# Patient Record
Sex: Male | Born: 1945
Health system: Southern US, Community
[De-identification: ages and names within clinical notes are randomized; demographics above are authoritative.]

## PROBLEM LIST (undated history)

## (undated) DIAGNOSIS — G600 Hereditary motor and sensory neuropathy: Secondary | ICD-10-CM

## (undated) DIAGNOSIS — K5730 Diverticulosis of large intestine without perforation or abscess without bleeding: Secondary | ICD-10-CM

## (undated) DIAGNOSIS — R972 Elevated prostate specific antigen [PSA]: Secondary | ICD-10-CM

## (undated) DIAGNOSIS — E119 Type 2 diabetes mellitus without complications: Secondary | ICD-10-CM

## (undated) DIAGNOSIS — I2510 Atherosclerotic heart disease of native coronary artery without angina pectoris: Secondary | ICD-10-CM

## (undated) DIAGNOSIS — Z860100 Personal history of colon polyps, unspecified: Secondary | ICD-10-CM

## (undated) DIAGNOSIS — E7800 Pure hypercholesterolemia, unspecified: Secondary | ICD-10-CM

## (undated) DIAGNOSIS — G4730 Sleep apnea, unspecified: Secondary | ICD-10-CM

## (undated) DIAGNOSIS — N400 Enlarged prostate without lower urinary tract symptoms: Secondary | ICD-10-CM

## (undated) DIAGNOSIS — E669 Obesity, unspecified: Secondary | ICD-10-CM

## (undated) DIAGNOSIS — S83209A Unspecified tear of unspecified meniscus, current injury, unspecified knee, initial encounter: Secondary | ICD-10-CM

## (undated) DIAGNOSIS — Z8661 Personal history of infections of the central nervous system: Secondary | ICD-10-CM

## (undated) DIAGNOSIS — K219 Gastro-esophageal reflux disease without esophagitis: Secondary | ICD-10-CM

## (undated) DIAGNOSIS — I10 Essential (primary) hypertension: Secondary | ICD-10-CM

## (undated) HISTORY — DX: Obesity, unspecified: E66.9

## (undated) HISTORY — DX: Type 2 diabetes mellitus without complications: E11.9

## (undated) HISTORY — DX: Personal history of infections of the central nervous system: Z86.61

## (undated) HISTORY — PX: UPPER GASTROINTESTINAL ENDOSCOPY: SHX188

## (undated) HISTORY — PX: CHOLECYSTECTOMY: SHX55

## (undated) HISTORY — DX: Personal history of colon polyps, unspecified: Z86.0100

## (undated) HISTORY — DX: Elevated prostate specific antigen (PSA): R97.20

## (undated) HISTORY — DX: Gastro-esophageal reflux disease without esophagitis: K21.9

## (undated) HISTORY — DX: Hereditary motor and sensory neuropathy: G60.0

## (undated) HISTORY — DX: Pure hypercholesterolemia, unspecified: E78.00

## (undated) HISTORY — DX: Personal history of colonic polyps: Z86.010

## (undated) HISTORY — DX: Atherosclerotic heart disease of native coronary artery without angina pectoris: I25.10

## (undated) HISTORY — DX: Diverticulosis of large intestine without perforation or abscess without bleeding: K57.30

## (undated) HISTORY — PX: POLYPECTOMY: SHX149

## (undated) HISTORY — DX: Benign prostatic hyperplasia without lower urinary tract symptoms: N40.0

## (undated) HISTORY — DX: Sleep apnea, unspecified: G47.30

## (undated) HISTORY — DX: Essential (primary) hypertension: I10

---

## 1985-04-05 DIAGNOSIS — Z8661 Personal history of infections of the central nervous system: Secondary | ICD-10-CM

## 1985-04-05 HISTORY — DX: Personal history of infections of the central nervous system: Z86.61

## 2000-03-14 ENCOUNTER — Other Ambulatory Visit: Admission: RE | Admit: 2000-03-14 | Discharge: 2000-03-14 | Payer: Self-pay | Admitting: Gastroenterology

## 2000-07-25 ENCOUNTER — Other Ambulatory Visit: Admission: RE | Admit: 2000-07-25 | Discharge: 2000-07-25 | Payer: Self-pay | Admitting: Urology

## 2002-12-20 ENCOUNTER — Ambulatory Visit (HOSPITAL_COMMUNITY): Admission: RE | Admit: 2002-12-20 | Discharge: 2002-12-20 | Payer: Self-pay | Admitting: *Deleted

## 2002-12-23 ENCOUNTER — Ambulatory Visit (HOSPITAL_BASED_OUTPATIENT_CLINIC_OR_DEPARTMENT_OTHER): Admission: RE | Admit: 2002-12-23 | Discharge: 2002-12-23 | Payer: Self-pay | Admitting: Pulmonary Disease

## 2002-12-23 ENCOUNTER — Encounter: Payer: Self-pay | Admitting: Pulmonary Disease

## 2003-10-08 ENCOUNTER — Ambulatory Visit (HOSPITAL_COMMUNITY): Admission: RE | Admit: 2003-10-08 | Discharge: 2003-10-08 | Payer: Self-pay | Admitting: Gastroenterology

## 2004-02-04 HISTORY — PX: COLONOSCOPY: SHX174

## 2004-02-19 ENCOUNTER — Observation Stay (HOSPITAL_COMMUNITY): Admission: RE | Admit: 2004-02-19 | Discharge: 2004-02-19 | Payer: Self-pay | Admitting: Surgery

## 2004-02-24 ENCOUNTER — Encounter: Admission: RE | Admit: 2004-02-24 | Discharge: 2004-02-24 | Payer: Self-pay | Admitting: Surgery

## 2004-06-09 ENCOUNTER — Ambulatory Visit: Payer: Self-pay | Admitting: Pulmonary Disease

## 2004-11-10 ENCOUNTER — Ambulatory Visit: Payer: Self-pay | Admitting: Pulmonary Disease

## 2004-11-30 ENCOUNTER — Ambulatory Visit: Payer: Self-pay | Admitting: Pulmonary Disease

## 2005-05-12 ENCOUNTER — Ambulatory Visit: Payer: Self-pay | Admitting: Pulmonary Disease

## 2005-05-28 ENCOUNTER — Ambulatory Visit: Payer: Self-pay | Admitting: Pulmonary Disease

## 2005-06-21 ENCOUNTER — Ambulatory Visit: Payer: Self-pay | Admitting: Pulmonary Disease

## 2005-08-04 ENCOUNTER — Encounter: Payer: Self-pay | Admitting: Internal Medicine

## 2005-08-04 ENCOUNTER — Ambulatory Visit (HOSPITAL_BASED_OUTPATIENT_CLINIC_OR_DEPARTMENT_OTHER): Admission: RE | Admit: 2005-08-04 | Discharge: 2005-08-04 | Payer: Self-pay | Admitting: Internal Medicine

## 2005-08-08 ENCOUNTER — Ambulatory Visit: Payer: Self-pay | Admitting: Internal Medicine

## 2005-08-25 ENCOUNTER — Ambulatory Visit: Payer: Self-pay | Admitting: Internal Medicine

## 2005-11-05 ENCOUNTER — Ambulatory Visit: Payer: Self-pay | Admitting: Internal Medicine

## 2005-12-13 ENCOUNTER — Ambulatory Visit: Payer: Self-pay | Admitting: Internal Medicine

## 2007-04-06 HISTORY — PX: TRIGGER FINGER RELEASE: SHX641

## 2007-06-14 DIAGNOSIS — N401 Enlarged prostate with lower urinary tract symptoms: Secondary | ICD-10-CM

## 2007-06-14 DIAGNOSIS — K219 Gastro-esophageal reflux disease without esophagitis: Secondary | ICD-10-CM | POA: Insufficient documentation

## 2007-06-14 DIAGNOSIS — I10 Essential (primary) hypertension: Secondary | ICD-10-CM | POA: Insufficient documentation

## 2007-06-14 DIAGNOSIS — N138 Other obstructive and reflux uropathy: Secondary | ICD-10-CM | POA: Insufficient documentation

## 2007-06-14 DIAGNOSIS — M1990 Unspecified osteoarthritis, unspecified site: Secondary | ICD-10-CM | POA: Insufficient documentation

## 2007-06-15 ENCOUNTER — Ambulatory Visit: Payer: Self-pay | Admitting: Pulmonary Disease

## 2007-06-15 DIAGNOSIS — D126 Benign neoplasm of colon, unspecified: Secondary | ICD-10-CM | POA: Insufficient documentation

## 2007-06-15 DIAGNOSIS — K5730 Diverticulosis of large intestine without perforation or abscess without bleeding: Secondary | ICD-10-CM | POA: Insufficient documentation

## 2007-06-15 DIAGNOSIS — G4733 Obstructive sleep apnea (adult) (pediatric): Secondary | ICD-10-CM | POA: Insufficient documentation

## 2007-06-15 DIAGNOSIS — I2510 Atherosclerotic heart disease of native coronary artery without angina pectoris: Secondary | ICD-10-CM | POA: Insufficient documentation

## 2007-06-15 DIAGNOSIS — H6690 Otitis media, unspecified, unspecified ear: Secondary | ICD-10-CM | POA: Insufficient documentation

## 2007-06-23 LAB — CONVERTED CEMR LAB
ALT: 30 units/L (ref 0–53)
AST: 27 units/L (ref 0–37)
Albumin: 4.1 g/dL (ref 3.5–5.2)
Alkaline Phosphatase: 68 units/L (ref 39–117)
BUN: 13 mg/dL (ref 6–23)
Basophils Absolute: 0 10*3/uL (ref 0.0–0.1)
Basophils Relative: 0.7 % (ref 0.0–1.0)
Bilirubin, Direct: 0.1 mg/dL (ref 0.0–0.3)
CO2: 30 meq/L (ref 19–32)
Calcium: 9.3 mg/dL (ref 8.4–10.5)
Chloride: 102 meq/L (ref 96–112)
Cholesterol: 188 mg/dL (ref 0–200)
Creatinine, Ser: 0.9 mg/dL (ref 0.4–1.5)
Eosinophils Absolute: 0.3 10*3/uL (ref 0.0–0.6)
Eosinophils Relative: 3.8 % (ref 0.0–5.0)
GFR calc Af Amer: 110 mL/min
GFR calc non Af Amer: 91 mL/min
Glucose, Bld: 112 mg/dL — ABNORMAL HIGH (ref 70–99)
HCT: 42.6 % (ref 39.0–52.0)
HDL: 32.7 mg/dL — ABNORMAL LOW (ref >39.0)
Hemoglobin: 14.5 g/dL (ref 13.0–17.0)
Hgb A1c MFr Bld: 6.1 % — ABNORMAL HIGH (ref 4.6–6.0)
LDL Cholesterol: 133 mg/dL — ABNORMAL HIGH (ref 0–99)
Lymphocytes Relative: 26.5 % (ref 12.0–46.0)
MCHC: 34 g/dL (ref 30.0–36.0)
MCV: 90.6 fL (ref 78.0–100.0)
Monocytes Absolute: 0.6 10*3/uL (ref 0.2–0.7)
Monocytes Relative: 8.2 % (ref 3.0–11.0)
Neutro Abs: 4.2 10*3/uL (ref 1.4–7.7)
Neutrophils Relative %: 60.8 % (ref 43.0–77.0)
Platelets: 275 10*3/uL (ref 150–400)
Potassium: 4.3 meq/L (ref 3.5–5.1)
RBC: 4.7 M/uL (ref 4.22–5.81)
RDW: 12.2 % (ref 11.5–14.6)
Sodium: 138 meq/L (ref 135–145)
TSH: 0.84 microintl units/mL (ref 0.35–5.50)
Total Bilirubin: 0.8 mg/dL (ref 0.3–1.2)
Total CHOL/HDL Ratio: 5.7
Total Protein: 7.1 g/dL (ref 6.0–8.3)
Triglycerides: 111 mg/dL (ref 0–149)
VLDL: 22 mg/dL (ref 0–40)
WBC: 6.9 10*3/uL (ref 4.5–10.5)

## 2007-06-28 ENCOUNTER — Ambulatory Visit (HOSPITAL_BASED_OUTPATIENT_CLINIC_OR_DEPARTMENT_OTHER): Admission: RE | Admit: 2007-06-28 | Discharge: 2007-06-28 | Payer: Self-pay | Admitting: Orthopedic Surgery

## 2008-02-05 ENCOUNTER — Telehealth (INDEPENDENT_AMBULATORY_CARE_PROVIDER_SITE_OTHER): Payer: Self-pay | Admitting: *Deleted

## 2008-04-30 ENCOUNTER — Ambulatory Visit: Payer: Self-pay | Admitting: Family Medicine

## 2008-04-30 DIAGNOSIS — G009 Bacterial meningitis, unspecified: Secondary | ICD-10-CM | POA: Insufficient documentation

## 2008-05-06 LAB — CONVERTED CEMR LAB
ALT: 26 units/L (ref 0–53)
AST: 26 units/L (ref 0–37)
Albumin: 4 g/dL (ref 3.5–5.2)
Alkaline Phosphatase: 52 units/L (ref 39–117)
BUN: 16 mg/dL (ref 6–23)
Basophils Absolute: 0 10*3/uL (ref 0.0–0.1)
Basophils Relative: 0.3 % (ref 0.0–3.0)
Bilirubin, Direct: 0.1 mg/dL (ref 0.0–0.3)
CO2: 32 meq/L (ref 19–32)
Calcium: 9.4 mg/dL (ref 8.4–10.5)
Chloride: 103 meq/L (ref 96–112)
Cholesterol: 183 mg/dL (ref 0–200)
Creatinine, Ser: 1 mg/dL (ref 0.4–1.5)
Eosinophils Absolute: 0.3 10*3/uL (ref 0.0–0.7)
Eosinophils Relative: 5.3 % — ABNORMAL HIGH (ref 0.0–5.0)
GFR calc Af Amer: 97 mL/min
GFR calc non Af Amer: 80 mL/min
Glucose, Bld: 111 mg/dL — ABNORMAL HIGH (ref 70–99)
HCT: 41.2 % (ref 39.0–52.0)
HDL: 33.7 mg/dL — ABNORMAL LOW (ref >39.0)
Hemoglobin: 14.5 g/dL (ref 13.0–17.0)
Hgb A1c MFr Bld: 6.2 % — ABNORMAL HIGH (ref 4.6–6.0)
LDL Cholesterol: 121 mg/dL — ABNORMAL HIGH (ref 0–99)
Lymphocytes Relative: 29.5 % (ref 12.0–46.0)
MCHC: 35.1 g/dL (ref 30.0–36.0)
MCV: 91.2 fL (ref 78.0–100.0)
Monocytes Absolute: 0.6 10*3/uL (ref 0.1–1.0)
Monocytes Relative: 9.5 % (ref 3.0–12.0)
Neutro Abs: 3.4 10*3/uL (ref 1.4–7.7)
Neutrophils Relative %: 55.4 % (ref 43.0–77.0)
PSA: 4.09 ng/mL — ABNORMAL HIGH (ref 0.10–4.00)
Platelets: 250 10*3/uL (ref 150–400)
Potassium: 4.2 meq/L (ref 3.5–5.1)
RBC: 4.52 M/uL (ref 4.22–5.81)
RDW: 12.5 % (ref 11.5–14.6)
Sodium: 141 meq/L (ref 135–145)
TSH: 0.87 microintl units/mL (ref 0.35–5.50)
Total Bilirubin: 0.6 mg/dL (ref 0.3–1.2)
Total CHOL/HDL Ratio: 5.4
Total Protein: 6.9 g/dL (ref 6.0–8.3)
Triglycerides: 142 mg/dL (ref 0–149)
Uric Acid, Serum: 7.6 mg/dL (ref 4.0–7.8)
VLDL: 28 mg/dL (ref 0–40)
WBC: 6.1 10*3/uL (ref 4.5–10.5)

## 2008-05-07 ENCOUNTER — Ambulatory Visit: Payer: Self-pay | Admitting: Family Medicine

## 2008-05-14 ENCOUNTER — Ambulatory Visit: Payer: Self-pay | Admitting: Family Medicine

## 2008-05-17 ENCOUNTER — Encounter: Payer: Self-pay | Admitting: Family Medicine

## 2008-05-21 ENCOUNTER — Ambulatory Visit: Payer: Self-pay | Admitting: Family Medicine

## 2008-05-28 ENCOUNTER — Ambulatory Visit: Payer: Self-pay | Admitting: Family Medicine

## 2008-06-04 ENCOUNTER — Ambulatory Visit: Payer: Self-pay | Admitting: Family Medicine

## 2008-06-11 ENCOUNTER — Ambulatory Visit: Payer: Self-pay | Admitting: Family Medicine

## 2008-06-18 ENCOUNTER — Ambulatory Visit: Payer: Self-pay | Admitting: Family Medicine

## 2008-06-25 ENCOUNTER — Ambulatory Visit: Payer: Self-pay | Admitting: Family Medicine

## 2008-07-09 ENCOUNTER — Ambulatory Visit: Payer: Self-pay | Admitting: Family Medicine

## 2008-07-30 ENCOUNTER — Ambulatory Visit: Payer: Self-pay | Admitting: Family Medicine

## 2008-08-02 ENCOUNTER — Encounter (INDEPENDENT_AMBULATORY_CARE_PROVIDER_SITE_OTHER): Payer: Self-pay | Admitting: *Deleted

## 2008-08-05 ENCOUNTER — Telehealth (INDEPENDENT_AMBULATORY_CARE_PROVIDER_SITE_OTHER): Payer: Self-pay | Admitting: *Deleted

## 2008-09-11 ENCOUNTER — Ambulatory Visit: Payer: Self-pay | Admitting: Family Medicine

## 2008-09-30 ENCOUNTER — Encounter: Payer: Self-pay | Admitting: Family Medicine

## 2008-10-15 ENCOUNTER — Ambulatory Visit: Payer: Self-pay | Admitting: Family Medicine

## 2008-11-12 ENCOUNTER — Encounter: Payer: Self-pay | Admitting: Family Medicine

## 2008-11-13 ENCOUNTER — Ambulatory Visit: Payer: Self-pay | Admitting: Family Medicine

## 2009-04-08 ENCOUNTER — Ambulatory Visit: Payer: Self-pay | Admitting: Family Medicine

## 2009-04-09 ENCOUNTER — Telehealth: Payer: Self-pay | Admitting: Family Medicine

## 2009-04-16 ENCOUNTER — Encounter: Payer: Self-pay | Admitting: Family Medicine

## 2009-04-22 ENCOUNTER — Ambulatory Visit: Payer: Self-pay | Admitting: Family Medicine

## 2009-04-23 ENCOUNTER — Telehealth (INDEPENDENT_AMBULATORY_CARE_PROVIDER_SITE_OTHER): Payer: Self-pay | Admitting: *Deleted

## 2009-04-24 ENCOUNTER — Encounter (INDEPENDENT_AMBULATORY_CARE_PROVIDER_SITE_OTHER): Payer: Self-pay | Admitting: *Deleted

## 2009-04-29 ENCOUNTER — Ambulatory Visit: Payer: Self-pay | Admitting: Gastroenterology

## 2009-04-29 ENCOUNTER — Ambulatory Visit: Payer: Self-pay | Admitting: Family Medicine

## 2009-05-19 ENCOUNTER — Ambulatory Visit: Payer: Self-pay | Admitting: Gastroenterology

## 2009-05-26 ENCOUNTER — Telehealth: Payer: Self-pay | Admitting: Family Medicine

## 2009-06-17 ENCOUNTER — Ambulatory Visit: Payer: Self-pay | Admitting: Family Medicine

## 2009-06-24 ENCOUNTER — Ambulatory Visit: Payer: Self-pay | Admitting: Family Medicine

## 2009-07-01 ENCOUNTER — Ambulatory Visit: Payer: Self-pay | Admitting: Family Medicine

## 2009-09-08 ENCOUNTER — Telehealth: Payer: Self-pay | Admitting: Pulmonary Disease

## 2009-09-09 ENCOUNTER — Ambulatory Visit: Payer: Self-pay | Admitting: Pulmonary Disease

## 2009-09-22 ENCOUNTER — Ambulatory Visit: Payer: Self-pay | Admitting: Pulmonary Disease

## 2009-09-28 LAB — CONVERTED CEMR LAB
ALT: 19 units/L (ref 0–53)
AST: 24 units/L (ref 0–37)
Albumin: 4.1 g/dL (ref 3.5–5.2)
Alkaline Phosphatase: 60 units/L (ref 39–117)
BUN: 13 mg/dL (ref 6–23)
Basophils Absolute: 0 10*3/uL (ref 0.0–0.1)
Basophils Relative: 0.7 % (ref 0.0–3.0)
Bilirubin, Direct: 0.1 mg/dL (ref 0.0–0.3)
CO2: 32 meq/L (ref 19–32)
Calcium: 9.6 mg/dL (ref 8.4–10.5)
Chloride: 104 meq/L (ref 96–112)
Cholesterol: 179 mg/dL (ref 0–200)
Creatinine, Ser: 0.8 mg/dL (ref 0.4–1.5)
Eosinophils Absolute: 0.2 10*3/uL (ref 0.0–0.7)
Eosinophils Relative: 4.4 % (ref 0.0–5.0)
GFR calc non Af Amer: 103.25 mL/min (ref >60)
Glucose, Bld: 95 mg/dL (ref 70–99)
HCT: 40.4 % (ref 39.0–52.0)
HDL: 41.1 mg/dL (ref >39.00)
Hemoglobin: 14 g/dL (ref 13.0–17.0)
LDL Cholesterol: 119 mg/dL — ABNORMAL HIGH (ref 0–99)
Lymphocytes Relative: 29.7 % (ref 12.0–46.0)
Lymphs Abs: 1.7 10*3/uL (ref 0.7–4.0)
MCHC: 34.5 g/dL (ref 30.0–36.0)
MCV: 93 fL (ref 78.0–100.0)
Monocytes Absolute: 0.5 10*3/uL (ref 0.1–1.0)
Monocytes Relative: 8.4 % (ref 3.0–12.0)
Neutro Abs: 3.2 10*3/uL (ref 1.4–7.7)
Neutrophils Relative %: 56.8 % (ref 43.0–77.0)
PSA: 3.77 ng/mL (ref 0.10–4.00)
Platelets: 194 10*3/uL (ref 150.0–400.0)
Potassium: 4.8 meq/L (ref 3.5–5.1)
RBC: 4.35 M/uL (ref 4.22–5.81)
RDW: 12.9 % (ref 11.5–14.6)
Sodium: 142 meq/L (ref 135–145)
TSH: 1.24 microintl units/mL (ref 0.35–5.50)
Total Bilirubin: 0.7 mg/dL (ref 0.3–1.2)
Total CHOL/HDL Ratio: 4
Total Protein: 6.6 g/dL (ref 6.0–8.3)
Triglycerides: 93 mg/dL (ref 0.0–149.0)
VLDL: 18.6 mg/dL (ref 0.0–40.0)
WBC: 5.7 10*3/uL (ref 4.5–10.5)

## 2009-10-08 ENCOUNTER — Encounter: Payer: Self-pay | Admitting: Pulmonary Disease

## 2009-10-22 ENCOUNTER — Telehealth: Payer: Self-pay | Admitting: Gastroenterology

## 2009-10-22 ENCOUNTER — Encounter: Payer: Self-pay | Admitting: Pulmonary Disease

## 2010-05-05 NOTE — Progress Notes (Signed)
  Phone Note Refill Request   Refills Requested: Medication #1:  LISINOPRIL 10 MG TABS 1 by mouth once daily    New/Updated Medications: LISINOPRIL 20 MG TABS (LISINOPRIL) 1 by mouth daily. Prescriptions: LISINOPRIL 20 MG TABS (LISINOPRIL) 1 by mouth daily.  #30 x 0   Entered by:   Army Fossa CMA   Authorized by:   Loreen Freud DO   Signed by:   Army Fossa CMA on 04/23/2009   Method used:   Electronically to        [facility]. [phone]* (retail)       [address]       [place], [place]  [address]       Ph: [phone]       Fax: [phone]   RxID:   [id number]

## 2010-05-05 NOTE — Procedures (Addendum)
Summary: Colonoscopy  Patient: Ralph Garrett Note: All result statuses are Final unless otherwise noted.  Tests: (1) Colonoscopy (COL)   COL Colonoscopy           DONE     [facility]     [address]     [place], [place]  [address]           COLONOSCOPY PROCEDURE REPORT           PATIENT:  [name], [name]  MR#:  [id number]     BIRTHDATE:  [birth year], 63 yrs. old  GENDER:  male           ENDOSCOPIST:  Vania Rea. Jarold Motto, MD, [facility]     Referred by:           PROCEDURE DATE:  05/19/2009     PROCEDURE:  Colonoscopy with snare polypectomy     ASA CLASS:  Class III     INDICATIONS:  history of pre-cancerous (adenomatous) colon polyps                 MEDICATIONS:   Fentanyl 50 mcg IV, Versed 7 mg IV           DESCRIPTION OF PROCEDURE:   After the risks benefits and     alternatives of the procedure were thoroughly explained, informed     consent was obtained.  Digital rectal exam was performed and     revealed no abnormalities.   The LB [id number] endoscope     was introduced through the anus and advanced to the cecum, which     was identified by both the appendix and ileocecal valve, limited     by poor preparation.  EXTREMELY POOR PREP.OVER1 LITER OF STOOL     ASPIRATED AND ANOTHER LITER PRESENT.  The quality of the prep was     poor, using MoviPrep.  The instrument was then slowly withdrawn as     the colon was fully examined.     <<PROCEDUREIMAGES>>           FINDINGS:  ENDOSCOPIC FINDINGS:   A sessile polyp was found at the     hepatic flexure.  CM FLAT POLYP SNARE EXCISED AND LOST IN POOR     PREP. Severe diverticulosis was found sigmoid to descending     Retroflexed views in the rectum revealed no abnormalities.    The     scope was then withdrawn from the patient and the procedure     completed.           COMPLICATIONS:  None           ENDOSCOPIC IMPRESSION:     1) Severe diverticulosis in the sigmoid to descending     2) Sessile polyp at the hepatic  flexure     LESS THAN 50% CONFIDENCE VISUALIZATION RELIANCE.     RECOMMENDATIONS:     1) high fiber diet     F/U 1 YEAR WITH DOUBLE PREP.           REPEAT EXAM:  No           ______________________________     Vania Rea. Jarold Motto, MD, [facility]           CC:           n.     eSIGNED:   Vania Rea. Patterson at 05/19/2009 10:20 AM           Dennison Nancy, [phone]  Note: An  exclamation mark (!) indicates a result that was not dispersed into the flowsheet. Document Creation Date: 05/19/2009 10:20 AM _______________________________________________________________________  (1) Order result status: Final Collection or observation date-time: 05/19/2009 10:12 Requested date-time:  Receipt date-time:  Reported date-time:  Referring Physician:   Ordering Physician: Sheryn Bison [phone]) Specimen Source:  Source: Launa Grill Order Number: [id number] Lab site:   Appended Document: Colonoscopy     Procedures Next Due Date:    Colonoscopy: 05/2010  Appended Document: Colonoscopy he needs colonoscopy with osmo...prep  Appended Document: Colonoscopy spoke to wife and she will have him call back to schedule the colonoscopy...Marland Kitchenhe will need osmo prep.

## 2010-05-05 NOTE — Progress Notes (Signed)
Summary: lab & rx  Phone Note Call from Patient Call back at Home Phone [phone]   Caller: Patient Call For: Lameisha Schuenemann Reason for Call: Talk to Nurse Summary of Call: pt wants to come in for bloodwork.  Has appt schedulled for 06/20.  Pt would like to come in 06/07.  Also, need rx for his Lisinopril called in  to [facility] Initial call taken by: Eugene Gavia,  September 08, 2009 11:02 AM  Follow-up for Phone Call        Please advise on labs to enter. Carron Curie CMA  September 08, 2009 11:54 AM   per SN---ok for labs---lip-272.0/bmp-401.9/hepat-790.5/cbcd-285.9/tsh-244.9/psa-v76.44.  labs are in computer for pt to come in on 6-7 per pts request.  called and spoke with pt and he is aware and we sent in refill of the lisinopril to last until his appt with SN. Randell Loop CMA  September 08, 2009 1:45 PM     Prescriptions: LISINOPRIL 20 MG TABS (LISINOPRIL) 1 by mouth daily. NEEDS OFFICE VISIT BEFORE ADDITIONAL REFILLS. Brand medically necessary #30 Tablet x 0   Entered by:   Randell Loop CMA   Authorized by:   Michele Mcalpine MD   Signed by:   Randell Loop CMA on 09/08/2009   Method used:   Electronically to        [facility]. [phone]* (retail)       [address]       [place], [place]  [address]       Ph: [phone]       Fax: [phone]   RxID:   [id number]

## 2010-05-05 NOTE — Progress Notes (Signed)
Summary: EGD recall project   ---- Converted from flag ---- ---- 10/22/2009 12:25 PM, Mardella Layman MD [facility] wrote: cx egd  ---- 10/22/2009 11:39 AM, Harlow Mares CMA (AAMA) wrote: Patient has a recall EGD in the system from 2010, I see you did a colonoscopy on him in 2011 without mention of doing an EGD. Do you wish for him to have an EGD still?  He is part of the recall project  leisha ------------------------------

## 2010-05-05 NOTE — Assessment & Plan Note (Signed)
Summary: cpx/ mbw   Primary Care Provider:  Kriste Basque  CC:  2 year ROV & CPX....  History of Present Illness: 65 y/o WM here for a follow up visit and CPX...   ~  September 22, 2009:  last seen 3/09  but he participated in the Optifast program 2010 w/ CPX DrLowne at that time> he lost 52# from 307# to 255# & has kept it off... he has since had some problems w/ his CPAP & I suspect interface issues due to wt loss (I have encouraged f/u w/ DrYoung- he may need repeat split night study)... BP controlled on meds;  Chol improved w/ weight reduction;  followed by Urology- DrPeterson Q107mo on Avodart;  finally he notes prob CMT neuropathy w/ some feet & legs symptoms that run in the family...   Current Problems:  Hx of OTITIS MEDIA (ICD-382.9) - hospitalized in 1987 w/ a URI, OM, and meningitis secondary to strep pneumoniae... left myringotomy tube placed by DrKraus...  OBSTRUCTIVE SLEEP APNEA (ICD-327.23) - sleep study 5/07 w/ RDI=90 & desat to 81%... sleep consult by DrYoung, stable on CPAP 14, nasal mask... he has not been successful in weight loss attempts.  HYPERTENSION (ICD-401.9) - controlled on LISINOPRIL 20mg /d... BP=110/72, doing well... denies HA, fatigue, visual changes, CP, palipit, dizziness, syncope, dyspnea, edema, etc...  CORONARY ARTERY DISEASE (ICD-414.00) - known non-obstructive CAD w/ cath 9/04 (after a non-dx Cardiolite) showing 20-30% lesions only & EF=55-60%.  HYPERCHOLESTEROLEMIA (ICD-272.0) - on diet Rx alone...  ~  FLP 2/07 showed TChol 180, TG 104, HDL 33, LDL 126  ~  FLP 3/09 (wt=307#) showed TChol 188, TG 111, HDL 33, LDL 133  ~  FLP 6/11 (wt=255#) showed TChol 179, TG 93, HDL 41, LDL 119... continue diet efforts.  OBESITY (ICD-278.00) - prev weight up to 307# in his 6'1" frame for a BMI=40-41  ~  weight 6/11 = 255# after Optifast program in 2010  GERD (ICD-530.81) - EGD 8/05 w/ gastritis... he had gallstones too at that time and improved post  cholecystectomy...  DIVERTICULOSIS OF COLON (ICD-562.10) - colonoscopy was 6/05 DrPatterson showing divertics & polyps (hyperplastic)... last tubular adenoma removed 2001... f/u colonoscopy 2/11 showed divertics & sessile polyp, poor prep. COLONIC POLYPS (ICD-211.3)  BENIGN PROSTATIC HYPERTROPHY, HX OF (ICD-V13.8) - Eval and Rx by DrPeterson w/ biopsiesx4 in the past were neg per pt's history and large prostate treated w/ AVODART... urology checks him Q15months... PSA, INCREASED (ICD-790.93)  DEGENERATIVE JOINT DISEASE (ICD-715.90)  Hx of BACTERIAL MENINGITIS (ICD-320.9) - he had suppurative OM w/ meningitis in 1987 & made a full recovery...  R/O PERONEAL MUSCULAR ATROPHY (ICD-356.1) - high arches and ? FamHx for poss CMT neuropathy... denies motor problems, difficulty w/ ambulations, falling, etc...   Preventive Screening-Counseling & Management  Alcohol-Tobacco     Smoking Status: quit     Year Quit: 1996  Allergies: 1)  ! Penicillin  Comments:  Nurse/Medical Assistant: The patient's medications and allergies were reviewed with the patient and were updated in the Medication and Allergy Lists.  Past History:  Past Medical History: Hx of OTITIS MEDIA (ICD-382.9) OBSTRUCTIVE SLEEP APNEA (ICD-327.23) HYPERTENSION (ICD-401.9) CORONARY ARTERY DISEASE (ICD-414.00) HYPERCHOLESTEROLEMIA (ICD-272.0) OBESITY (ICD-278.00) GERD (ICD-530.81) DIVERTICULOSIS OF COLON (ICD-562.10) COLONIC POLYPS (ICD-211.3) BENIGN PROSTATIC HYPERTROPHY, HX OF (ICD-V13.8) PSA, INCREASED (ICD-790.93) DEGENERATIVE JOINT DISEASE (ICD-715.90) Hx of BACTERIAL MENINGITIS (ICD-320.9) R/O PERONEAL MUSCULAR ATROPHY (ICD-356.1)  Past Surgical History: S/P laparoscopic cholecystectomy - 11/05 by DrStreck Trigger finger-R hand 2009  [place]  Family History: Reviewed  history from 04/30/2008 and no changes required. Family History of Stroke M 1st degree relative <50 Family History of CAD Male 1st degree  relative 57s Family History Lung cancer B at 66 yo brain tumor  Family History Diabetes 1st degree relative M-COPD Family History High cholesterol Family History Hypertension  Social History: Reviewed history from 04/30/2008 and no changes required. Occupation: Licensed conveyancer, traveling Married Former Smoker Alcohol use-yes Drug use-no Regular exercise-no  Review of Systems       The patient complains of sleep disorder.  The patient denies fever, chills, sweats, anorexia, fatigue, weakness, malaise, weight loss, blurring, diplopia, eye irritation, eye discharge, vision loss, eye pain, photophobia, earache, ear discharge, tinnitus, decreased hearing, nasal congestion, nosebleeds, sore throat, hoarseness, chest pain, palpitations, syncope, dyspnea on exertion, orthopnea, PND, peripheral edema, cough, dyspnea at rest, excessive sputum, hemoptysis, wheezing, pleurisy, nausea, vomiting, diarrhea, constipation, change in bowel habits, abdominal pain, melena, hematochezia, jaundice, gas/bloating, indigestion/heartburn, dysphagia, odynophagia, dysuria, hematuria, urinary frequency, urinary hesitancy, nocturia, incontinence, back pain, joint pain, joint swelling, muscle cramps, muscle weakness, stiffness, arthritis, sciatica, restless legs, leg pain at night, leg pain with exertion, rash, itching, dryness, suspicious lesions, paralysis, paresthesias, seizures, tremors, vertigo, transient blindness, frequent falls, frequent headaches, difficulty walking, depression, anxiety, memory loss, confusion, cold intolerance, heat intolerance, polydipsia, polyphagia, polyuria, unusual weight change, abnormal bruising, bleeding, enlarged lymph nodes, urticaria, allergic rash, hay fever, and recurrent infections.    Vital Signs:  Patient profile:   65 year old male Height:      74.5 inches Weight:      255 pounds BMI:     32.42 O2 Sat:      98 % on Room air Temp:     97.6 degrees F oral Pulse rate:   50  / minute BP sitting:   122 / 72  (left arm) Cuff size:   regular  Vitals Entered By: Randell Loop CMA (September 22, 2009 11:13 AM)  O2 Sat at Rest %:  98 O2 Flow:  Room air CC: 2 year ROV & CPX... Is Patient Diabetic? No Pain Assessment Patient in pain? yes      Onset of pain  feet hurt when waking barefoot on floors Comments meds updated today with pt   Physical Exam  Additional Exam:  WD, Obese, 65 y/o WM in NAD... GENERAL:  Alert & oriented; pleasant & cooperative... HEENT:  [place]/AT, EOM-wnl, PERRLA, Fundi-benign, EACs-clear, TMs-wnl, NOSE-clear, THROAT-clear & wnl. NECK:  Supple w/ full ROM; no JVD; normal carotid impulses w/o bruits; no thyromegaly or nodules palpated; no lymphadenopathy. CHEST:  Clear to P & A; without wheezes/ rales/ or rhonchi heard... HEART:  Regular Rhythm; without murmurs/ rubs/ or gallops detected... ABDOMEN:  Obese, soft & nontender; normal bowel sounds; no organomegaly or masses detected. EXT: without deformities, mild arthritic changes; no varicose veins/ +venous insuffic/ no edema. NEURO:  CN's intact; motor testing normal; sensory testing normal; gait normal & balance OK. DERM:  No lesions noted; no rash etc...    CXR  Procedure date:  09/22/2009  Findings:      CHEST - 2 VIEW Comparison: 06/15/2007.   Findings: Normal cardiomediastinal silhouette.  COPD. Emphysematous changes upper lung zones/apices.  Bony hypertrophic changes of the thoracic spine.Nodular opacity right paravertebral region of the mid chest compatible with bony hypertrophy at the costovertebral junction.   IMPRESSION: COPD.  No acute chest findings.  No interval change.   Read By:  Jonne Ply,  M.D.   EKG  Procedure date:  09/22/2009  Findings:      Sinus bradycardia with rate of:  54/min... Tracing shows LAD, IVCD, otherw neg...  SN   MISC. Report  Procedure date:  09/09/2009  Findings:      BMP (METABOL)   Sodium                    142 mEq/L                    135-145   Potassium                 4.8 mEq/L                   3.5-5.1   Chloride                  104 mEq/L                   96-112   Carbon Dioxide            32 mEq/L                    19-32   Glucose                   95 mg/dL                    04-54   BUN                       13 mg/dL                    0-98   Creatinine                0.8 mg/dL                   1.1-9.1   Calcium                   9.6 mg/dL                   4.7-82.9   GFR                       103.25 mL/min               >60  Lipid Panel (LIPID)   Cholesterol               179 mg/dL                   5-621   Triglycerides             93.0 mg/dL                  3.0-865.7   HDL                       84.69 mg/dL                 >62.95   LDL Cholesterol      [H]  284 mg/dL                   1-32   CBC Platelet w/Diff (CBCD)   White Cell Count          5.7 K/uL  4.5-10.5   Red Cell Count            4.35 Mil/uL                 4.22-5.81   Hemoglobin                14.0 g/dL                   57.8-46.9   Hematocrit                40.4 %                      39.0-52.0   MCV                       93.0 fl                     78.0-100.0   Platelet Count            194.0 K/uL                  150.0-400.0   Neutrophil %              56.8 %                      43.0-77.0   Lymphocyte %              29.7 %                      12.0-46.0   Monocyte %                8.4 %                       3.0-12.0   Eosinophils%              4.4 %                       0.0-5.0   Basophils %               0.7 %   Comments:      Hepatic/Liver Function Panel (HEPATIC)   Total Bilirubin           0.7 mg/dL                   6.2-9.5   Direct Bilirubin          0.1 mg/dL                   2.8-4.1   Alkaline Phosphatase      60 U/L                      39-117   AST                       24 U/L                      0-37   ALT                       19 U/L                      0-53  Total Protein              6.6 g/dL                    1.6-1.0   Albumin                   4.1 g/dL                    9.6-0.4   TSH (TSH)   FastTSH                   1.24 uIU/mL                 0.35-5.50  Prostate Specific Antigen (PSA)   PSA-Hyb                   3.77 ng/mL                  0.10-4.00   Impression & Recommendations:  Problem # 1:  OBSTRUCTIVE SLEEP APNEA (ICD-327.23)  Needs f/u Sleep eval ?split night study? new mask since he lost weight, etc...   Orders: DME Referral (DME)  Problem # 2:  HYPERTENSION (ICD-401.9) Controlled w/ Lisinopril & weight reduction... His updated medication list for this problem includes:    Lisinopril 20 Mg Tabs (Lisinopril) .Marland Kitchen... Take 1 tab by mouth once daily...  Problem # 3:  CORONARY ARTERY DISEASE (ICD-414.00) Continue risk factor reduction strategy... His updated medication list for this problem includes:    Bayer Low Strength 81 Mg Tbec (Aspirin) .Marland Kitchen... Take 1 tab once daily    Lisinopril 20 Mg Tabs (Lisinopril) .Marland Kitchen... Take 1 tab by mouth once daily...  Problem # 4:  HYPERCHOLESTEROLEMIA (ICD-272.0) Improved w/ weight reduction... discussed low chol/ low fat...  Problem # 5:  OBESITY (ICD-278.00) Great job keeping the weight off...  Problem # 6:  COLONIC POLYPS (ICD-211.3) He had colonoscopy 2/11> poor prep but +Divertics and sessile polyp... SEE REPORT.  Problem # 7:  BENIGN PROSTATIC HYPERTROPHY, HX OF (ICD-V13.8) Followed by DrPeterson Q50mo he says...  Problem # 8:  OTHER MEDICAL PROBLEMS AS NOTED>>>  Complete Medication List: 1)  Cpap 14 Cm  .... At bedtime 2)  Bayer Low Strength 81 Mg Tbec (Aspirin) .... Take 1 tab once daily 3)  Lisinopril 20 Mg Tabs (Lisinopril) .... Take 1 tab by mouth once daily.Marland KitchenMarland Kitchen 4)  Avodart 0.5 Mg Caps (Dutasteride) .... Take 1 tablet by mouth once a day 5)  Benadryl 25 Mg Caps (Diphenhydramine hcl) .... At bedtime  Other Orders: EKG w/ Interpretation (93000) T-2 View CXR (71020TC)  Patient Instructions: 1)   Today we updated your med list- see below.... 2)  We refilled your Lisinopril perscription.Marland KitchenMarland Kitchen 3)  Keep up the great job w/ weight reduction.Marland KitchenMarland Kitchen 4)  Today we did your follow up CXR & EKG... please call the "phone tree" in a few days for your lab results.Marland KitchenMarland Kitchen 5)  Call for any problems.Marland KitchenMarland Kitchen 6)  Please schedule a follow-up appointment in 1 year. Prescriptions: LISINOPRIL 20 MG TABS (LISINOPRIL) take 1 tab by mouth once daily... Brand medically necessary #90 x 4   Entered and Authorized by:   Michele Mcalpine MD   Signed by:   Michele Mcalpine MD on 09/22/2009   Method used:   Print then Give to Patient   RxID:   [id number]    [facility] ECG  ID: [id number] Patient: Ralph Garrett, Ralph Garrett DOB: 1945/07/30 Age:  65 Years Old Sex: Male Race: White Physician: Zilda No Technician: Randell Loop CMA Height: 74.5 Weight: 255 Status: Unconfirmed Past Medical History:   Hx of OTITIS MEDIA (ICD-382.9) OBSTRUCTIVE SLEEP APNEA (ICD-327.23) HYPERTENSION (ICD-401.9) CORONARY ARTERY DISEASE (ICD-414.00) HYPERCHOLESTEROLEMIA (ICD-272.0) OBESITY (ICD-278.00) GERD (ICD-530.81) DIVERTICULOSIS OF COLON (ICD-562.10) COLONIC POLYPS (ICD-211.3) BENIGN PROSTATIC HYPERTROPHY, HX OF (ICD-V13.8) PSA, INCREASED (ICD-790.93) DEGENERATIVE JOINT DISEASE (ICD-715.90)   Recorded: 09/22/2009 11:29 AM P/PR: 125 ms / 172 ms - Heart rate (maximum exercise) QRS: 125 QT/QTc/QTd: 436 ms / 426 ms / 38 ms - Heart rate (maximum exercise)  P/QRS/T axis: 54 deg / -76 deg / 36 deg - Heart rate (maximum exercise)  Heartrate: 54 bpm  Interpretation:  Sinus bradycardia with rate of:  54/min... Tracing shows LAD, IVCD, otherw neg...  SN

## 2010-05-05 NOTE — Progress Notes (Signed)
Summary: Refill Request  Phone Note Refill Request Message from:  Pharmacy on [facility] Fax #: [phone]  Refills Requested: Medication #1:  LISINOPRIL 20 MG TABS 1 by mouth daily.   Dosage confirmed as above?Dosage Confirmed   Supply Requested: 1 month   Last Refilled: 04/23/2009 Initial call taken by: Harold Barban,  May 26, 2009 8:51 AM  Follow-up for Phone Call        has not been seen by you since 04/30/08- but is doing optifast. Army Fossa CMA  May 26, 2009 9:06 AM   Additional Follow-up for Phone Call Additional follow up Details #1::        refill x1-- pt has not been seen in office since 04/2008--needs ov --optifast visit  is not the same Additional Follow-up by: Loreen Freud DO,  May 26, 2009 12:09 PM    New/Updated Medications: LISINOPRIL 20 MG TABS (LISINOPRIL) 1 by mouth daily. NEEDS OFFICE VISIT BEFORE ADDITIONAL REFILLS. Prescriptions: LISINOPRIL 20 MG TABS (LISINOPRIL) 1 by mouth daily. NEEDS OFFICE VISIT BEFORE ADDITIONAL REFILLS.  #30 x 0   Entered by:   Army Fossa CMA   Authorized by:   Loreen Freud DO   Signed by:   Army Fossa CMA on 05/26/2009   Method used:   Electronically to        [facility]. [phone]* (retail)       [address]       [place], [place]  [address]       Ph: [phone]       Fax: [phone]   RxID:   [id number]

## 2010-05-05 NOTE — Letter (Signed)
Summary: [facility] Instructions  [facility]  [address] [place], [place] [address]   Phone: [phone]  Fax: [phone]       Ralph Garrett    09/04/1945    MRN: [id number]        Procedure Day /Date:05/19/09  Monday     Arrival Time: 8:30am     Procedure Time:  9:30am     Location of Procedure:                    _x _   [facility]oscopy Center (4th Floor)                        PREPARATION FOR COLONOSCOPY WITH MOVIPREP   Starting 5 days prior to your procedure _2/9/11 _ do not eat nuts, seeds, popcorn, corn, beans, peas,  salads, or any raw vegetables.  Do not take any fiber supplements (e.g. Metamucil, Citrucel, and Benefiber).  THE DAY BEFORE YOUR PROCEDURE         DATE:  05/18/09  DAY:  Sunday  1.  Drink clear liquids the entire day-NO SOLID FOOD  2.  Do not drink anything colored red or purple.  Avoid juices with pulp.  No orange juice.  3.  Drink at least 64 oz. (8 glasses) of fluid/clear liquids during the day to prevent dehydration and help the prep work efficiently.  CLEAR LIQUIDS INCLUDE: Water Jello Ice Popsicles Tea (sugar ok, no milk/cream) Powdered fruit flavored drinks Coffee (sugar ok, no milk/cream) Gatorade Juice: apple, white grape, white cranberry  Lemonade Clear bullion, consomm, broth Carbonated beverages (any kind) Strained chicken noodle soup Hard Candy                             4.  In the morning, mix first dose of MoviPrep solution:    Empty 1 Pouch A and 1 Pouch B into the disposable container    Add lukewarm drinking water to the top line of the container. Mix to dissolve    Refrigerate (mixed solution should be used within 24 hrs)  5.  Begin drinking the prep at 5:00 p.m. The MoviPrep container is divided by 4 marks.   Every 15 minutes drink the solution down to the next mark (approximately 8 oz) until the full liter is complete.   6.  Follow completed prep with 16 oz of clear liquid of your choice (Nothing  red or purple).  Continue to drink clear liquids until bedtime.  7.  Before going to bed, mix second dose of MoviPrep solution:    Empty 1 Pouch A and 1 Pouch B into the disposable container    Add lukewarm drinking water to the top line of the container. Mix to dissolve    Refrigerate  THE DAY OF YOUR PROCEDURE      DATE:  05/19/09  DAY:  Monday  Beginning at 4:30 a.m. (5 hours before procedure):         1. Every 15 minutes, drink the solution down to the next mark (approx 8 oz) until the full liter is complete.  2. Follow completed prep with 16 oz. of clear liquid of your choice.    3. You may drink clear liquids until  7:30am  (2 HOURS BEFORE PROCEDURE).   MEDICATION INSTRUCTIONS  Unless otherwise instructed, you should take regular prescription medications with a small sip of water   as  early as possible the morning of your procedure.    Additional medication instructions: Be sure to take Lisinopril the morning of procedure.         OTHER INSTRUCTIONS  You will need a responsible adult at least 66 years of age to accompany you and drive you home.   This person must remain in the waiting room during your procedure.  Wear loose fitting clothing that is easily removed.  Leave jewelry and other valuables at home.  However, you may wish to bring a book to read or  an iPod/MP3 player to listen to music as you wait for your procedure to start.  Remove all body piercing jewelry and leave at home.  Total time from sign-in until discharge is approximately 2-3 hours.  You should go home directly after your procedure and rest.  You can resume normal activities the  day after your procedure.  The day of your procedure you should not:   Drive   Make legal decisions   Operate machinery   Drink alcohol   Return to work  You will receive specific instructions about eating, activities and medications before you leave.    The above instructions have been reviewed  and explained to me by  Wyona Almas RN  April 29, 2009 11:36 AM     I fully understand and can verbalize these instructions _____________________________ Date _________

## 2010-05-05 NOTE — Miscellaneous (Signed)
Summary: [facility] Previsit/prep  Clinical Lists Changes  Medications: Added new medication of MOVIPREP 100 GM  SOLR (PEG-KCL-NACL-NASULF-NA ASC-C) As per prep instructions. - Signed Rx of MOVIPREP 100 GM  SOLR (PEG-KCL-NACL-NASULF-NA ASC-C) As per prep instructions.;  #1 x 0;  Signed;  Entered by: Wyona Almas RN;  Authorized by: Mardella Layman MD [facility];  Method used: Electronically to [id number]*, [address] [place], [place], [place]  [address], Ph: [phone], Fax: [phone] Observations: Added new observation of ALLERGY REV: Done (04/29/2009 10:18)    Prescriptions: MOVIPREP 100 GM  SOLR (PEG-KCL-NACL-NASULF-NA ASC-C) As per prep instructions.  #1 x 0   Entered by:   Wyona Almas RN   Authorized by:   Mardella Layman MD [facility]   Signed by:   Wyona Almas RN on 04/29/2009   Method used:   Electronically to        [facility] [phone]* (retail)       [address]       [place], [place]  [address]       Ph: [phone]       Fax: [phone]   RxID:   [id number]

## 2010-05-05 NOTE — Miscellaneous (Signed)
Summary: Orders Update-Auto titration orders   Clinical Lists Changes  Orders: Added new Referral order of DME Referral (DME) - Signed      Mardelle Matte called the office stating that the pt was complaining of pressure issues with CPAP and machine is old. Mardelle Matte requested Auto Titration be done and SN gave verbal.[facility] CMA  October 08, 2009 3:50 PM

## 2010-05-05 NOTE — Progress Notes (Signed)
Summary: Lisinopril (lmom 1/5)  Phone Note Call from Patient   Summary of Call: Pt called and left a voicemail stating that you told him you would refill his Lisnopril, on 03/27/09 Dr.Nadel's office denied this med and stated he must make an appointment. We refilled the med on 11/12/2008. Is this fine to send in for him. Pharm- [facility].  Initial call taken by: Army Fossa CMA,  April 09, 2009 9:39 AM  Follow-up for Phone Call        I told him we would refill  --- pt should make f/u appoint here during day---his regular dr is Dr Lennon Alstrom but pt states he has been out of office--  please find out if he is back --if he is not pt needs to f/u here otherwise he can f/u with Dr Lennon Alstrom if he prefers. Follow-up by: Loreen Freud DO,  April 09, 2009 10:15 AM  Additional Follow-up for Phone Call Additional follow up Details #1::        Dr.Nadel is back in the office. I left a message for the pt to call us back. Army Fossa CMA  April 09, 2009 10:22 AM     Additional Follow-up for Phone Call Additional follow up Details #2::    pts wife is aware that meds are called in and pt needs to follow up.  Follow-up by: Army Fossa CMA,  April 09, 2009 12:20 PM  Prescriptions: LISINOPRIL 10 MG TABS (LISINOPRIL) 1 by mouth once daily  #30 x 2   Entered and Authorized by:   Loreen Freud DO   Signed by:   Loreen Freud DO on 04/09/2009   Method used:   Electronically to        [facility] [phone]* (retail)       [address]       [place], [place]  [address]       Ph: [phone]       Fax: [phone]   RxID:   [id number]

## 2010-05-20 ENCOUNTER — Encounter: Payer: Self-pay | Admitting: Gastroenterology

## 2010-05-27 NOTE — Letter (Signed)
Summary: Colonoscopy Letter  [facility]  [address]   [place], [place] [address]   Phone: [phone]  Fax: [phone]      May 20, 2010 MRN: [id number]   [facility] [address] [place], [place]  [address]   Dear Mr. Traynor,   According to your medical record, it is time for you to schedule a Colonoscopy. The American Cancer Society recommends this procedure as a method to detect early colon cancer. Patients with a family history of colon cancer, or a personal history of colon polyps or inflammatory bowel disease are at increased risk.  This letter has been generated based on the recommendations made at the time of your procedure. If you feel that in your particular situation this may no longer apply, please contact our office.  Please call our office at [phone] to schedule this appointment or to update your records at your earliest convenience.  Thank you for cooperating with [place] to provide you with the very best care possible.   Sincerely,   Sheryn Bison, M.D.  [facility] [phone]

## 2010-08-18 NOTE — Op Note (Signed)
Ralph Garrett, Ralph Garrett               ACCOUNT NO.:  [id number]   MEDICAL RECORD NO.:  [id number]          PATIENT TYPE:  AMB   LOCATION:  DSC                          FACILITY:  [facility]   PHYSICIAN:  Artist Pais. Weingold, M.D.DATE OF BIRTH:  [birth year]   DATE OF PROCEDURE:  06/28/2007  DATE OF DISCHARGE:                               OPERATIVE REPORT   PREOPERATIVE DIAGNOSIS:  Chronic right long finger stenosing  tenosynovitis.   POSTOPERATIVE DIAGNOSIS:  Chronic right long finger stenosing  tenosynovitis.   PROCEDURE:  Right long finger A1 pulley release.   SURGEON:  Artist Pais. Mina Marble, M.D.   ASSISTANT:  None.   ANESTHESIA:  Monitored anesthesia care with 0.25% plain Marcaine 5-mL  sheath block performed by the surgeon.   TOURNIQUET TIME:  10 minutes.   No complications.  No drains.   OPERATIVE REPORT:  The patient was taken to the operating suite after  the induction of adequate IV sedation.  The right upper extremity was  prepped and draped in a sterile fashion.  An Esmarch was used was  exsanguinate the limb.  Tourniquet was then inflated to 250 mmHg.  At  this point in time 5 mL of 0.25% plain Marcaine was injected into the A1  pulley area of the right long finger.  Once anesthesia was obtained, an  incision was made in the palmar crease in the A1 pulley obliquely.  Skin  was incised.  The neurovascular was carefully identified and retracted.  The proximal edge of the A1 pulley was identified and split with a 15  blade.  The FDS and FDP tendons were lysed of all adhesions.  The wound  was irrigated and loosely closed with 4-0 Vicryl Rapide suture.  Xeroform, 4x4s and a compressive wrap were applied.  The patient  tolerated the procedure well and went to recovery room in stable  fashion.      Artist Pais Mina Marble, M.D.  Electronically Signed     MAW/MEDQ  D:  06/28/2007  T:  06/28/2007  Job:  [id number]

## 2010-08-21 NOTE — Assessment & Plan Note (Signed)
[facility]                               PULMONARY OFFICE NOTE   JONERIC, STREIGHT                      MRN:          [id number]  DATE:12/13/2005                            DOB:          20-Jul-1945    PULMONARY SLEEP FOLLOWUP.   PROBLEM:  1. Obstructive sleep apnea.  2. Exogenous obesity.  3. Esophageal reflux.  4. Benign prostatic hypertrophy.   PRIMARY PHYSICIAN:  Lonzo Cloud. Kriste Basque, MD   HISTORY:  We turned his CPAP down to 14.  He still experiences more sneezing  if he wears his CPAP every night and we are not sure what the connection is.  He is very sensitive to air conditioning and drafts.  His wife wants the  room cooler and it is important enough that she now sleeps upstairs so that  he can avoid the air conditioning by sleeping downstairs.  He is comfortable  with the air conditioning off but sneezes when it is turned on, even in his  car.  He still rolls around in his sleep and wants to be able to sleep on  his back.  He sees that as the measure of success for CPAP but he admits he  feels more rested and he is able to wear the CPAP mask most of each night  now.  Astelin over-dried him and he disliked it.  Singulair has decreased  his rhinitis at least some.  Ipratropium nasal spray may have been some help  for watery nose on an occasional basis.   OBJECTIVE:  Weight 296 pounds, BP 126/72, pulse regular, 85, room air  saturation 96%.  He has not lost weight.  His nasal mucosa is normal.  There  are no pressure marks on his face from the mask.  I do not see evidence of  postnasal drainage or irritation.   IMPRESSION:  1. Obstructive sleep apnea under fair control with CPAP of 14.  2. Vasomotor rhinitis.   PLAN:  Will give him a prescription so that he can have his DRS home care  try one more pressure reduction down to a CPAP of 13 CWP.  If that is not  tolerated, they will go up to 14.  They can also try a chin strap.  We again  discussed comfort measures and weight loss.  He is at a point now where I  think he wants to be left alone to manage as well as he can so I have agreed  to see him in a year, earlier p.r.n.                                  Clinton D. Maple Hudson, MD, FCCP, FACP   CDY/MedQ  DD:  12/13/2005  DT:  12/14/2005  Job #:  [id number]

## 2010-08-21 NOTE — Assessment & Plan Note (Signed)
Ralph Garrett                               PULMONARY OFFICE NOTE   TARUS, BRISKI                      MRN:          [id number]  DATE:11/05/2005                            DOB:          06-18-1945    PROBLEM LIST:  1.  Obstructive sleep apnea.  2.  Exogenous obesity.  3.  Esophageal reflux.  4.  Benign prostatic hypertrophy.   HISTORY:  He has not had an easy path trying to get settled in with CPAP.  His pressure is at 15 CWP through [facility]. He has gone through three different  machines and four different masks.  His primary complaint is of watery nasal  congestion which he says was not a problem before he began CPAP.  It has not  been much affected by the humidifier.  He is using over-the-counter  loratadine. He admits there is less wake-up at night on CPAP and implies  that he is resting better and not being told that he snores through the  machine.   MEDICATIONS:  Aspirin 325 mg, lisinopril, Avodart, over-the-counter  Prilosec, CPAP 15 CWP, Rhinocort AQ,  loratadine or temazepam.   ALLERGIES:  Drug intolerance to PENICILLIN.   OBJECTIVE:  VITAL SIGNS:  Weight 293 pounds. Blood pressure 116/76, pulse  112, room air saturation 94%.  GENERAL APPEARANCE:  This is an alert, oriented, obese gentleman with thick  neck. There are no pressure marks on his face from the mask.  HEENT:  Nasal mucosa does not appear remarkable at this time.  CHEST:  Lung fields are quiet and clear. Heart sounds regular. There is no  peripheral edema.   IMPRESSION:  1.  Obstructive sleep apnea.  2.  Vasomotor rhinitis increased by CPAP use.   PLAN:  1.  Try stopping Rhinocort and loratadine.  2.  Use ipratropium 0.06% nasal spray to each nostril b.i.d. p.r.n.  3.  Sample prescription Astelin one spray to each nostril daily.  4.  GRS is instructed to reduce CPAP from 15 to 14 CWP.  5.  Scheduled to return in 1 month, earlier p.r.n.              Clinton D. Maple Hudson, MD, FCCP, FACP   CDY/MedQ  DD:  11/05/2005  DT:  11/05/2005  Job #:  [id number]

## 2010-08-21 NOTE — Procedures (Signed)
Ralph Garrett, Ralph Garrett               ACCOUNT NO.:  [id number]   MEDICAL RECORD NO.:  [id number]          PATIENT TYPE:  OUT   LOCATION:  SLEEP CENTER                 [facility]ACILITY[facility]  [facility]   PHYSICIAN:  Clinton D. Maple Hudson, M.D. DATE OF BIRTH:  October 29, 1945   DATE OF STUDY:                              NOCTURNAL POLYSOMNOGRAM   REFERRING PHYSICIAN:  Clinton Young.   INDICATIONS FOR STUDY:  Hypersomnia with sleep apnea.   EPWORTH SLEEPINESS SCORE:  05/24.   BMI:  36.4, weight 275 pounds.   HOME MEDICATION:  Nexium, Lisinopril.   SLEEP ARCHITECTURE:  Total sleep time 349 minutes with sleep efficiency 85%.  Stage I was 13%, stage II 59%, stages III and IV 13%, REM 15% of total sleep  time.  Sleep latency 1 minute, REM latency 246 minutes, awake after sleep  onset 58 minutes, arousal index 26.5.  No bedtime medication reported.   RESPIRATORY DATA:  Split study protocol.  Apnea/hypopnea index (HPI, RDI)  90.6 obstructive events per hour indicating severe obstructive sleep  apnea/hypopnea syndrome before CPAP.  This included 153 obstructive apneas  and 53 hypopneas before CPAP.  Events were more frequent while supine but  also noted significantly in other sleep positions.  REM AHI 2.3 per hour.  CPAP was titrated to 15 CWP, AHI 3.1 per hour.  A small ComfortGel mask with  the deluxe chin strap and heated humidifier were used.   OXYGEN DATA:  Loud snoring with oxygen desaturation to a nadir of 81% before  CPAP.  After CPAP control saturation held 96-98% on room air.   CARDIAC DATA:  Normal sinus rhythm.   MOVEMENT/PARASOMNIAS:  A total of 246 limb jerks were reported with  insignificant impact on sleep.   IMPRESSION/RECOMMENDATIONS:  1.  Severe obstructive sleep apnea/hypopnea syndrome, AHI 90.6 per hour with      the events somewhat more common while supine.  Loud snoring with oxygen      desaturation to a nadir of 81%.  2.  Successful CPAP titration to 15 mL of the feet, AHI 3.1 per hour.   A      small ComfortGel mask was used with heated humidifier.  The technician      also added chin strap.      Clinton D. Maple Hudson, M.D.  Diplomate, Biomedical engineer of Sleep Medicine  Electronically Signed     CDY/MEDQ  D:  08/08/2005 11:30:29  T:  08/09/2005 12:54:25  Job:  [id number]

## 2010-11-30 ENCOUNTER — Other Ambulatory Visit: Payer: Self-pay | Admitting: Pulmonary Disease

## 2010-12-28 LAB — BASIC METABOLIC PANEL
BUN: 10
CO2: 29
Calcium: 9.6
Chloride: 101
Creatinine, Ser: 0.9
GFR calc Af Amer: >60
GFR calc non Af Amer: >60
Glucose, Bld: 192 — ABNORMAL HIGH
Potassium: 4.6
Sodium: 138

## 2010-12-28 LAB — POCT HEMOGLOBIN-HEMACUE: Hemoglobin: 14.8

## 2011-03-08 ENCOUNTER — Encounter: Payer: Self-pay | Admitting: Gastroenterology

## 2011-03-25 ENCOUNTER — Encounter: Payer: Self-pay | Admitting: Gastroenterology

## 2011-03-25 ENCOUNTER — Other Ambulatory Visit: Payer: Self-pay | Admitting: Pulmonary Disease

## 2011-03-25 ENCOUNTER — Ambulatory Visit (AMBULATORY_SURGERY_CENTER): Payer: Medicare Other | Admitting: *Deleted

## 2011-03-25 DIAGNOSIS — Z86010 Personal history of colonic polyps: Secondary | ICD-10-CM

## 2011-03-25 DIAGNOSIS — Z1211 Encounter for screening for malignant neoplasm of colon: Secondary | ICD-10-CM

## 2011-03-25 MED ORDER — PEG-KCL-NACL-NASULF-NA ASC-C 100 G PO SOLR: 1.0000 | Freq: Once | ORAL | Status: DC

## 2011-04-12 ENCOUNTER — Encounter: Payer: Self-pay | Admitting: Gastroenterology

## 2011-04-12 ENCOUNTER — Ambulatory Visit (AMBULATORY_SURGERY_CENTER): Payer: Medicare Other | Admitting: Gastroenterology

## 2011-04-12 DIAGNOSIS — Z1211 Encounter for screening for malignant neoplasm of colon: Secondary | ICD-10-CM | POA: Diagnosis not present

## 2011-04-12 DIAGNOSIS — K5730 Diverticulosis of large intestine without perforation or abscess without bleeding: Secondary | ICD-10-CM

## 2011-04-12 DIAGNOSIS — Z860100 Personal history of colon polyps, unspecified: Secondary | ICD-10-CM | POA: Insufficient documentation

## 2011-04-12 DIAGNOSIS — D126 Benign neoplasm of colon, unspecified: Secondary | ICD-10-CM | POA: Diagnosis not present

## 2011-04-12 DIAGNOSIS — G4733 Obstructive sleep apnea (adult) (pediatric): Secondary | ICD-10-CM | POA: Diagnosis not present

## 2011-04-12 DIAGNOSIS — I2510 Atherosclerotic heart disease of native coronary artery without angina pectoris: Secondary | ICD-10-CM | POA: Diagnosis not present

## 2011-04-12 DIAGNOSIS — I10 Essential (primary) hypertension: Secondary | ICD-10-CM | POA: Diagnosis not present

## 2011-04-12 MED ORDER — SODIUM CHLORIDE 0.9 % IV SOLN: 500.0000 mL | INTRAVENOUS | Status: DC

## 2011-04-12 NOTE — Progress Notes (Signed)
Patient did not experience any of the following events: a burn prior to discharge; a fall within the facility; wrong site/side/patient/procedure/implant event; or a hospital transfer or hospital admission upon discharge from the facility. (G8907) Patient did not have preoperative order for IV antibiotic SSI prophylaxis. (G8918)  

## 2011-04-12 NOTE — Patient Instructions (Signed)
Please follow discharge instructions given today. Colon polyp removed, you will receive result letter in your mail in 1-2 weeks. Diverticulosis seen, try to follow a high fiber diet with liberal fluid intake. Repeat colonoscopy in 5 years. Resume current medications today. Call us with any questions or concerns. Thank you!!

## 2011-04-12 NOTE — Op Note (Signed)
[facility] [address] [place], [place]  [address]  COLONOSCOPY PROCEDURE REPORT  PATIENT:  Ralph Garrett, Ralph Garrett  MR#:  [id number] BIRTHDATE:  1945-11-17, 65 yrs. old  GENDER:  male ENDOSCOPIST:  Vania Rea. Jarold Motto, MD, [facility] REF. BY: PROCEDURE DATE:  04/12/2011 PROCEDURE:  Colonoscopy with snare polypectomy ASA CLASS:  Class III INDICATIONS:  history of pre-cancerous (adenomatous) colon polyps  MEDICATIONS:   propofol (Diprivan) 200 mg IV  DESCRIPTION OF PROCEDURE:   After the risks and benefits and of the procedure were explained, informed consent was obtained. Digital rectal exam was performed and revealed no abnormalities. The [id number] endoscope was introduced through the anus and advanced to the cecum, which was identified by both the appendix and ileocecal valve.  The quality of the prep was excellent, using MoviPrep.  The instrument was then slowly withdrawn as the colon was fully examined. <<PROCEDUREIMAGES>>  FINDINGS:  Moderate diverticulosis was found in the sigmoid to descending colon segments.  A sessile polyp was found. 5 MM CECAL POLYP COLD SNARE EXCISED.  This was otherwise a normal examination of the colon.   Retroflexed views in the rectum revealed no abnormalities.    The scope was then withdrawn from the patient and the procedure completed.  COMPLICATIONS:  None ENDOSCOPIC IMPRESSION: 1) Moderate diverticulosis in the sigmoid to descending colon segments 2) Sessile polyp 3) Otherwise normal examination RECOMMENDATIONS: 1) Repeat Colonoscopy in 5 years. 2) High fiber diet  REPEAT EXAM:  No  ______________________________ Vania Rea. Jarold Motto, MD, Clement[facility] Graham  CC:  n. eSIGNED:   Vania Rea. Patterson at 04/12/2011 10:18 AM  Dennison Nancy, [phone]

## 2011-04-13 ENCOUNTER — Telehealth: Payer: Self-pay

## 2011-04-13 NOTE — Telephone Encounter (Signed)

## 2011-04-15 DIAGNOSIS — N529 Male erectile dysfunction, unspecified: Secondary | ICD-10-CM | POA: Diagnosis not present

## 2011-04-15 DIAGNOSIS — R972 Elevated prostate specific antigen [PSA]: Secondary | ICD-10-CM | POA: Diagnosis not present

## 2011-04-15 DIAGNOSIS — N401 Enlarged prostate with lower urinary tract symptoms: Secondary | ICD-10-CM | POA: Diagnosis not present

## 2011-04-15 DIAGNOSIS — N138 Other obstructive and reflux uropathy: Secondary | ICD-10-CM | POA: Diagnosis not present

## 2011-04-15 DIAGNOSIS — N400 Enlarged prostate without lower urinary tract symptoms: Secondary | ICD-10-CM | POA: Diagnosis not present

## 2011-04-16 ENCOUNTER — Encounter: Payer: Self-pay | Admitting: Gastroenterology

## 2011-05-26 DIAGNOSIS — E669 Obesity, unspecified: Secondary | ICD-10-CM

## 2011-06-01 ENCOUNTER — Telehealth: Payer: Self-pay | Admitting: Pulmonary Disease

## 2011-06-01 DIAGNOSIS — Z87898 Personal history of other specified conditions: Secondary | ICD-10-CM

## 2011-06-01 DIAGNOSIS — R972 Elevated prostate specific antigen [PSA]: Secondary | ICD-10-CM

## 2011-06-01 DIAGNOSIS — I10 Essential (primary) hypertension: Secondary | ICD-10-CM

## 2011-06-01 DIAGNOSIS — E7800 Pure hypercholesterolemia, unspecified: Secondary | ICD-10-CM

## 2011-06-01 NOTE — Telephone Encounter (Signed)
Dr. Kriste Basque, please advise what labs pt will need to have done thanks

## 2011-06-01 NOTE — Telephone Encounter (Signed)
Per SN: FLP, BMET, hepatic, CBC DIFF, TSH, PSA, UA  I have placed labs in EPIC and spouse is aware. Nothing further was needed

## 2011-06-02 ENCOUNTER — Other Ambulatory Visit (INDEPENDENT_AMBULATORY_CARE_PROVIDER_SITE_OTHER): Payer: Medicare Other

## 2011-06-02 DIAGNOSIS — R972 Elevated prostate specific antigen [PSA]: Secondary | ICD-10-CM | POA: Diagnosis not present

## 2011-06-02 DIAGNOSIS — I10 Essential (primary) hypertension: Secondary | ICD-10-CM | POA: Diagnosis not present

## 2011-06-02 DIAGNOSIS — Z0289 Encounter for other administrative examinations: Secondary | ICD-10-CM

## 2011-06-02 DIAGNOSIS — E7800 Pure hypercholesterolemia, unspecified: Secondary | ICD-10-CM | POA: Diagnosis not present

## 2011-06-02 DIAGNOSIS — Z87898 Personal history of other specified conditions: Secondary | ICD-10-CM

## 2011-06-02 LAB — URINALYSIS
Bilirubin Urine: NEGATIVE
Hgb urine dipstick: NEGATIVE
Ketones, ur: NEGATIVE
Leukocytes, UA: NEGATIVE
Nitrite: NEGATIVE
Total Protein, Urine: NEGATIVE
pH: 6 (ref 5.0–8.0)

## 2011-06-02 LAB — LIPID PANEL
HDL: 37.8 mg/dL — ABNORMAL LOW (ref >39.00)
LDL Cholesterol: 133 mg/dL — ABNORMAL HIGH (ref 0–99)
Total CHOL/HDL Ratio: 5
Triglycerides: 116 mg/dL (ref 0.0–149.0)
VLDL: 23.2 mg/dL (ref 0.0–40.0)

## 2011-06-02 LAB — CBC WITH DIFFERENTIAL/PLATELET
Basophils Absolute: 0.1 10*3/uL (ref 0.0–0.1)
Eosinophils Absolute: 0.4 10*3/uL (ref 0.0–0.7)
Lymphocytes Relative: 27.6 % (ref 12.0–46.0)
MCHC: 33.8 g/dL (ref 30.0–36.0)
MCV: 92.4 fl (ref 78.0–100.0)
Monocytes Absolute: 0.5 10*3/uL (ref 0.1–1.0)
Neutrophils Relative %: 58.9 % (ref 43.0–77.0)
RDW: 13.6 % (ref 11.5–14.6)

## 2011-06-02 LAB — BASIC METABOLIC PANEL
CO2: 29 mEq/L (ref 19–32)
Calcium: 9.6 mg/dL (ref 8.4–10.5)
Chloride: 103 mEq/L (ref 96–112)
Glucose, Bld: 101 mg/dL — ABNORMAL HIGH (ref 70–99)
Potassium: 4.5 mEq/L (ref 3.5–5.1)
Sodium: 139 mEq/L (ref 135–145)

## 2011-06-02 LAB — HEPATIC FUNCTION PANEL
ALT: 29 U/L (ref 0–53)
AST: 29 U/L (ref 0–37)
Alkaline Phosphatase: 69 U/L (ref 39–117)
Bilirubin, Direct: 0.1 mg/dL (ref 0.0–0.3)
Total Bilirubin: 0.6 mg/dL (ref 0.3–1.2)
Total Protein: 7 g/dL (ref 6.0–8.3)

## 2011-06-02 LAB — PSA: PSA: 3.39 ng/mL (ref 0.10–4.00)

## 2011-06-03 ENCOUNTER — Ambulatory Visit (INDEPENDENT_AMBULATORY_CARE_PROVIDER_SITE_OTHER): Payer: Medicare Other | Admitting: Pulmonary Disease

## 2011-06-03 ENCOUNTER — Ambulatory Visit (INDEPENDENT_AMBULATORY_CARE_PROVIDER_SITE_OTHER)
Admission: RE | Admit: 2011-06-03 | Discharge: 2011-06-03 | Disposition: A | Discharge status: Home / Self Care | Payer: Medicare Other | Source: Ambulatory Visit | Attending: Pulmonary Disease | Admitting: Pulmonary Disease

## 2011-06-03 ENCOUNTER — Encounter: Payer: Self-pay | Admitting: Pulmonary Disease

## 2011-06-03 ENCOUNTER — Other Ambulatory Visit: Payer: Self-pay | Admitting: Pulmonary Disease

## 2011-06-03 DIAGNOSIS — I10 Essential (primary) hypertension: Secondary | ICD-10-CM

## 2011-06-03 DIAGNOSIS — F17200 Nicotine dependence, unspecified, uncomplicated: Secondary | ICD-10-CM | POA: Diagnosis not present

## 2011-06-03 DIAGNOSIS — G600 Hereditary motor and sensory neuropathy: Secondary | ICD-10-CM | POA: Insufficient documentation

## 2011-06-03 DIAGNOSIS — I2510 Atherosclerotic heart disease of native coronary artery without angina pectoris: Secondary | ICD-10-CM | POA: Diagnosis not present

## 2011-06-03 DIAGNOSIS — D126 Benign neoplasm of colon, unspecified: Secondary | ICD-10-CM

## 2011-06-03 DIAGNOSIS — K5730 Diverticulosis of large intestine without perforation or abscess without bleeding: Secondary | ICD-10-CM

## 2011-06-03 DIAGNOSIS — E7800 Pure hypercholesterolemia, unspecified: Secondary | ICD-10-CM

## 2011-06-03 DIAGNOSIS — G4733 Obstructive sleep apnea (adult) (pediatric): Secondary | ICD-10-CM | POA: Diagnosis not present

## 2011-06-03 DIAGNOSIS — E669 Obesity, unspecified: Secondary | ICD-10-CM

## 2011-06-03 DIAGNOSIS — Z87898 Personal history of other specified conditions: Secondary | ICD-10-CM

## 2011-06-03 MED ORDER — ROSUVASTATIN CALCIUM 5 MG PO TABS: 5.0000 mg | ORAL_TABLET | Freq: Every day | ORAL | Status: DC

## 2011-06-03 NOTE — Patient Instructions (Signed)
Today we updated your med list in our EPIC system...    Continue your current medications the same...    We refilled your meds for 2013...  We decided to start CRESTOR 5mg  for your cholesterol...    After you have been on the med for 2-3 months, let's plan follow up labs to check your response...  Today we did your follow up CXR...    Please call the PHONE TREE in a few days for your results...    Dial [id number] & when prompted enter your patient number followed by the # symbol...    Your patient number is:  [id number]  Let's get on track w/ our diet & exercise program...  We will arrange for a Neurology consult to advise [place] on your CMT neuropathy...  Call for any questions or if we can be of service in any way.Marland KitchenMarland Kitchen

## 2011-06-04 ENCOUNTER — Other Ambulatory Visit: Payer: Self-pay | Admitting: Pulmonary Disease

## 2011-06-04 DIAGNOSIS — E7800 Pure hypercholesterolemia, unspecified: Secondary | ICD-10-CM

## 2011-06-05 ENCOUNTER — Encounter: Payer: Self-pay | Admitting: Pulmonary Disease

## 2011-06-05 NOTE — Progress Notes (Signed)
Subjective:     Patient ID: Ralph Garrett, male   DOB: [birth year], 66 y.o.   MRN: [id number]  HPI 66 y/o WM here for a follow up visit>  He was able to retire in his 43's & enjoys competitive bridge tournaments...  ~  September 22, 2009:  last seen 3/09  but he participated in the Optifast program 2010 w/ CPX DrLowne at that time> he lost 52# from 307# to 255# & has kept it off... he has since had some problems w/ his CPAP & I suspect interface issues due to wt loss (I have encouraged f/u w/ DrYoung- he may need repeat split night study)... BP controlled on meds;  Chol improved w/ weight reduction;  followed by Urology- DrPeterson Q39mo on Avodart;  finally he notes prob CMT neuropathy w/ some feet & legs symptoms that run in the family...  ~  June 03, 2011:  63mo ROV & review of medical problems> [name] is feeling well & has no new complaints or concerns...    OSA> he remains on CPAP at home, doing satis by his hx & no problems w/ apparatus...    HBP> controlled on Lisin20; BP= 122/74 & denies CP, palpit, dizzy, SOB, edema, etc...    Hx CAD> known nonobstructive dis & denies angina; he is active but hasn't lost wt & we reviewed diet & exercise program...    Chol> on diet alone but not at goal w/ FLP showing TChol 194, TG 116, HDL 38, LDL 133 and he is ready to start meds, rec CRESTOR 5mg /d...    Obesity> wt was down to 255# 6/11 on Optifast program from [facility]; now back up to 285# we reviiewed the need to count calories & eat less, exercise & burn more.    GI> he had a f/u colonoscopy 1/13 by DrPatterson w/ divertics & polyp found (tubular adenoma) f/u 58yrs...    GU> known BPH on Avodart from DrPeterson; PSA today= 3/39 & we will send copies to Urology; he has had prev Bx's etc...    CMT> he has family hx of peroneal muscular atrophy on mother's side of family; he notes hi arches, incr clumsy, falling some, weak grip & notes all seems to be getting worse; offered Neuro referral whenever he is ready-  in the meanwhile Rx w/ exercises, yoga, Tai Chi etc...  CXR 2/13 showed normal heart size, clear lungs, NAD.Marland KitchenMarland Kitchen  EKG 2/13 showed SBrady, rate56, LAD, IVCD, poor R progression V1-2 w/ late transition...  LABS 2/13 showed:  FLP not at goal w/ LDL 133 & rec to start Cres5;  CBC-wnl;  Chems-wnl;  TSH-wnl;  PSA-3.39   PROBLEM LIST:    Hx of OTITIS MEDIA (ICD-382.9) - hospitalized in 1987 w/ a URI, OM, and meningitis secondary to strep pneumoniae... left myringotomy tube placed by DrKraus...  OBSTRUCTIVE SLEEP APNEA (ICD-327.23) - sleep study 5/07 w/ RDI=90 & desat to 81%... sleep consult by DrYoung, stable on CPAP 14, nasal mask... he has not been successful in weight loss attempts.  HYPERTENSION (ICD-401.9) - controlled on LISINOPRIL 20mg /d... BP=122/74, doing well... denies HA, fatigue, visual changes, CP, palipit, dizziness, syncope, dyspnea, edema, etc...  CORONARY ARTERY DISEASE (ICD-414.00) - known non-obstructive CAD w/ cath 9/04 (after a non-dx Cardiolite) showing 20-30% lesions only & EF=55-60%.  HYPERCHOLESTEROLEMIA (ICD-272.0) - on diet Rx alone... ~  FLP 2/07 showed TChol 180, TG 104, HDL 33, LDL 126 ~  FLP 3/09 (wt=307#) showed TChol 188, TG 111, HDL 33, LDL 133 ~  FLP 6/11 (wt=255#) showed TChol 179, TG 93, HDL 41, LDL 119... continue diet efforts. ~  FLP 2/13 on diet alone showed  TChol 194, TG 116, HDL 38, LDL 133... He is ready to start CRESTOR 5mg /d.  OBESITY (ICD-278.00) - prev weight up to 307# in his 6'1" frame for a BMI=40-41 ~  weight 6/11 = 255# after Optifast program in 2010 ~  Weight 2/13 = 285# and he is reminded of the need for diet/ exercise/ etc...  GERD (ICD-530.81) - EGD 8/05 w/ gastritis... he had gallstones too at that time and improved post cholecystectomy...  DIVERTICULOSIS OF COLON (ICD-562.10) - colonoscopy was 6/05 DrPatterson showing divertics & polyps (hyperplastic)... last tubular adenoma removed 2001... f/u colonoscopy 2/11 showed divertics &  sessile polyp, poor prep, rec repeat 28yr. COLONIC POLYPS (ICD-211.3) ~  Colonoscopy 1/13 by DrPatterson showed divertics & one polyp= tubular adenoma & repeat planned 79yrs...  BENIGN PROSTATIC HYPERTROPHY, HX OF (ICD-V13.8) - Eval and Rx by DrPeterson w/ biopsiesx4 in the past were neg per pt's history and large prostate treated w/ AVODART... urology checks him Q32months... PSA, INCREASED (ICD-790.93) ~  Labs 2/13 showed PSA= 3.39  DEGENERATIVE JOINT DISEASE (ICD-715.90)  Hx of BACTERIAL MENINGITIS (ICD-320.9) - he had suppurative OM w/ meningitis in 1987 & made a full recovery...  R/O PERONEAL MUSCULAR ATROPHY (ICD-356.1) - high arches and ? FamHx for poss CMT neuropathy... denies motor problems, difficulty w/ ambulations, falling, etc... ~  2/13:  he has family hx of peroneal muscular atrophy on mother's side of family; he notes hi arches, incr clumsy, falling some, weak grip & notes all seems to be getting worse; offered Neuro referral whenever he is ready- in the meanwhile Rx w/ exercises, yoga, Tai Chi etc...   Past Surgical History  Procedure Date  . Colonoscopy 02/2004    Dr.Streck  . Cholecystectomy   . Trigger finger release 2009    right hand--Dr [facility]    Outpatient Encounter Prescriptions as of 06/03/2011  Medication Sig Dispense Refill  . aspirin 81 MG tablet Take 2 tablets by mouth daily       . AVODART 0.5 MG capsule 1 tablet Daily.      . DiphenhydrAMINE HCl (BENADRYL ALLERGY PO) Take 1 tablet by mouth daily.        Marland Kitchen DISCONTD: lisinopril (PRINIVIL,ZESTRIL) 20 MG tablet take 1 tablet by mouth once daily  90 tablet  0  . rosuvastatin (CRESTOR) 5 MG tablet Take 1 tablet (5 mg total) by mouth at bedtime.  90 tablet  3    Allergies  Allergen Reactions  . Penicillins     REACTION: rash as a child    Current Medications, Allergies, Past Medical History, Past Surgical History, Family History, and Social History were reviewed in [employer]  record.    Review of Systems        The patient complains of sleep disorder.  The patient denies fever, chills, sweats, anorexia, fatigue, weakness, malaise, weight loss, blurring, diplopia, eye irritation, eye discharge, vision loss, eye pain, photophobia, earache, ear discharge, tinnitus, decreased hearing, nasal congestion, nosebleeds, sore throat, hoarseness, chest pain, palpitations, syncope, dyspnea on exertion, orthopnea, PND, peripheral edema, cough, dyspnea at rest, excessive sputum, hemoptysis, wheezing, pleurisy, nausea, vomiting, diarrhea, constipation, change in bowel habits, abdominal pain, melena, hematochezia, jaundice, gas/bloating, indigestion/heartburn, dysphagia, odynophagia, dysuria, hematuria, urinary frequency, urinary hesitancy, nocturia, incontinence, back pain, joint pain, joint swelling, muscle cramps, muscle weakness, stiffness, arthritis, sciatica, restless legs, leg pain at  night, leg pain with exertion, rash, itching, dryness, suspicious lesions, paralysis, paresthesias, seizures, tremors, vertigo, transient blindness, frequent falls, frequent headaches, difficulty walking, depression, anxiety, memory loss, confusion, cold intolerance, heat intolerance, polydipsia, polyphagia, polyuria, unusual weight change, abnormal bruising, bleeding, enlarged lymph nodes, urticaria, allergic rash, hay fever, and recurrent infections.     Objective:   Physical Exam     WD, Obese, 66 y/o WM in NAD... GENERAL:  Alert & oriented; pleasant & cooperative... HEENT:  [place]/AT, EOM-wnl, PERRLA, Fundi-benign, EACs-clear, TMs-wnl, NOSE-clear, THROAT-clear & wnl. NECK:  Supple w/ full ROM; no JVD; normal carotid impulses w/o bruits; no thyromegaly or nodules palpated; no lymphadenopathy. CHEST:  Clear to P & A; without wheezes/ rales/ or rhonchi heard... HEART:  Regular Rhythm; without murmurs/ rubs/ or gallops detected... ABDOMEN:  Obese, soft & nontender; normal bowel sounds; no organomegaly or  masses detected. EXT: without deformities, mild arthritic changes; no varicose veins/ +venous insuffic/ no edema. NEURO:  CN's intact; motor testing normal; sensory testing normal; gait normal & balance OK. DERM:  No lesions noted; no rash etc...  RADIOLOGY DATA:  Reviewed in the EPIC EMR & discussed w/ the patient...    >>CXR 2/13: normal heart size, clear lungs, NAD...    >>EKG 2/13: SBrady, rate56, LAD, IVCD, poor R progression V1-2 w/ late transition...   LABORATORY DATA:  Reviewed in the EPIC EMR & discussed w/ the patient...    >>LABS: FLP not at goal w/ LDL 133 & rec to start Cres5;  CBC-wnl;  Chems-wnl;  TSH-wnl;  PSA-3.39   Assessment:     OSA>  Stable on CPAP w/o issues, continue Rx...  HBP>  Controlled on Lisinopril; he understands that BP would be easier to control if his weight was down!  CAD>  Stable on ASA & risk factor reduction strategy...  CHOL>  LDL is 133 today 7 he is ready to start statin rx; try CRESTOR 5mg /d...  OBESITY>  We reviewed diet & exercise needed...  GI> GERD, Divertics, Polyps>  Followed by DrPatterson & colon 1/13 reviewed...  GU> BPH, incr PSA>  Followed by DrPeterson/ Mena Goes; doing satis w/ symptoms...  CMT>  As noted above; offered Neuro consult when he wants for now try PT, Yoga, TaiChi...  Other medical problems as noted...    Plan:     Patient's Medications  New Prescriptions   ROSUVASTATIN (CRESTOR) 5 MG TABLET    Take 1 tablet (5 mg total) by mouth at bedtime.  Previous Medications   ASPIRIN 81 MG TABLET    Take 2 tablets by mouth daily    AVODART 0.5 MG CAPSULE    1 tablet Daily.   DIPHENHYDRAMINE HCL (BENADRYL ALLERGY PO)    Take 1 tablet by mouth daily.    Modified Medications   Modified Medication Previous Medication   LISINOPRIL (PRINIVIL,ZESTRIL) 20 MG TABLET lisinopril (PRINIVIL,ZESTRIL) 20 MG tablet      take 1 tablet by mouth once daily    take 1 tablet by mouth once daily  Discontinued Medications   No medications  on file

## 2011-06-08 ENCOUNTER — Other Ambulatory Visit: Payer: Self-pay | Admitting: Pulmonary Disease

## 2011-06-08 MED ORDER — LISINOPRIL 20 MG PO TABS: 20.0000 mg | ORAL_TABLET | Freq: Every day | ORAL | Status: DC

## 2011-06-16 DIAGNOSIS — E669 Obesity, unspecified: Secondary | ICD-10-CM

## 2011-06-23 DIAGNOSIS — E669 Obesity, unspecified: Secondary | ICD-10-CM

## 2011-06-30 DIAGNOSIS — E669 Obesity, unspecified: Secondary | ICD-10-CM

## 2011-07-16 ENCOUNTER — Telehealth: Payer: Self-pay | Admitting: Pulmonary Disease

## 2011-07-16 MED ORDER — AZITHROMYCIN 250 MG PO TABS: ORAL_TABLET | ORAL | Status: AC

## 2011-07-16 NOTE — Telephone Encounter (Signed)
Spoke with pt's spouse and notified of recs per TP. She verbalized understanding. Rx was sent to pharm.

## 2011-07-16 NOTE — Telephone Encounter (Signed)
zpack #1 take as directed . Fluids and rest  Tylenol As needed   mucinex DM Twice daily  As needed  Cough Please contact office for sooner follow up if symptoms do not improve or worsen or seek emergency care   

## 2011-07-16 NOTE — Telephone Encounter (Signed)
Called and spoke with pts wife and she stated that the pt has been sick x 12 days-cough with brown sputum, fever body aches, nasal congestion and sore throat.  Pt has been using robitussin dm and benadryl without relief.  Pt is requesting that rx be called in for him.  Please advise. Thanks  Allergies  Allergen Reactions  . Penicillins     REACTION: rash as a child

## 2011-07-28 ENCOUNTER — Telehealth: Payer: Self-pay | Admitting: Pulmonary Disease

## 2011-07-28 MED ORDER — AZITHROMYCIN 250 MG PO TABS: ORAL_TABLET | ORAL | Status: AC

## 2011-07-28 NOTE — Telephone Encounter (Signed)
Pt finished Zpak about a week ago and then was exposed to someone with a cold.  Now c/o head & nasal congestion, prod cough (yellow).  Denies fever.  Taking Bendadryl prn.  Pt is requesting another Zpak.  Please advise.

## 2011-07-28 NOTE — Telephone Encounter (Signed)
Lm on vm as advised in earlier message advising rx for zpak was sent to the pharmacy and tcb if anything further was needed

## 2011-07-28 NOTE — Telephone Encounter (Signed)
Per SN---ok for pt to have zpak #1  Take as directed with no refills.  Thanks

## 2011-09-16 ENCOUNTER — Other Ambulatory Visit (INDEPENDENT_AMBULATORY_CARE_PROVIDER_SITE_OTHER): Payer: Medicare Other

## 2011-09-16 DIAGNOSIS — E7800 Pure hypercholesterolemia, unspecified: Secondary | ICD-10-CM | POA: Diagnosis not present

## 2011-09-16 LAB — LIPID PANEL
Cholesterol: 126 mg/dL (ref 0–200)
HDL: 49.1 mg/dL (ref >39.00)
LDL Cholesterol: 58 mg/dL (ref 0–99)
VLDL: 19.4 mg/dL (ref 0.0–40.0)

## 2011-09-16 LAB — HEPATIC FUNCTION PANEL
ALT: 21 U/L (ref 0–53)
Albumin: 3.9 g/dL (ref 3.5–5.2)
Total Protein: 6.7 g/dL (ref 6.0–8.3)

## 2011-11-26 ENCOUNTER — Ambulatory Visit (INDEPENDENT_AMBULATORY_CARE_PROVIDER_SITE_OTHER): Payer: Medicare Other | Admitting: Adult Health

## 2011-11-26 ENCOUNTER — Encounter: Payer: Self-pay | Admitting: Adult Health

## 2011-11-26 ENCOUNTER — Other Ambulatory Visit (INDEPENDENT_AMBULATORY_CARE_PROVIDER_SITE_OTHER): Payer: Medicare Other

## 2011-11-26 VITALS — BP 124/80 | HR 83 | Temp 97.8°F | Ht 73.0 in | Wt 277.0 lb

## 2011-11-26 DIAGNOSIS — R21 Rash and other nonspecific skin eruption: Secondary | ICD-10-CM | POA: Diagnosis not present

## 2011-11-26 LAB — BASIC METABOLIC PANEL
BUN: 12 mg/dL (ref 6–23)
CO2: 28 mEq/L (ref 19–32)
Chloride: 102 mEq/L (ref 96–112)
Creatinine, Ser: 0.9 mg/dL (ref 0.4–1.5)
Glucose, Bld: 99 mg/dL (ref 70–99)
Potassium: 4.3 mEq/L (ref 3.5–5.1)

## 2011-11-26 LAB — CBC WITH DIFFERENTIAL/PLATELET
Basophils Relative: 0.4 % (ref 0.0–3.0)
Eosinophils Absolute: 0.3 10*3/uL (ref 0.0–0.7)
Eosinophils Relative: 4.7 % (ref 0.0–5.0)
Hemoglobin: 14.6 g/dL (ref 13.0–17.0)
Lymphocytes Relative: 23.6 % (ref 12.0–46.0)
Monocytes Relative: 6.7 % (ref 3.0–12.0)
Neutro Abs: 4.8 10*3/uL (ref 1.4–7.7)
Neutrophils Relative %: 64.6 % (ref 43.0–77.0)
RBC: 4.74 Mil/uL (ref 4.22–5.81)
WBC: 7.4 10*3/uL (ref 4.5–10.5)

## 2011-11-26 LAB — HEPATIC FUNCTION PANEL
Albumin: 4.3 g/dL (ref 3.5–5.2)
Alkaline Phosphatase: 56 U/L (ref 39–117)
Bilirubin, Direct: 0.1 mg/dL (ref 0.0–0.3)

## 2011-11-26 LAB — SEDIMENTATION RATE: Sed Rate: 20 mm/hr (ref 0–22)

## 2011-11-26 MED ORDER — CEPHALEXIN 500 MG PO CAPS: 500.0000 mg | ORAL_CAPSULE | Freq: Four times a day (QID) | ORAL | Status: AC

## 2011-11-26 MED ORDER — DESOXIMETASONE 0.05 % EX CREA: TOPICAL_CREAM | Freq: Two times a day (BID) | CUTANEOUS | Status: DC

## 2011-11-26 NOTE — Progress Notes (Signed)
Subjective:     Patient ID: Ralph Garrett, male   DOB: 1945/11/15, 66 y.o.   MRN: [id number]  HPI  66 y/o WM    ~  September 22, 2009:  last seen 3/09  but he participated in the Optifast program 2010 w/ CPX DrLowne at that time> he lost 52# from 307# to 255# & has kept it off... he has since had some problems w/ his CPAP & I suspect interface issues due to wt loss (I have encouraged f/u w/ DrYoung- he may need repeat split night study)... BP controlled on meds;  Chol improved w/ weight reduction;  followed by Urology- DrPeterson Q2mo on Avodart;  finally he notes prob CMT neuropathy w/ some feet & legs symptoms that run in the family...  ~  June 03, 2011:  2mo ROV & review of medical problems> Ralph Garrett is feeling well & has no new complaints or concerns...    OSA> he remains on CPAP at home, doing satis by his hx & no problems w/ apparatus...    HBP> controlled on Lisin20; BP= 122/74 & denies CP, palpit, dizzy, SOB, edema, etc...    Hx CAD> known nonobstructive dis & denies angina; he is active but hasn't lost wt & we reviewed diet & exercise program...    Chol> on diet alone but not at goal w/ FLP showing TChol 194, TG 116, HDL 38, LDL 133 and he is ready to start meds, rec CRESTOR 5mg /d...    Obesity> wt was down to 255# 6/11 on Optifast program from [facility]; now back up to 285# we reviiewed the need to count calories & eat less, exercise & burn more.    GI> he had a f/u colonoscopy 1/13 by DrPatterson w/ divertics & polyp found (tubular adenoma) f/u 81yrs...    GU> known BPH on Avodart from DrPeterson; PSA today= 3/39 & we will send copies to Urology; he has had prev Bx's etc...    CMT> he has family hx of peroneal muscular atrophy on mother's side of family; he notes hi arches, incr clumsy, falling some, weak grip & notes all seems to be getting worse; offered Neuro referral whenever he is ready- in the meanwhile Rx w/ exercises, yoga, Tai Chi etc...  CXR 2/13 showed normal heart size, clear  lungs, NAD.Marland KitchenMarland Kitchen  EKG 2/13 showed SBrady, rate56, LAD, IVCD, poor R progression V1-2 w/ late transition...  LABS 2/13 showed:  FLP not at goal w/ LDL 133 & rec to start Cres5;  CBC-wnl;  Chems-wnl;  TSH-wnl;  PSA-3.39  11/26/2011 Acute OV  Complains of rash along front and back of both legs-started with small areas on 2 days ago - doubled last night-painful and burning feelings. Pruritic at first.  Used anti-itch cream without help . No recent travel or new meds.  ?bug bite outside but did not see anything.  No fever or chest pain. No nausea /vomitting. No new skin products.  No neck stiffness. No previous episodes.  No swimming or hot tub use .  No drainage .    PROBLEM LIST:    Hx of OTITIS MEDIA (ICD-382.9) - hospitalized in 1987 w/ a URI, OM, and meningitis secondary to strep pneumoniae... left myringotomy tube placed by DrKraus...  OBSTRUCTIVE SLEEP APNEA (ICD-327.23) - sleep study 5/07 w/ RDI=90 & desat to 81%... sleep consult by DrYoung, stable on CPAP 14, nasal mask... he has not been successful in weight loss attempts.  HYPERTENSION (ICD-401.9) - controlled on LISINOPRIL 20mg /d... BP=122/74, doing well.Marland KitchenMarland Kitchen  denies HA, fatigue, visual changes, CP, palipit, dizziness, syncope, dyspnea, edema, etc...  CORONARY ARTERY DISEASE (ICD-414.00) - known non-obstructive CAD w/ cath 9/04 (after a non-dx Cardiolite) showing 20-30% lesions only & EF=55-60%.  HYPERCHOLESTEROLEMIA (ICD-272.0) - on diet Rx alone... ~  FLP 2/07 showed TChol 180, TG 104, HDL 33, LDL 126 ~  FLP 3/09 (wt=307#) showed TChol 188, TG 111, HDL 33, LDL 133 ~  FLP 6/11 (wt=255#) showed TChol 179, TG 93, HDL 41, LDL 119... continue diet efforts. ~  FLP 2/13 on diet alone showed  TChol 194, TG 116, HDL 38, LDL 133... He is ready to start CRESTOR 5mg /d.  OBESITY (ICD-278.00) - prev weight up to 307# in his 6'1" frame for a BMI=40-41 ~  weight 6/11 = 255# after Optifast program in 2010 ~  Weight 2/13 = 285# and he is reminded  of the need for diet/ exercise/ etc...  GERD (ICD-530.81) - EGD 8/05 w/ gastritis... he had gallstones too at that time and improved post cholecystectomy...  DIVERTICULOSIS OF COLON (ICD-562.10) - colonoscopy was 6/05 DrPatterson showing divertics & polyps (hyperplastic)... last tubular adenoma removed 2001... f/u colonoscopy 2/11 showed divertics & sessile polyp, poor prep, rec repeat 22yr. COLONIC POLYPS (ICD-211.3) ~  Colonoscopy 1/13 by DrPatterson showed divertics & one polyp= tubular adenoma & repeat planned 50yrs...  BENIGN PROSTATIC HYPERTROPHY, HX OF (ICD-V13.8) - Eval and Rx by DrPeterson w/ biopsiesx4 in the past were neg per pt's history and large prostate treated w/ AVODART... urology checks him Q42months... PSA, INCREASED (ICD-790.93) ~  Labs 2/13 showed PSA= 3.39  DEGENERATIVE JOINT DISEASE (ICD-715.90)  Hx of BACTERIAL MENINGITIS (ICD-320.9) - he had suppurative OM w/ meningitis in 1987 & made a full recovery...  R/O PERONEAL MUSCULAR ATROPHY (ICD-356.1) - high arches and ? FamHx for poss CMT neuropathy... denies motor problems, difficulty w/ ambulations, falling, etc... ~  2/13:  he has family hx of peroneal muscular atrophy on mother's side of family; he notes hi arches, incr clumsy, falling some, weak grip & notes all seems to be getting worse; offered Neuro referral whenever he is ready- in the meanwhile Rx w/ exercises, yoga, Tai Chi etc...   Past Surgical History  Procedure Date  . Colonoscopy 02/2004    Dr.Streck  . Cholecystectomy   . Trigger finger release 2009    right hand--Dr [facility]    Outpatient Encounter Prescriptions as of 11/26/2011  Medication Sig Dispense Refill  . aspirin 81 MG tablet Take 2 tablets by mouth daily       . AVODART 0.5 MG capsule 1 tablet Daily.      . DiphenhydrAMINE HCl (BENADRYL ALLERGY PO) Take 1 tablet by mouth daily.        Marland Kitchen lisinopril (PRINIVIL,ZESTRIL) 20 MG tablet Take 1 tablet (20 mg total) by mouth daily.  90 tablet  3  .  rosuvastatin (CRESTOR) 5 MG tablet Take 1 tablet (5 mg total) by mouth at bedtime.  90 tablet  3    Allergies  Allergen Reactions  . Penicillins     REACTION: rash as a child    Current Medications, Allergies, Past Medical History, Past Surgical History, Family History, and Social History were reviewed in [employer] record.    Review of Systems        Constitutional:   No  weight loss, night sweats,  Fevers, chills, fatigue, or  lassitude.  HEENT:   No headaches,  Difficulty swallowing,  Tooth/dental problems, or  Sore throat,  No sneezing, itching, ear ache, nasal congestion, post nasal drip,   CV:  No chest pain,  Orthopnea, PND, swelling in lower extremities, anasarca, dizziness, palpitations, syncope.   GI  No heartburn, indigestion, abdominal pain, nausea, vomiting, diarrhea, change in bowel habits, loss of appetite, bloody stools.   Resp: No shortness of breath with exertion or at rest.  No excess mucus, no productive cough,  No non-productive cough,  No coughing up of blood.  No change in color of mucus.  No wheezing.  No chest wall deformity  Skin: + rash or lesions.  GU: no dysuria, change in color of urine, no urgency or frequency.  No flank pain, no hematuria   MS:  No joint pain or swelling.  No decreased range of motion.  No back pain.  Psych:  No change in mood or affect. No depression or anxiety.  No memory loss.       Objective:   Physical Exam      WD, Obese, 66 y/o WM in NAD... GENERAL:  Alert & oriented; pleasant & cooperative... HEENT:  [place]/AT, , EACs-clear, TMs-wnl, NOSE-clear, THROAT-clear & wnl. NECK:  Supple w/ full ROM; no JVD; normal carotid impulses w/o bruits; no thyromegaly or nodules palpated; no lymphadenopathy. CHEST:  Clear to P & A; without wheezes/ rales/ or rhonchi heard... HEART:  Regular Rhythm; without murmurs/ rubs/ or gallops detected... ABDOMEN:  Obese, soft & nontender; normal bowel  sounds; no organomegaly or masses detected. EXT: without deformities, mild arthritic changes; no varicose veins/ +venous insuffic/ no edema. NEURO:  sensory testing normal; gait normal & balance OK. DERM: along bilateral  thighs and lower back scattered punctate lesion with firm center, w/ red base.  No drainage noted. Warm to touch.    Assessment:

## 2011-11-26 NOTE — Patient Instructions (Addendum)
Cool compresses , avoid hot showers Begin Zyrtec 10mg  daily for 5 days .  Begin Pepcid 20mg  daily for 5 days  Apply Topicort to area Twice daily  For 7 days  If not improving or worsens call back for sooner follow up  If develop other symptoms -fever, joint swelling , etc call back or go to ER Please contact office for sooner follow up if symptoms do not improve or worsen or seek emergency care

## 2011-11-29 NOTE — Assessment & Plan Note (Signed)
Low extremity and lower trunk rash ? Etiology  ? Contact dermatitis   Plan Cool compresses , avoid hot showers Begin Zyrtec 10mg  daily for 5 days .  Begin Pepcid 20mg  daily for 5 days  Apply Topicort to area Twice daily  For 7 days  If not improving or worsens call back for sooner follow up  If develop other symptoms -fever, joint swelling , etc call back or go to ER Please contact office for sooner follow up if symptoms do not improve or worsen or seek emergency care   Will add Keflex x 7 days to cover for any possible skin infections as pt is leaving to go out of town  Advised if not improving will need to call back for sooner follow up or go to ER.

## 2012-01-21 DIAGNOSIS — Z23 Encounter for immunization: Secondary | ICD-10-CM | POA: Diagnosis not present

## 2012-02-18 DIAGNOSIS — S0003XA Contusion of scalp, initial encounter: Secondary | ICD-10-CM | POA: Diagnosis not present

## 2012-02-18 DIAGNOSIS — G44309 Post-traumatic headache, unspecified, not intractable: Secondary | ICD-10-CM | POA: Diagnosis not present

## 2012-02-18 DIAGNOSIS — S0990XA Unspecified injury of head, initial encounter: Secondary | ICD-10-CM | POA: Diagnosis not present

## 2012-02-18 DIAGNOSIS — M542 Cervicalgia: Secondary | ICD-10-CM | POA: Diagnosis not present

## 2012-02-18 DIAGNOSIS — S0083XA Contusion of other part of head, initial encounter: Secondary | ICD-10-CM | POA: Diagnosis not present

## 2012-02-18 DIAGNOSIS — M5030 Other cervical disc degeneration, unspecified cervical region: Secondary | ICD-10-CM | POA: Diagnosis not present

## 2012-02-18 DIAGNOSIS — G8911 Acute pain due to trauma: Secondary | ICD-10-CM | POA: Diagnosis not present

## 2012-02-18 DIAGNOSIS — M129 Arthropathy, unspecified: Secondary | ICD-10-CM | POA: Diagnosis not present

## 2012-02-23 ENCOUNTER — Other Ambulatory Visit: Payer: Self-pay | Admitting: Dermatology

## 2012-02-23 DIAGNOSIS — D044 Carcinoma in situ of skin of scalp and neck: Secondary | ICD-10-CM | POA: Diagnosis not present

## 2012-02-23 DIAGNOSIS — L820 Inflamed seborrheic keratosis: Secondary | ICD-10-CM | POA: Diagnosis not present

## 2012-02-23 DIAGNOSIS — D045 Carcinoma in situ of skin of trunk: Secondary | ICD-10-CM | POA: Diagnosis not present

## 2012-02-23 DIAGNOSIS — D485 Neoplasm of uncertain behavior of skin: Secondary | ICD-10-CM | POA: Diagnosis not present

## 2012-02-23 DIAGNOSIS — D0420 Carcinoma in situ of skin of unspecified ear and external auricular canal: Secondary | ICD-10-CM | POA: Diagnosis not present

## 2012-02-23 DIAGNOSIS — L821 Other seborrheic keratosis: Secondary | ICD-10-CM | POA: Diagnosis not present

## 2012-02-23 DIAGNOSIS — L570 Actinic keratosis: Secondary | ICD-10-CM | POA: Diagnosis not present

## 2012-02-23 DIAGNOSIS — D0460 Carcinoma in situ of skin of unspecified upper limb, including shoulder: Secondary | ICD-10-CM | POA: Diagnosis not present

## 2012-04-12 ENCOUNTER — Ambulatory Visit (INDEPENDENT_AMBULATORY_CARE_PROVIDER_SITE_OTHER): Payer: Medicare Other | Admitting: Pulmonary Disease

## 2012-04-12 ENCOUNTER — Encounter: Payer: Self-pay | Admitting: Pulmonary Disease

## 2012-04-12 ENCOUNTER — Telehealth: Payer: Self-pay | Admitting: Pulmonary Disease

## 2012-04-12 DIAGNOSIS — G600 Hereditary motor and sensory neuropathy: Secondary | ICD-10-CM

## 2012-04-12 DIAGNOSIS — D126 Benign neoplasm of colon, unspecified: Secondary | ICD-10-CM

## 2012-04-12 DIAGNOSIS — K219 Gastro-esophageal reflux disease without esophagitis: Secondary | ICD-10-CM

## 2012-04-12 DIAGNOSIS — E7800 Pure hypercholesterolemia, unspecified: Secondary | ICD-10-CM

## 2012-04-12 DIAGNOSIS — I2510 Atherosclerotic heart disease of native coronary artery without angina pectoris: Secondary | ICD-10-CM

## 2012-04-12 DIAGNOSIS — G4733 Obstructive sleep apnea (adult) (pediatric): Secondary | ICD-10-CM

## 2012-04-12 DIAGNOSIS — K5730 Diverticulosis of large intestine without perforation or abscess without bleeding: Secondary | ICD-10-CM

## 2012-04-12 DIAGNOSIS — S060X9A Concussion with loss of consciousness of unspecified duration, initial encounter: Secondary | ICD-10-CM | POA: Diagnosis not present

## 2012-04-12 DIAGNOSIS — S060XAA Concussion with loss of consciousness status unknown, initial encounter: Secondary | ICD-10-CM | POA: Diagnosis not present

## 2012-04-12 DIAGNOSIS — I10 Essential (primary) hypertension: Secondary | ICD-10-CM | POA: Diagnosis not present

## 2012-04-12 DIAGNOSIS — Z87898 Personal history of other specified conditions: Secondary | ICD-10-CM

## 2012-04-12 DIAGNOSIS — M1990 Unspecified osteoarthritis, unspecified site: Secondary | ICD-10-CM

## 2012-04-12 DIAGNOSIS — E669 Obesity, unspecified: Secondary | ICD-10-CM

## 2012-04-12 MED ORDER — DUTASTERIDE 0.5 MG PO CAPS: 0.5000 mg | ORAL_CAPSULE | Freq: Every day | ORAL | Status: DC

## 2012-04-12 MED ORDER — ROSUVASTATIN CALCIUM 5 MG PO TABS: 5.0000 mg | ORAL_TABLET | Freq: Every day | ORAL | Status: DC

## 2012-04-12 MED ORDER — LISINOPRIL 20 MG PO TABS: 20.0000 mg | ORAL_TABLET | Freq: Every day | ORAL | Status: DC

## 2012-04-12 NOTE — Patient Instructions (Addendum)
Today we updated your med list in our EPIC system...    Continue your current medications the same...  We will obtain records from the [place] [facility] to have in our system in case they are needed in the furture...  Call for any problems...  Let's plan our usual physical in the next 4-6 months at your convenience.Marland KitchenMarland Kitchen
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
## 2012-04-12 NOTE — Telephone Encounter (Signed)
Forward  16 pages from [facility] to Dr. Alroy Dust for review on 04-12-12 m

## 2012-06-05 ENCOUNTER — Telehealth: Payer: Self-pay | Admitting: Pulmonary Disease

## 2012-06-05 DIAGNOSIS — H2510 Age-related nuclear cataract, unspecified eye: Secondary | ICD-10-CM | POA: Diagnosis not present

## 2012-06-07 NOTE — Telephone Encounter (Signed)
Placed on Sn cart to have the card signed by SN.

## 2012-06-08 NOTE — Telephone Encounter (Signed)
Forms completed.  Pt aware outfront to p/u. IPt verbalized understanding & states nothing further needed at this time. Antionette Fairy

## 2012-08-07 DIAGNOSIS — M25562 Pain in left knee: Secondary | ICD-10-CM | POA: Insufficient documentation

## 2012-08-07 DIAGNOSIS — S63509A Unspecified sprain of unspecified wrist, initial encounter: Secondary | ICD-10-CM | POA: Diagnosis not present

## 2012-08-07 DIAGNOSIS — M25532 Pain in left wrist: Secondary | ICD-10-CM | POA: Insufficient documentation

## 2012-08-07 DIAGNOSIS — M25569 Pain in unspecified knee: Secondary | ICD-10-CM | POA: Diagnosis not present

## 2012-08-08 ENCOUNTER — Other Ambulatory Visit: Payer: Self-pay | Admitting: Orthopedic Surgery

## 2012-08-08 ENCOUNTER — Ambulatory Visit
Admission: RE | Admit: 2012-08-08 | Discharge: 2012-08-08 | Disposition: A | Discharge status: Home / Self Care | Payer: Medicare Other | Source: Ambulatory Visit | Attending: Orthopedic Surgery | Admitting: Orthopedic Surgery

## 2012-08-08 DIAGNOSIS — M25562 Pain in left knee: Secondary | ICD-10-CM

## 2012-08-08 DIAGNOSIS — M1710 Unilateral primary osteoarthritis, unspecified knee: Secondary | ICD-10-CM | POA: Diagnosis not present

## 2012-08-08 DIAGNOSIS — IMO0002 Reserved for concepts with insufficient information to code with codable children: Secondary | ICD-10-CM | POA: Diagnosis not present

## 2012-08-15 DIAGNOSIS — M23305 Other meniscus derangements, unspecified medial meniscus, unspecified knee: Secondary | ICD-10-CM | POA: Diagnosis not present

## 2012-10-09 DIAGNOSIS — M25569 Pain in unspecified knee: Secondary | ICD-10-CM | POA: Diagnosis not present

## 2012-10-11 DIAGNOSIS — R972 Elevated prostate specific antigen [PSA]: Secondary | ICD-10-CM | POA: Diagnosis not present

## 2012-10-11 DIAGNOSIS — N401 Enlarged prostate with lower urinary tract symptoms: Secondary | ICD-10-CM | POA: Diagnosis not present

## 2012-10-11 DIAGNOSIS — N138 Other obstructive and reflux uropathy: Secondary | ICD-10-CM | POA: Diagnosis not present

## 2012-10-11 DIAGNOSIS — R3129 Other microscopic hematuria: Secondary | ICD-10-CM | POA: Diagnosis not present

## 2012-11-06 DIAGNOSIS — R972 Elevated prostate specific antigen [PSA]: Secondary | ICD-10-CM | POA: Diagnosis not present

## 2012-11-06 DIAGNOSIS — K5730 Diverticulosis of large intestine without perforation or abscess without bleeding: Secondary | ICD-10-CM | POA: Diagnosis not present

## 2012-11-06 DIAGNOSIS — N401 Enlarged prostate with lower urinary tract symptoms: Secondary | ICD-10-CM | POA: Diagnosis not present

## 2012-11-06 DIAGNOSIS — N138 Other obstructive and reflux uropathy: Secondary | ICD-10-CM | POA: Diagnosis not present

## 2012-11-06 DIAGNOSIS — R3129 Other microscopic hematuria: Secondary | ICD-10-CM | POA: Diagnosis not present

## 2012-11-16 DIAGNOSIS — M25559 Pain in unspecified hip: Secondary | ICD-10-CM | POA: Diagnosis not present

## 2012-11-16 DIAGNOSIS — M25552 Pain in left hip: Secondary | ICD-10-CM | POA: Insufficient documentation

## 2012-11-20 DIAGNOSIS — M25559 Pain in unspecified hip: Secondary | ICD-10-CM | POA: Diagnosis not present

## 2012-11-20 DIAGNOSIS — R439 Unspecified disturbances of smell and taste: Secondary | ICD-10-CM | POA: Diagnosis not present

## 2012-11-23 DIAGNOSIS — R269 Unspecified abnormalities of gait and mobility: Secondary | ICD-10-CM | POA: Diagnosis not present

## 2012-11-23 DIAGNOSIS — M25559 Pain in unspecified hip: Secondary | ICD-10-CM | POA: Diagnosis not present

## 2012-12-11 DIAGNOSIS — R269 Unspecified abnormalities of gait and mobility: Secondary | ICD-10-CM | POA: Diagnosis not present

## 2012-12-11 DIAGNOSIS — M25559 Pain in unspecified hip: Secondary | ICD-10-CM | POA: Diagnosis not present

## 2012-12-11 DIAGNOSIS — S60229A Contusion of unspecified hand, initial encounter: Secondary | ICD-10-CM | POA: Diagnosis not present

## 2012-12-13 DIAGNOSIS — N400 Enlarged prostate without lower urinary tract symptoms: Secondary | ICD-10-CM | POA: Diagnosis not present

## 2012-12-13 DIAGNOSIS — R350 Frequency of micturition: Secondary | ICD-10-CM | POA: Diagnosis not present

## 2012-12-13 DIAGNOSIS — R300 Dysuria: Secondary | ICD-10-CM | POA: Diagnosis not present

## 2012-12-19 ENCOUNTER — Other Ambulatory Visit: Payer: Self-pay | Admitting: Pulmonary Disease

## 2012-12-19 ENCOUNTER — Other Ambulatory Visit (INDEPENDENT_AMBULATORY_CARE_PROVIDER_SITE_OTHER): Payer: Medicare Other

## 2012-12-19 DIAGNOSIS — I10 Essential (primary) hypertension: Secondary | ICD-10-CM

## 2012-12-19 DIAGNOSIS — D126 Benign neoplasm of colon, unspecified: Secondary | ICD-10-CM

## 2012-12-19 DIAGNOSIS — E7800 Pure hypercholesterolemia, unspecified: Secondary | ICD-10-CM

## 2012-12-19 DIAGNOSIS — F419 Anxiety disorder, unspecified: Secondary | ICD-10-CM

## 2012-12-19 DIAGNOSIS — K5730 Diverticulosis of large intestine without perforation or abscess without bleeding: Secondary | ICD-10-CM

## 2012-12-19 DIAGNOSIS — R972 Elevated prostate specific antigen [PSA]: Secondary | ICD-10-CM

## 2012-12-19 DIAGNOSIS — F411 Generalized anxiety disorder: Secondary | ICD-10-CM

## 2012-12-19 LAB — URINALYSIS
Hgb urine dipstick: NEGATIVE
Nitrite: NEGATIVE
Total Protein, Urine: NEGATIVE

## 2012-12-19 LAB — HEPATIC FUNCTION PANEL
ALT: 37 U/L (ref 0–53)
Albumin: 3.8 g/dL (ref 3.5–5.2)
Alkaline Phosphatase: 56 U/L (ref 39–117)
Total Protein: 6.1 g/dL (ref 6.0–8.3)

## 2012-12-19 LAB — LIPID PANEL
HDL: 33.5 mg/dL — ABNORMAL LOW (ref >39.00)
Triglycerides: 186 mg/dL — ABNORMAL HIGH (ref 0.0–149.0)

## 2012-12-19 LAB — BASIC METABOLIC PANEL
CO2: 29 mEq/L (ref 19–32)
Calcium: 9.1 mg/dL (ref 8.4–10.5)
Glucose, Bld: 99 mg/dL (ref 70–99)
Sodium: 140 mEq/L (ref 135–145)

## 2012-12-19 LAB — CBC WITH DIFFERENTIAL/PLATELET
Basophils Absolute: 0 10*3/uL (ref 0.0–0.1)
Hemoglobin: 13.3 g/dL (ref 13.0–17.0)
Lymphocytes Relative: 29 % (ref 12.0–46.0)
Monocytes Relative: 6.9 % (ref 3.0–12.0)
Neutro Abs: 3.8 10*3/uL (ref 1.4–7.7)
Neutrophils Relative %: 58.6 % (ref 43.0–77.0)
Platelets: 223 10*3/uL (ref 150.0–400.0)
RDW: 13.7 % (ref 11.5–14.6)

## 2012-12-25 ENCOUNTER — Ambulatory Visit (INDEPENDENT_AMBULATORY_CARE_PROVIDER_SITE_OTHER): Payer: Medicare Other | Admitting: Pulmonary Disease

## 2012-12-25 ENCOUNTER — Encounter: Payer: Self-pay | Admitting: Pulmonary Disease

## 2012-12-25 VITALS — BP 116/76 | HR 53 | Temp 98.4°F | Ht 73.0 in | Wt 275.4 lb

## 2012-12-25 DIAGNOSIS — Z87898 Personal history of other specified conditions: Secondary | ICD-10-CM

## 2012-12-25 DIAGNOSIS — I2510 Atherosclerotic heart disease of native coronary artery without angina pectoris: Secondary | ICD-10-CM | POA: Diagnosis not present

## 2012-12-25 DIAGNOSIS — G4733 Obstructive sleep apnea (adult) (pediatric): Secondary | ICD-10-CM

## 2012-12-25 DIAGNOSIS — E785 Hyperlipidemia, unspecified: Secondary | ICD-10-CM | POA: Diagnosis not present

## 2012-12-25 DIAGNOSIS — K5730 Diverticulosis of large intestine without perforation or abscess without bleeding: Secondary | ICD-10-CM

## 2012-12-25 DIAGNOSIS — D126 Benign neoplasm of colon, unspecified: Secondary | ICD-10-CM

## 2012-12-25 DIAGNOSIS — I10 Essential (primary) hypertension: Secondary | ICD-10-CM

## 2012-12-25 DIAGNOSIS — K219 Gastro-esophageal reflux disease without esophagitis: Secondary | ICD-10-CM

## 2012-12-25 DIAGNOSIS — E663 Overweight: Secondary | ICD-10-CM

## 2012-12-25 DIAGNOSIS — G600 Hereditary motor and sensory neuropathy: Secondary | ICD-10-CM

## 2012-12-25 DIAGNOSIS — M1990 Unspecified osteoarthritis, unspecified site: Secondary | ICD-10-CM

## 2012-12-25 MED ORDER — LISINOPRIL 20 MG PO TABS: 20.0000 mg | ORAL_TABLET | Freq: Every day | ORAL | Status: DC

## 2012-12-25 NOTE — Patient Instructions (Addendum)
Today we updated your med list in our EPIC system...    Continue your current medications the same...  We reviewed your recent fasting labs & gave you a copy for your records...    We will send a copy to the attention of DrEskridge...  Keep up the great job w/ diet & weight reduction...  Call for any questions...  Let's plan a similar follow up visit in 1 yr, sooner if needed for problems.Marland KitchenMarland Kitchen
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
## 2013-01-03 DIAGNOSIS — Z23 Encounter for immunization: Secondary | ICD-10-CM | POA: Diagnosis not present

## 2013-02-05 DIAGNOSIS — IMO0002 Reserved for concepts with insufficient information to code with codable children: Secondary | ICD-10-CM | POA: Diagnosis not present

## 2013-02-05 DIAGNOSIS — M1710 Unilateral primary osteoarthritis, unspecified knee: Secondary | ICD-10-CM | POA: Diagnosis not present

## 2013-04-16 ENCOUNTER — Other Ambulatory Visit: Payer: Self-pay | Admitting: Dermatology

## 2013-04-16 DIAGNOSIS — L821 Other seborrheic keratosis: Secondary | ICD-10-CM | POA: Diagnosis not present

## 2013-04-16 DIAGNOSIS — L820 Inflamed seborrheic keratosis: Secondary | ICD-10-CM | POA: Diagnosis not present

## 2013-04-16 DIAGNOSIS — Z85828 Personal history of other malignant neoplasm of skin: Secondary | ICD-10-CM | POA: Diagnosis not present

## 2013-04-16 DIAGNOSIS — D485 Neoplasm of uncertain behavior of skin: Secondary | ICD-10-CM | POA: Diagnosis not present

## 2013-04-16 DIAGNOSIS — L570 Actinic keratosis: Secondary | ICD-10-CM | POA: Diagnosis not present

## 2013-04-16 DIAGNOSIS — D0470 Carcinoma in situ of skin of unspecified lower limb, including hip: Secondary | ICD-10-CM | POA: Diagnosis not present

## 2013-11-19 ENCOUNTER — Telehealth: Payer: Self-pay | Admitting: Pulmonary Disease

## 2013-11-19 NOTE — Telephone Encounter (Signed)
Please advise SN thanks Last OV 12/25/12

## 2013-11-19 NOTE — Telephone Encounter (Signed)
Per SN---  Ok to schedule appt with SN in an open spot.  thanks

## 2013-11-19 NOTE — Telephone Encounter (Signed)
Pt is scheduled to see SN. Nothing further needed

## 2013-11-27 ENCOUNTER — Other Ambulatory Visit (INDEPENDENT_AMBULATORY_CARE_PROVIDER_SITE_OTHER): Payer: Medicare Other

## 2013-11-27 ENCOUNTER — Ambulatory Visit (INDEPENDENT_AMBULATORY_CARE_PROVIDER_SITE_OTHER): Payer: Medicare Other | Admitting: Pulmonary Disease

## 2013-11-27 ENCOUNTER — Ambulatory Visit (INDEPENDENT_AMBULATORY_CARE_PROVIDER_SITE_OTHER)
Admission: RE | Admit: 2013-11-27 | Discharge: 2013-11-27 | Disposition: A | Discharge status: Home / Self Care | Payer: Medicare Other | Source: Ambulatory Visit | Attending: Pulmonary Disease | Admitting: Pulmonary Disease

## 2013-11-27 ENCOUNTER — Encounter: Payer: Self-pay | Admitting: Pulmonary Disease

## 2013-11-27 ENCOUNTER — Encounter (INDEPENDENT_AMBULATORY_CARE_PROVIDER_SITE_OTHER): Payer: Self-pay

## 2013-11-27 VITALS — BP 142/72 | HR 56 | Temp 97.6°F | Ht 73.0 in | Wt 283.8 lb

## 2013-11-27 DIAGNOSIS — F411 Generalized anxiety disorder: Secondary | ICD-10-CM

## 2013-11-27 DIAGNOSIS — F419 Anxiety disorder, unspecified: Secondary | ICD-10-CM

## 2013-11-27 DIAGNOSIS — D126 Benign neoplasm of colon, unspecified: Secondary | ICD-10-CM

## 2013-11-27 DIAGNOSIS — R972 Elevated prostate specific antigen [PSA]: Secondary | ICD-10-CM

## 2013-11-27 DIAGNOSIS — G600 Hereditary motor and sensory neuropathy: Secondary | ICD-10-CM

## 2013-11-27 DIAGNOSIS — I10 Essential (primary) hypertension: Secondary | ICD-10-CM

## 2013-11-27 DIAGNOSIS — K219 Gastro-esophageal reflux disease without esophagitis: Secondary | ICD-10-CM

## 2013-11-27 DIAGNOSIS — I2510 Atherosclerotic heart disease of native coronary artery without angina pectoris: Secondary | ICD-10-CM

## 2013-11-27 DIAGNOSIS — E785 Hyperlipidemia, unspecified: Secondary | ICD-10-CM

## 2013-11-27 DIAGNOSIS — K5730 Diverticulosis of large intestine without perforation or abscess without bleeding: Secondary | ICD-10-CM

## 2013-11-27 DIAGNOSIS — Z87891 Personal history of nicotine dependence: Secondary | ICD-10-CM | POA: Diagnosis not present

## 2013-11-27 DIAGNOSIS — E663 Overweight: Secondary | ICD-10-CM

## 2013-11-27 DIAGNOSIS — G4733 Obstructive sleep apnea (adult) (pediatric): Secondary | ICD-10-CM | POA: Diagnosis not present

## 2013-11-27 DIAGNOSIS — M1990 Unspecified osteoarthritis, unspecified site: Secondary | ICD-10-CM

## 2013-11-27 DIAGNOSIS — Z87898 Personal history of other specified conditions: Secondary | ICD-10-CM

## 2013-11-27 LAB — BASIC METABOLIC PANEL
BUN: 14 mg/dL (ref 6–23)
CO2: 28 mEq/L (ref 19–32)
Calcium: 9.4 mg/dL (ref 8.4–10.5)
Chloride: 104 mEq/L (ref 96–112)
Creatinine, Ser: 0.9 mg/dL (ref 0.4–1.5)
GFR: 88.98 mL/min (ref >60.00)
GLUCOSE: 101 mg/dL — AB (ref 70–99)
POTASSIUM: 4.4 meq/L (ref 3.5–5.1)
Sodium: 138 mEq/L (ref 135–145)

## 2013-11-27 LAB — CBC WITH DIFFERENTIAL/PLATELET
Basophils Absolute: 0 10*3/uL (ref 0.0–0.1)
Basophils Relative: 0.6 % (ref 0.0–3.0)
EOS PCT: 5.3 % — AB (ref 0.0–5.0)
Eosinophils Absolute: 0.3 10*3/uL (ref 0.0–0.7)
HEMATOCRIT: 42.4 % (ref 39.0–52.0)
Hemoglobin: 14.3 g/dL (ref 13.0–17.0)
LYMPHS ABS: 1.7 10*3/uL (ref 0.7–4.0)
Lymphocytes Relative: 28.2 % (ref 12.0–46.0)
MCHC: 33.7 g/dL (ref 30.0–36.0)
MCV: 92.4 fl (ref 78.0–100.0)
MONO ABS: 0.5 10*3/uL (ref 0.1–1.0)
Monocytes Relative: 8 % (ref 3.0–12.0)
Neutro Abs: 3.4 10*3/uL (ref 1.4–7.7)
Neutrophils Relative %: 57.9 % (ref 43.0–77.0)
PLATELETS: 225 10*3/uL (ref 150.0–400.0)
RBC: 4.59 Mil/uL (ref 4.22–5.81)
RDW: 13.3 % (ref 11.5–15.5)
WBC: 5.9 10*3/uL (ref 4.0–10.5)

## 2013-11-27 LAB — TSH: TSH: 0.62 u[IU]/mL (ref 0.35–4.50)

## 2013-11-27 LAB — HEPATIC FUNCTION PANEL
ALBUMIN: 4 g/dL (ref 3.5–5.2)
ALT: 24 U/L (ref 0–53)
AST: 29 U/L (ref 0–37)
Alkaline Phosphatase: 53 U/L (ref 39–117)
BILIRUBIN TOTAL: 0.5 mg/dL (ref 0.2–1.2)
Bilirubin, Direct: 0.1 mg/dL (ref 0.0–0.3)
Total Protein: 6.9 g/dL (ref 6.0–8.3)

## 2013-11-27 LAB — LIPID PANEL
Cholesterol: 180 mg/dL (ref 0–200)
HDL: 40.3 mg/dL (ref >39.00)
LDL Cholesterol: 121 mg/dL — ABNORMAL HIGH (ref 0–99)
NONHDL: 139.7
Total CHOL/HDL Ratio: 4
Triglycerides: 94 mg/dL (ref 0.0–149.0)
VLDL: 18.8 mg/dL (ref 0.0–40.0)

## 2013-11-27 LAB — PSA: PSA: 2.25 ng/mL (ref 0.10–4.00)

## 2013-11-27 MED ORDER — LISINOPRIL 20 MG PO TABS: 20.0000 mg | ORAL_TABLET | Freq: Every day | ORAL | Status: DC

## 2013-11-27 MED ORDER — FINASTERIDE 5 MG PO TABS: 5.0000 mg | ORAL_TABLET | Freq: Every day | ORAL | Status: DC
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
## 2013-11-27 NOTE — Patient Instructions (Signed)
Today we updated your med list in our EPIC system...    Continue your current medications the same...  Today we did your follow up CXR, EKG, and FASTING blood work...    We will contact you w/ the results when available...   Let's get on track w/ our diet & exercise program...    The goal is to lose 15-20 lbs...  Call for any questions...  Let's plan a follow up visit in 84yr, sooner if needed for problems.Marland KitchenMarland Kitchen

## 2013-12-03 ENCOUNTER — Encounter: Payer: Self-pay | Admitting: Pulmonary Disease

## 2013-12-06 DIAGNOSIS — M25569 Pain in unspecified knee: Secondary | ICD-10-CM | POA: Diagnosis not present

## 2014-03-05 DIAGNOSIS — M25462 Effusion, left knee: Secondary | ICD-10-CM | POA: Diagnosis not present

## 2014-03-05 DIAGNOSIS — M1712 Unilateral primary osteoarthritis, left knee: Secondary | ICD-10-CM | POA: Diagnosis not present

## 2014-03-05 DIAGNOSIS — M25562 Pain in left knee: Secondary | ICD-10-CM | POA: Diagnosis not present

## 2014-03-27 DIAGNOSIS — M25562 Pain in left knee: Secondary | ICD-10-CM | POA: Diagnosis not present

## 2014-04-04 DIAGNOSIS — M25562 Pain in left knee: Secondary | ICD-10-CM | POA: Diagnosis not present

## 2014-04-07 DIAGNOSIS — Z23 Encounter for immunization: Secondary | ICD-10-CM | POA: Diagnosis not present

## 2014-04-08 DIAGNOSIS — M25562 Pain in left knee: Secondary | ICD-10-CM | POA: Diagnosis not present

## 2014-04-08 DIAGNOSIS — S83252D Bucket-handle tear of lateral meniscus, current injury, left knee, subsequent encounter: Secondary | ICD-10-CM | POA: Diagnosis not present

## 2014-04-09 NOTE — Progress Notes (Signed)
Please put orders in Epic surgery 04-17-14 pre op 04-15-14 Thanks

## 2014-04-10 ENCOUNTER — Ambulatory Visit: Payer: Self-pay | Admitting: Orthopedic Surgery

## 2014-04-12 NOTE — Patient Instructions (Addendum)
Mann R Hohn  04/12/2014   Your procedure is scheduled on: 04/17/14   Report to [facility] Main  Entrance and follow signs to               [place] at 2:45 PM   Call this number if you have problems the morning of surgery [phone]   Remember:  Do not eat food  :After Midnight.              MAY HAVE CLEAR LIQUIDS UNTIL 10:45 A  CLEAR LIQUID DIET   Foods Allowed                                                                     Foods Excluded  Coffee and tea, regular and decaf                             liquids that you cannot  Plain Jell-O in any flavor                                             see through such as: Fruit ices (not with fruit pulp)                                     milk, soups, orange juice  Iced Popsicles                                    All solid food Carbonated beverages, regular and diet                                    Cranberry, grape and apple juices Sports drinks like Gatorade Lightly seasoned clear broth or consume(fat free) Sugar, honey syrup  _____________________________________________________________________       Take these medicines the morning of surgery with A SIP OF WATER: FENESTERIDE            BRING C PAP MASK AND TUBING TO HOSPITAL                                You may not have any metal on your body including hair pins and              piercings  Do not wear jewelry, make-up, lotions, powders or perfumes.             Do not wear nail polish.  Do not shave  48 hours prior to surgery.              Men may shave face and neck.   Do not bring valuables to the hospital. [facility] IS NOT  RESPONSIBLE   FOR VALUABLES.  Contacts, dentures or bridgework may not be worn into surgery.  Leave suitcase in the car. After surgery it may be brought to your room.     Patients discharged the day of surgery will not be allowed to drive home.  Name and phone number of your  driver:  Special Instructions: N/A              Please read over the following fact sheets you were given: _____________________________________________________________________                                                     [place]  Before surgery, you can play an important role.  Because skin is not sterile, your skin needs to be as free of germs as possible.  You can reduce the number of germs on your skin by washing with CHG (chlorahexidine gluconate) soap before surgery.  CHG is an antiseptic cleaner which kills germs and bonds with the skin to continue killing germs even after washing. Please DO NOT use if you have an allergy to CHG or antibacterial soaps.  If your skin becomes reddened/irritated stop using the CHG and inform your nurse when you arrive at Short Stay. Do not shave (including legs and underarms) for at least 48 hours prior to the first CHG shower.  You may shave your face. Please follow these instructions carefully:   1.  Shower with CHG Soap the night before surgery and the  morning of Surgery.   2.  If you choose to wash your hair, wash your hair first as usual with your  normal  Shampoo.   3.  After you shampoo, rinse your hair and body thoroughly to remove the  shampoo.                                         4.  Use CHG as you would any other liquid soap.  You can apply chg directly  to the skin and wash . Gently wash with scrungie or clean wascloth    5.  Apply the CHG Soap to your body ONLY FROM THE NECK DOWN.   Do not use on open                           Wound or open sores. Avoid contact with eyes, ears mouth and genitals (private parts).                        Genitals (private parts) with your normal soap.              6.  Wash thoroughly, paying special attention to the area where your surgery  will be performed.   7.  Thoroughly rinse your body with warm water from the neck down.   8.  DO NOT shower/wash with your  normal soap after using and rinsing off  the CHG Soap .                9.  Pat yourself dry with a clean towel.  10.  Wear clean pajamas.             11.  Place clean sheets on your bed the night of your first shower and do not  sleep with pets.  Day of Surgery : Do not apply any lotions/deodorants the morning of surgery.  Please wear clean clothes to the hospital/surgery center.  FAILURE TO FOLLOW THESE INSTRUCTIONS MAY RESULT IN THE CANCELLATION OF YOUR SURGERY    PATIENT SIGNATURE_________________________________  ______________________________________________________________________     Adam Phenix  An incentive spirometer is a tool that can help keep your lungs clear and active. This tool measures how well you are filling your lungs with each breath. Taking long deep breaths may help reverse or decrease the chance of developing breathing (pulmonary) problems (especially infection) following:  A long period of time when you are unable to move or be active. BEFORE THE PROCEDURE   If the spirometer includes an indicator to show your best effort, your nurse or respiratory therapist will set it to a desired goal.  If possible, sit up straight or lean slightly forward. Try not to slouch.  Hold the incentive spirometer in an upright position. INSTRUCTIONS FOR USE  1. Sit on the edge of your bed if possible, or sit up as far as you can in bed or on a chair. 2. Hold the incentive spirometer in an upright position. 3. Breathe out normally. 4. Place the mouthpiece in your mouth and seal your lips tightly around it. 5. Breathe in slowly and as deeply as possible, raising the piston or the ball toward the top of the column. 6. Hold your breath for 3-5 seconds or for as long as possible. Allow the piston or ball to fall to the bottom of the column. 7. Remove the mouthpiece from your mouth and breathe out normally. 8. Rest for a few seconds and repeat Steps 1  through 7 at least 10 times every 1-2 hours when you are awake. Take your time and take a few normal breaths between deep breaths. 9. The spirometer may include an indicator to show your best effort. Use the indicator as a goal to work toward during each repetition. 10. After each set of 10 deep breaths, practice coughing to be sure your lungs are clear. If you have an incision (the cut made at the time of surgery), support your incision when coughing by placing a pillow or rolled up towels firmly against it. Once you are able to get out of bed, walk around indoors and cough well. You may stop using the incentive spirometer when instructed by your caregiver.  RISKS AND COMPLICATIONS  Take your time so you do not get dizzy or light-headed.  If you are in pain, you may need to take or ask for pain medication before doing incentive spirometry. It is harder to take a deep breath if you are having pain. AFTER USE  Rest and breathe slowly and easily.  It can be helpful to keep track of a log of your progress. Your caregiver can provide you with a simple table to help with this. If you are using the spirometer at home, follow these instructions: [place] IF:   You are having difficultly using the spirometer.  You have trouble using the spirometer as often as instructed.  Your pain medication is not giving enough relief while using the spirometer.  You develop fever of 100.5 F (38.1 C) or higher. SEEK IMMEDIATE MEDICAL CARE IF:   You  cough up bloody sputum that had not been present before.  You develop fever of 102 F (38.9 C) or greater.  You develop worsening pain at or near the incision site. MAKE SURE YOU:   Understand these instructions.  Will watch your condition.  Will get help right away if you are not doing well or get worse. Document Released: 08/02/2006 Document Revised: 06/14/2011 Document Reviewed: 10/03/2006 [facility] Patient Information 2014 [place],  [place].   ________________________________________________________________________

## 2014-04-15 ENCOUNTER — Encounter (HOSPITAL_COMMUNITY): Payer: Self-pay

## 2014-04-15 ENCOUNTER — Inpatient Hospital Stay (HOSPITAL_COMMUNITY): Admission: RE | Admit: 2014-04-15 | Payer: Medicare Other | Source: Ambulatory Visit

## 2014-04-15 ENCOUNTER — Other Ambulatory Visit: Payer: Self-pay | Admitting: Dermatology

## 2014-04-15 ENCOUNTER — Encounter (HOSPITAL_COMMUNITY)
Admission: RE | Admit: 2014-04-15 | Discharge: 2014-04-15 | Disposition: A | Discharge status: Home / Self Care | Payer: Medicare Other | Source: Ambulatory Visit | Attending: Orthopedic Surgery | Admitting: Orthopedic Surgery

## 2014-04-15 DIAGNOSIS — D1801 Hemangioma of skin and subcutaneous tissue: Secondary | ICD-10-CM | POA: Diagnosis not present

## 2014-04-15 DIAGNOSIS — L821 Other seborrheic keratosis: Secondary | ICD-10-CM | POA: Diagnosis not present

## 2014-04-15 DIAGNOSIS — M23322 Other meniscus derangements, posterior horn of medial meniscus, left knee: Secondary | ICD-10-CM | POA: Diagnosis not present

## 2014-04-15 DIAGNOSIS — M25562 Pain in left knee: Secondary | ICD-10-CM | POA: Diagnosis present

## 2014-04-15 DIAGNOSIS — Z85828 Personal history of other malignant neoplasm of skin: Secondary | ICD-10-CM | POA: Diagnosis not present

## 2014-04-15 DIAGNOSIS — E780 Pure hypercholesterolemia: Secondary | ICD-10-CM | POA: Diagnosis not present

## 2014-04-15 DIAGNOSIS — I2510 Atherosclerotic heart disease of native coronary artery without angina pectoris: Secondary | ICD-10-CM | POA: Diagnosis not present

## 2014-04-15 DIAGNOSIS — K219 Gastro-esophageal reflux disease without esophagitis: Secondary | ICD-10-CM | POA: Diagnosis not present

## 2014-04-15 DIAGNOSIS — I10 Essential (primary) hypertension: Secondary | ICD-10-CM | POA: Diagnosis not present

## 2014-04-15 DIAGNOSIS — D485 Neoplasm of uncertain behavior of skin: Secondary | ICD-10-CM | POA: Diagnosis not present

## 2014-04-15 DIAGNOSIS — G4730 Sleep apnea, unspecified: Secondary | ICD-10-CM | POA: Diagnosis not present

## 2014-04-15 DIAGNOSIS — L570 Actinic keratosis: Secondary | ICD-10-CM | POA: Diagnosis not present

## 2014-04-15 DIAGNOSIS — N400 Enlarged prostate without lower urinary tract symptoms: Secondary | ICD-10-CM | POA: Diagnosis not present

## 2014-04-15 HISTORY — DX: Unspecified tear of unspecified meniscus, current injury, unspecified knee, initial encounter: S83.209A

## 2014-04-15 LAB — BASIC METABOLIC PANEL
Anion gap: 7 (ref 5–15)
BUN: 21 mg/dL (ref 6–23)
CO2: 27 mmol/L (ref 19–32)
Calcium: 9.7 mg/dL (ref 8.4–10.5)
Chloride: 106 mEq/L (ref 96–112)
Creatinine, Ser: 0.97 mg/dL (ref 0.50–1.35)
GFR calc non Af Amer: 82 mL/min — ABNORMAL LOW (ref >90)
Glucose, Bld: 111 mg/dL — ABNORMAL HIGH (ref 70–99)
Potassium: 4.6 mmol/L (ref 3.5–5.1)
Sodium: 140 mmol/L (ref 135–145)

## 2014-04-15 LAB — CBC
HCT: 41 % (ref 39.0–52.0)
Hemoglobin: 14.2 g/dL (ref 13.0–17.0)
MCH: 31.3 pg (ref 26.0–34.0)
MCHC: 34.6 g/dL (ref 30.0–36.0)
MCV: 90.5 fL (ref 78.0–100.0)
PLATELETS: 213 10*3/uL (ref 150–400)
RBC: 4.53 MIL/uL (ref 4.22–5.81)
RDW: 12.7 % (ref 11.5–15.5)
WBC: 8 10*3/uL (ref 4.0–10.5)

## 2014-04-16 DIAGNOSIS — S83249A Other tear of medial meniscus, current injury, unspecified knee, initial encounter: Secondary | ICD-10-CM | POA: Diagnosis present

## 2014-04-16 MED ORDER — VANCOMYCIN HCL 10 G IV SOLR
1500.0000 mg | INTRAVENOUS | Status: AC
  Administered 2014-04-17: 1500 mg via INTRAVENOUS
  Filled 2014-04-16 (×2): qty 1500

## 2014-04-16 NOTE — H&P (Signed)
  CC- Ralph Garrett is a 69 y.o. male who presents with left knee pain.  HPI- . Knee Pain: Patient presents with knee pain involving the  left knee. Onset of the symptoms was several weeks ago. Inciting event: none known. Current symptoms include giving out, pain located medially, popping sensation and stiffness. Pain is aggravated by lateral movements, pivoting, rising after sitting, squatting, standing and walking.  Patient has had no prior knee problems. Evaluation to date: MRI: abnormal medial meniscal tears. Treatment to date: rest.  Past Medical History  Diagnosis Date  . Hypertension   . BPH (benign prostatic hyperplasia)   . Sleep apnea     wears cpap  . Coronary artery disease   . Hypercholesteremia   . Obesity   . GERD (gastroesophageal reflux disease)   . Diverticulosis of colon   . Hx of colonic polyps   . Increased prostate specific antigen (PSA) velocity   . History of bacterial meningitis 1987  . Peroneal muscular atrophy   . Meniscus tear     left knee    Past Surgical History  Procedure Laterality Date  . Colonoscopy  02/2004    Dr.Streck  . Cholecystectomy    . Trigger finger release  2009    right hand--Dr Burney Gauze    Prior to Admission medications   Medication Sig Start Date End Date Taking? Authorizing Provider  aspirin 81 MG tablet Take 81 mg by mouth daily.    Yes Historical Provider, MD  DiphenhydrAMINE HCl (BENADRYL ALLERGY PO) Take 1 tablet by mouth at bedtime.    Yes Historical Provider, MD  finasteride (PROSCAR) 5 MG tablet Take 1 tablet (5 mg total) by mouth daily. 11/27/13  Yes Noralee Space, MD  lisinopril (PRINIVIL,ZESTRIL) 20 MG tablet Take 1 tablet (20 mg total) by mouth daily. 11/27/13  Yes Noralee Space, MD  naproxen sodium (ANAPROX) 220 MG tablet Take 220 mg by mouth 2 (two) times daily with a meal.   Yes Historical Provider, MD    antalgic gait, soft tissue tenderness over medial joint line, reduced range of motion, negative pivot-shift,  collateral ligaments intact  Physical Examination: General appearance - alert, well appearing, and in no distress Mental status - alert, oriented to person, place, and time Chest - clear to auscultation, no wheezes, rales or rhonchi, symmetric air entry Heart - normal rate, regular rhythm, normal S1, S2, no murmurs, rubs, clicks or gallops Abdomen - soft, nontender, nondistended, no masses or organomegaly Neurological - alert, oriented, normal speech, no focal findings or movement disorder noted   Asessment/Plan--- Left knee medial meniscal tear- - Plan left knee arthroscopy with meniscal debridement. Procedure risks and potential comps discussed with patient who elects to proceed. Goals are decreased pain and increased function with a high likelihood of achieving both

## 2014-04-17 ENCOUNTER — Ambulatory Visit (HOSPITAL_COMMUNITY)
Admission: RE | Admit: 2014-04-17 | Discharge: 2014-04-17 | Disposition: A | Discharge status: Home / Self Care | Payer: Medicare Other | Source: Ambulatory Visit | Attending: Orthopedic Surgery | Admitting: Orthopedic Surgery

## 2014-04-17 ENCOUNTER — Ambulatory Visit (HOSPITAL_COMMUNITY): Payer: Medicare Other | Admitting: Anesthesiology

## 2014-04-17 ENCOUNTER — Encounter (HOSPITAL_COMMUNITY)
Admission: RE | Disposition: A | Discharge status: Home / Self Care | Payer: Self-pay | Source: Ambulatory Visit | Attending: Orthopedic Surgery

## 2014-04-17 ENCOUNTER — Encounter (HOSPITAL_COMMUNITY): Payer: Self-pay | Admitting: *Deleted

## 2014-04-17 DIAGNOSIS — I2510 Atherosclerotic heart disease of native coronary artery without angina pectoris: Secondary | ICD-10-CM | POA: Insufficient documentation

## 2014-04-17 DIAGNOSIS — K219 Gastro-esophageal reflux disease without esophagitis: Secondary | ICD-10-CM | POA: Insufficient documentation

## 2014-04-17 DIAGNOSIS — S83249A Other tear of medial meniscus, current injury, unspecified knee, initial encounter: Secondary | ICD-10-CM | POA: Diagnosis present

## 2014-04-17 DIAGNOSIS — I10 Essential (primary) hypertension: Secondary | ICD-10-CM | POA: Insufficient documentation

## 2014-04-17 DIAGNOSIS — G4730 Sleep apnea, unspecified: Secondary | ICD-10-CM | POA: Insufficient documentation

## 2014-04-17 DIAGNOSIS — M23322 Other meniscus derangements, posterior horn of medial meniscus, left knee: Secondary | ICD-10-CM | POA: Diagnosis not present

## 2014-04-17 DIAGNOSIS — S83242A Other tear of medial meniscus, current injury, left knee, initial encounter: Secondary | ICD-10-CM | POA: Diagnosis not present

## 2014-04-17 DIAGNOSIS — N400 Enlarged prostate without lower urinary tract symptoms: Secondary | ICD-10-CM | POA: Diagnosis not present

## 2014-04-17 DIAGNOSIS — E780 Pure hypercholesterolemia: Secondary | ICD-10-CM | POA: Diagnosis not present

## 2014-04-17 HISTORY — PX: KNEE ARTHROSCOPY: SHX127

## 2014-04-17 SURGERY — ARTHROSCOPY, KNEE
Anesthesia: General | Site: Knee | Laterality: Left

## 2014-04-17 MED ORDER — FENTANYL CITRATE 0.05 MG/ML IJ SOLN: 25.0000 ug | INTRAMUSCULAR | Status: DC | PRN

## 2014-04-17 MED ORDER — CHLORHEXIDINE GLUCONATE 4 % EX LIQD: 60.0000 mL | Freq: Once | CUTANEOUS | Status: DC

## 2014-04-17 MED ORDER — LACTATED RINGERS IR SOLN
Status: DC | PRN
  Administered 2014-04-17: 6000 mL

## 2014-04-17 MED ORDER — METHOCARBAMOL 500 MG PO TABS: 500.0000 mg | ORAL_TABLET | Freq: Four times a day (QID) | ORAL | Status: DC

## 2014-04-17 MED ORDER — HYDROCODONE-ACETAMINOPHEN 5-325 MG PO TABS: 1.0000 | ORAL_TABLET | Freq: Four times a day (QID) | ORAL | Status: DC | PRN

## 2014-04-17 MED ORDER — FENTANYL CITRATE 0.05 MG/ML IJ SOLN
INTRAMUSCULAR | Status: DC | PRN
  Administered 2014-04-17 (×3): 50 ug via INTRAVENOUS

## 2014-04-17 MED ORDER — HYDROCODONE-ACETAMINOPHEN 5-325 MG PO TABS
ORAL_TABLET | ORAL | Status: AC
  Filled 2014-04-17: qty 1

## 2014-04-17 MED ORDER — ACETAMINOPHEN 10 MG/ML IV SOLN
1000.0000 mg | Freq: Once | INTRAVENOUS | Status: AC
  Administered 2014-04-17: 1000 mg via INTRAVENOUS
  Filled 2014-04-17 (×2): qty 100

## 2014-04-17 MED ORDER — PROMETHAZINE HCL 25 MG/ML IJ SOLN: 6.2500 mg | INTRAMUSCULAR | Status: DC | PRN

## 2014-04-17 MED ORDER — PROPOFOL 10 MG/ML IV BOLUS
INTRAVENOUS | Status: DC | PRN
  Administered 2014-04-17: 150 mg via INTRAVENOUS

## 2014-04-17 MED ORDER — LACTATED RINGERS IV SOLN
INTRAVENOUS | Status: DC
  Administered 2014-04-17: 16:00:00 via INTRAVENOUS

## 2014-04-17 MED ORDER — BUPIVACAINE-EPINEPHRINE (PF) 0.25% -1:200000 IJ SOLN
INTRAMUSCULAR | Status: AC
  Filled 2014-04-17: qty 30

## 2014-04-17 MED ORDER — FENTANYL CITRATE 0.05 MG/ML IJ SOLN
INTRAMUSCULAR | Status: AC
  Filled 2014-04-17: qty 2

## 2014-04-17 MED ORDER — MIDAZOLAM HCL 2 MG/2ML IJ SOLN
INTRAMUSCULAR | Status: AC
Start: 2014-04-17 — End: 2014-04-17
  Filled 2014-04-17: qty 2

## 2014-04-17 MED ORDER — PROPOFOL 10 MG/ML IV BOLUS
INTRAVENOUS | Status: AC
  Filled 2014-04-17: qty 20

## 2014-04-17 MED ORDER — BUPIVACAINE-EPINEPHRINE 0.25% -1:200000 IJ SOLN
INTRAMUSCULAR | Status: DC | PRN
  Administered 2014-04-17: 20 mL

## 2014-04-17 MED ORDER — MEPERIDINE HCL 50 MG/ML IJ SOLN: 6.2500 mg | INTRAMUSCULAR | Status: DC | PRN

## 2014-04-17 MED ORDER — SODIUM CHLORIDE 0.9 % IV SOLN: INTRAVENOUS | Status: DC

## 2014-04-17 MED ORDER — DEXAMETHASONE SODIUM PHOSPHATE 10 MG/ML IJ SOLN: 10.0000 mg | Freq: Once | INTRAMUSCULAR | Status: DC

## 2014-04-17 MED ORDER — HYDROCODONE-ACETAMINOPHEN 5-325 MG PO TABS
1.0000 | ORAL_TABLET | Freq: Four times a day (QID) | ORAL | Status: DC | PRN
  Administered 2014-04-17: 1 via ORAL

## 2014-04-17 MED ORDER — MIDAZOLAM HCL 5 MG/5ML IJ SOLN
INTRAMUSCULAR | Status: DC | PRN
  Administered 2014-04-17: 2 mg via INTRAVENOUS

## 2014-04-17 SURGICAL SUPPLY — 26 items
BANDAGE ELASTIC 6 VELCRO ST LF (GAUZE/BANDAGES/DRESSINGS) ×2 IMPLANT
BLADE 4.2CUDA (BLADE) ×2 IMPLANT
BLADE SURG SZ11 CARB STEEL (BLADE) IMPLANT
CUFF TOURN SGL QUICK 34 (TOURNIQUET CUFF) ×1
CUFF TRNQT CYL 34X4X40X1 (TOURNIQUET CUFF) ×1 IMPLANT
DRAPE U-SHAPE 47X51 STRL (DRAPES) ×2 IMPLANT
DRSG EMULSION OIL 3X3 NADH (GAUZE/BANDAGES/DRESSINGS) ×2 IMPLANT
DRSG PAD ABDOMINAL 8X10 ST (GAUZE/BANDAGES/DRESSINGS) ×4 IMPLANT
DURAPREP 26ML APPLICATOR (WOUND CARE) ×2 IMPLANT
GAUZE SPONGE 4X4 12PLY STRL (GAUZE/BANDAGES/DRESSINGS) ×2 IMPLANT
GLOVE BIO SURGEON STRL SZ8 (GLOVE) ×2 IMPLANT
GLOVE BIOGEL PI IND STRL 8 (GLOVE) ×1 IMPLANT
GLOVE BIOGEL PI INDICATOR 8 (GLOVE) ×1
GOWN STRL REUS W/TWL LRG LVL3 (GOWN DISPOSABLE) ×2 IMPLANT
KIT BASIN OR (CUSTOM PROCEDURE TRAY) ×2 IMPLANT
MANIFOLD NEPTUNE II (INSTRUMENTS) ×2 IMPLANT
PACK ARTHROSCOPY WL (CUSTOM PROCEDURE TRAY) ×2 IMPLANT
PACK ICE MAXI GEL EZY WRAP (MISCELLANEOUS) ×6 IMPLANT
PADDING CAST COTTON 6X4 STRL (CAST SUPPLIES) ×2 IMPLANT
POSITIONER SURGICAL ARM (MISCELLANEOUS) ×2 IMPLANT
SET ARTHROSCOPY TUBING (MISCELLANEOUS) ×1
SET ARTHROSCOPY TUBING LN (MISCELLANEOUS) ×1 IMPLANT
SUT ETHILON 4 0 PS 2 18 (SUTURE) ×2 IMPLANT
TOWEL OR 17X26 10 PK STRL BLUE (TOWEL DISPOSABLE) ×2 IMPLANT
WAND 90 DEG TURBOVAC W/CORD (SURGICAL WAND) ×2 IMPLANT
WRAP KNEE MAXI GEL POST OP (GAUZE/BANDAGES/DRESSINGS) ×2 IMPLANT

## 2014-04-17 NOTE — Anesthesia Preprocedure Evaluation (Signed)
Anesthesia Evaluation  Patient identified by MRN, date of birth, ID band Patient awake    Reviewed: Allergy & Precautions, NPO status , Patient's Chart, lab work & pertinent test results  Airway Mallampati: II  TM Distance: >3 FB Neck ROM: Full    Dental no notable dental hx.    Pulmonary sleep apnea and Continuous Positive Airway Pressure Ventilation , former smoker,  breath sounds clear to auscultation  Pulmonary exam normal       Cardiovascular hypertension, Pt. on medications - CAD Rhythm:Regular Rate:Normal     Neuro/Psych negative neurological ROS  negative psych ROS   GI/Hepatic Neg liver ROS, GERD-  Controlled,  Endo/Other  negative endocrine ROS  Renal/GU negative Renal ROS  negative genitourinary   Musculoskeletal negative musculoskeletal ROS (+)   Abdominal   Peds negative pediatric ROS (+)  Hematology negative hematology ROS (+)   Anesthesia Other Findings   Reproductive/Obstetrics negative OB ROS                             Anesthesia Physical Anesthesia Plan  ASA: III  Anesthesia Plan: General   Post-op Pain Management:    Induction: Intravenous  Airway Management Planned: LMA  Additional Equipment:   Intra-op Plan:   Post-operative Plan: Extubation in OR  Informed Consent: I have reviewed the patients History and Physical, chart, labs and discussed the procedure including the risks, benefits and alternatives for the proposed anesthesia with the patient or authorized representative who has indicated his/her understanding and acceptance.   Dental advisory given  Plan Discussed with: CRNA  Anesthesia Plan Comments:         Anesthesia Quick Evaluation

## 2014-04-17 NOTE — Discharge Instructions (Signed)
Arthroscopic Procedure, Knee An arthroscopic procedure can find what is wrong with your knee. PROCEDURE Arthroscopy is a surgical technique that allows your orthopedic surgeon to diagnose and treat your knee injury with accuracy. They will look into your knee through a small instrument. This is almost like a small (pencil sized) telescope. Because arthroscopy affects your knee less than open knee surgery, you can anticipate a more rapid recovery. Taking an active role by following your caregiver's instructions will help with rapid and complete recovery. Use crutches, rest, elevation, ice, and knee exercises as instructed. The length of recovery depends on various factors including type of injury, age, physical condition, medical conditions, and your rehabilitation. Your knee is the joint between the large bones (femur and tibia) in your leg. Cartilage covers these bone ends which are smooth and slippery and allow your knee to bend and move smoothly. Two menisci, thick, semi-lunar shaped pads of cartilage which form a rim inside the joint, help absorb shock and stabilize your knee. Ligaments bind the bones together and support your knee joint. Muscles move the joint, help support your knee, and take stress off the joint itself. Because of this all programs and physical therapy to rehabilitate an injured or repaired knee require rebuilding and strengthening your muscles. AFTER THE PROCEDURE  After the procedure, you will be moved to a recovery area until most of the effects of the medication have worn off. Your caregiver will discuss the test results with you.   Only take over-the-counter or prescription medicines for pain, discomfort, or fever as directed by your caregiver.  SEEK MEDICAL CARE IF:   You have increased bleeding from your wounds.   You see redness, swelling, or have increasing pain in your wounds.   You have pus coming from your wound.   You have an oral temperature above 102 F  (38.9 C).   You notice a bad smell coming from the wound or dressing.   You have severe pain with any motion of your knee.  SEEK IMMEDIATE MEDICAL CARE IF:   You develop a rash.   You have difficulty breathing.   You have any allergic problems.  FURTHER INSTRUCTIONS:  You may start showering two days after being discharged home but do not submerge the incisions under water.   Change dressing 48 hours after the procedure and then cover the small incisions with band aids until your follow up visit.  Avoid periods of inactivity such as sitting longer than an hour when not asleep. This helps prevent blood clots.   You may put full weight on your legs and walk as much as is comfortable.   Do not drive while taking narcotics.  Wear the elastic stockings for three weeks following surgery during the day but you may remove then at night.  Make sure you keep all of your appointments after your operation with all of your doctors and caregivers. You should call the office at [phone] and make an appointment for approximately one week after the date of your surgery.  Please pick up a stool softener and laxative for home use as long as you are requiring pain medications.  ICE to the affected knee every three hours for 30 minutes at a time and then as needed for pain and swelling.  Continue to use ice on the knee for pain and swelling from surgery. You may notice swelling that will progress down to the foot and ankle.  This is normal after surgery.  Elevate the  leg when you are not up walking on it.   RANGE OF MOTION AND STRENGTHENING EXERCISES  Rehabilitation of the knee is important following a knee injury or an operation. After just a few days of immobilization, the muscles of the thigh which control the knee become weakened and shrink (atrophy). Knee exercises are designed to build up the tone and strength of the thigh muscles and to improve knee motion. Often times heat used for twenty to  thirty minutes before working out will loosen up your tissues and help with improving the range of motion but do not use heat for the first two weeks following surgery. These exercises can be done on a training (exercise) mat, on the floor, on a table or on a bed. Use what ever works the best and is most comfortable for you Knee exercises include:       QUAD STRENGTHENING EXERCISES Strengthening Quadriceps Sets  Tighten muscles on top of thigh by pushing knees down into floor or table. Hold for 20 seconds. Repeat 10 times. Do 2 sessions per day.    Strengthening Terminal Knee Extension  With knee bent over bolster, straighten knee by tightening muscle on top of thigh. Be sure to keep bottom of knee on bolster. Hold for 20 seconds. Repeat 10 times. Do 2 sessions per day.   Straight Leg with Bent Knee  Lie on back with opposite leg bent. Keep involved knee slightly bent at knee and raise leg 4-6". Hold for 10 seconds. Repeat 20 times per set. Do 2 sets per session. Do 2 sessions per day.      General Anesthesia, Care After Refer to this sheet in the next few weeks. These instructions provide you with information on caring for yourself after your procedure. Your health care provider may also give you more specific instructions. Your treatment has been planned according to current medical practices, but problems sometimes occur. Call your health care provider if you have any problems or questions after your procedure. WHAT TO EXPECT AFTER THE PROCEDURE After the procedure, it is typical to experience:  Sleepiness.  Nausea and vomiting. HOME CARE INSTRUCTIONS  For the first 24 hours after general anesthesia:  Have a responsible person with you.  Do not drive a car. If you are alone, do not take public transportation.  Do not drink alcohol.  Do not take medicine that has not been prescribed by your health care provider.  Do not sign important papers or make important  decisions.  You may resume a normal diet and activities as directed by your health care provider.  Change bandages (dressings) as directed.  If you have questions or problems that seem related to general anesthesia, call the hospital and ask for the anesthetist or anesthesiologist on call. SEEK MEDICAL CARE IF:  You have nausea and vomiting that continue the day after anesthesia.  You develop a rash. SEEK IMMEDIATE MEDICAL CARE IF:   You have difficulty breathing.  You have chest pain.  You have any allergic problems. Document Released: 06/28/2000 Document Revised: 03/27/2013 Document Reviewed: 10/05/2012 [facility] Patient Information 2015 [place], [place]. This information is not intended to replace advice given to you by your health care provider. Make sure you discuss any questions you have with your health care provider.

## 2014-04-17 NOTE — Anesthesia Procedure Notes (Signed)
Procedure Name: LMA Insertion Date/Time: 04/17/2014 5:52 PM Performed by: Johnathan Hausen A Pre-anesthesia Checklist: Patient identified, Timeout performed, Emergency Drugs available, Suction available and Patient being monitored Patient Re-evaluated:Patient Re-evaluated prior to inductionOxygen Delivery Method: Circle system utilized Preoxygenation: Pre-oxygenation with 100% oxygen Intubation Type: Combination inhalational/ intravenous induction Ventilation: Nasal airway inserted- appropriate to patient size and Mask ventilation without difficulty LMA: LMA with gastric port inserted LMA Size: 5.0 Number of attempts: 1 Placement Confirmation: positive ETCO2 Dental Injury: Teeth and Oropharynx as per pre-operative assessment

## 2014-04-17 NOTE — Op Note (Signed)
Preoperative diagnosis-  Left knee medial meniscal tear  Postoperative diagnosis Left- knee medial meniscal tear   Procedure- Left knee arthroscopy with medial meniscal debridement    Surgeon- Dione Plover. Channell Quattrone, MD  Anesthesia-General  EBL-  Minimal  Complications- None  Condition- PACU - hemodynamically stable.  Brief clinical note- -Ralph Garrett is a 69 y.o.  male with a several week history of left knee pain and mechanical symptoms. Exam and history suggested medial meniscal tear confirmed by MRI. The patient presents now for arthroscopy and debridement  Procedure in detail -       After successful administration of General anesthetic, a tourmiquet is placed high on the Left  thigh and the Left lower extremity is prepped and draped in the usual sterile fashion. Time out is performed by the surgical team. Standard superomedial and inferolateral portal sites are marked and incisions made with an 11 blade. The inflow cannula is passed through the superomedial portal and camera through the inferolateral portal and inflow is initiated. Arthroscopic visualization proceeds.      The undersurface of the patella and trochlea are visualized and they are normal. The medial and lateral gutters are visualized and there are  no loose bodies. Flexion and valgus force is applied to the knee and the medial compartment is entered. A spinal needle is passed into the joint through the site marked for the inferomedial portal. A small incision is made and the dilator passed into the joint. The findings for the medial compartment are unstable tear of body and posterior horn of the medial meniscus with an unstable flap . The tear is debrided to a stable base with baskets and a shaver and sealed off with the Arthrocare.   It is probed and found to be stable. The chondral surfaces are normal.    The intercondylar notch is visualized and the ACL appears normal. The lateral compartment is entered and the findings  are normal .       The joint is again inspected and there are no other tears, defects or loose bodies identified. The arthroscopic equipment is then removed from the inferior portals which are closed with interrupted 4-0 nylon. 20 ml of .25% Marcaine with epinephrine are injected through the inflow cannula and the cannula is then removed and the portal closed with nylon. The incisions are cleaned and dried and a bulky sterile dressing is applied. The patient is then awakened and transported to recovery in stable condition.   04/17/2014, 6:25 PM

## 2014-04-17 NOTE — Transfer of Care (Signed)
Immediate Anesthesia Transfer of Care Note  Patient: Ralph Garrett  Procedure(s) Performed: Procedure(s): LEFT KNEE ARTHROSCOPY WITH MEDIAL MENISCAL DEBRIDEMENT   (Left)  Patient Location: PACU  Anesthesia Type:General  Level of Consciousness: awake, sedated and patient cooperative  Airway & Oxygen Therapy: Patient Spontanous Breathing and Patient connected to face mask oxygen  Post-op Assessment: Report given to PACU RN and Post -op Vital signs reviewed and stable  Post vital signs: Reviewed and stable  Complications: No apparent anesthesia complications

## 2014-04-18 ENCOUNTER — Encounter (HOSPITAL_COMMUNITY): Payer: Self-pay | Admitting: Orthopedic Surgery

## 2014-04-18 NOTE — Anesthesia Postprocedure Evaluation (Signed)
  Anesthesia Post-op Note  Patient: Ralph Garrett  Procedure(s) Performed: Procedure(s) (LRB): LEFT KNEE ARTHROSCOPY WITH MEDIAL MENISCAL DEBRIDEMENT   (Left)  Patient Location: PACU  Anesthesia Type: General  Level of Consciousness: awake and alert   Airway and Oxygen Therapy: Patient Spontanous Breathing  Post-op Pain: mild  Post-op Assessment: Post-op Vital signs reviewed, Patient's Cardiovascular Status Stable, Respiratory Function Stable, Patent Airway and No signs of Nausea or vomiting  Last Vitals:  Filed Vitals:   04/17/14 1954  BP: 169/79  Pulse: 59  Temp: 36.3 C  Resp: 16    Post-op Vital Signs: stable   Complications: No apparent anesthesia complications

## 2014-04-23 DIAGNOSIS — S83232D Complex tear of medial meniscus, current injury, left knee, subsequent encounter: Secondary | ICD-10-CM | POA: Diagnosis not present

## 2014-06-04 DIAGNOSIS — M65331 Trigger finger, right middle finger: Secondary | ICD-10-CM | POA: Diagnosis not present

## 2014-06-04 DIAGNOSIS — M65332 Trigger finger, left middle finger: Secondary | ICD-10-CM | POA: Diagnosis not present

## 2014-06-04 DIAGNOSIS — S83232D Complex tear of medial meniscus, current injury, left knee, subsequent encounter: Secondary | ICD-10-CM | POA: Diagnosis not present

## 2014-06-04 DIAGNOSIS — Z4789 Encounter for other orthopedic aftercare: Secondary | ICD-10-CM | POA: Diagnosis not present

## 2014-06-06 DIAGNOSIS — R972 Elevated prostate specific antigen [PSA]: Secondary | ICD-10-CM | POA: Diagnosis not present

## 2014-06-20 DIAGNOSIS — N138 Other obstructive and reflux uropathy: Secondary | ICD-10-CM | POA: Diagnosis not present

## 2014-06-20 DIAGNOSIS — N401 Enlarged prostate with lower urinary tract symptoms: Secondary | ICD-10-CM | POA: Diagnosis not present

## 2014-06-20 DIAGNOSIS — R972 Elevated prostate specific antigen [PSA]: Secondary | ICD-10-CM | POA: Diagnosis not present

## 2014-07-25 ENCOUNTER — Encounter: Payer: Self-pay | Admitting: Pulmonary Disease

## 2014-07-25 ENCOUNTER — Telehealth: Payer: Self-pay | Admitting: Pulmonary Disease

## 2014-07-25 ENCOUNTER — Ambulatory Visit (INDEPENDENT_AMBULATORY_CARE_PROVIDER_SITE_OTHER): Payer: Medicare Other | Admitting: Pulmonary Disease

## 2014-07-25 VITALS — BP 142/80 | HR 76 | Temp 97.1°F | Wt 287.0 lb

## 2014-07-25 DIAGNOSIS — B029 Zoster without complications: Secondary | ICD-10-CM | POA: Diagnosis not present

## 2014-07-25 MED ORDER — FAMCICLOVIR 500 MG PO TABS: 500.0000 mg | ORAL_TABLET | Freq: Three times a day (TID) | ORAL | Status: DC

## 2014-07-25 MED ORDER — METHYLPREDNISOLONE 4 MG PO TBPK: ORAL_TABLET | ORAL | Status: DC

## 2014-07-25 NOTE — Telephone Encounter (Addendum)
Spoke with pt. States that he was bitten by a spider on his L face. Feels like this is becoming infected. Blisters are forming and it is spreading back towards his ear. Has been applying alcohol to area. Would like SN's recommendations.  Current Outpatient Prescriptions on File Prior to Visit  Medication Sig Dispense Refill  . aspirin 81 MG tablet Take 81 mg by mouth daily.     . DiphenhydrAMINE HCl (BENADRYL ALLERGY PO) Take 1 tablet by mouth at bedtime.     . finasteride (PROSCAR) 5 MG tablet Take 1 tablet (5 mg total) by mouth daily. 90 tablet 3  . HYDROcodone-acetaminophen (NORCO) 5-325 MG per tablet Take 1-2 tablets by mouth every 6 (six) hours as needed for moderate pain. 30 tablet 0  . lisinopril (PRINIVIL,ZESTRIL) 20 MG tablet Take 1 tablet (20 mg total) by mouth daily. 90 tablet 3  . methocarbamol (ROBAXIN) 500 MG tablet Take 1 tablet (500 mg total) by mouth 4 (four) times daily. As needed for muscle spasm 30 tablet 1  . naproxen sodium (ANAPROX) 220 MG tablet Take 220 mg by mouth 2 (two) times daily with a meal.     No current facility-administered medications on file prior to visit.    SN - please advise. Thanks.

## 2014-07-25 NOTE — Telephone Encounter (Signed)
Message printed and placed on  SN cart for review  

## 2014-07-25 NOTE — Patient Instructions (Signed)
Today we updated your med list in our EPIC system...    Continue your current medications the same...  This looks like SHINGLEs involving the 5th cranial nerve, mandibular branch on left...    Take the [facility] 500mg  - one tab three times daily til gone...    Take the Medrol dosepak as directed on the pack...  Call for any questions.Marland KitchenMarland Kitchen

## 2014-07-25 NOTE — Telephone Encounter (Signed)
Per SN, pt called and instructed to come in this afternoon if he feels that the spider bite is not getting better. Pt stated that he could be here at 2:30pm. Nothing further is needed at this time.
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
## 2014-08-28 DIAGNOSIS — J209 Acute bronchitis, unspecified: Secondary | ICD-10-CM | POA: Diagnosis not present

## 2014-09-24 DIAGNOSIS — M65332 Trigger finger, left middle finger: Secondary | ICD-10-CM | POA: Diagnosis not present

## 2014-09-24 DIAGNOSIS — M65341 Trigger finger, right ring finger: Secondary | ICD-10-CM | POA: Diagnosis not present

## 2014-12-03 ENCOUNTER — Other Ambulatory Visit: Payer: Self-pay | Admitting: Pulmonary Disease

## 2015-01-03 DIAGNOSIS — H1089 Other conjunctivitis: Secondary | ICD-10-CM | POA: Diagnosis not present

## 2015-01-06 DIAGNOSIS — Z23 Encounter for immunization: Secondary | ICD-10-CM | POA: Diagnosis not present

## 2015-01-16 DIAGNOSIS — M65332 Trigger finger, left middle finger: Secondary | ICD-10-CM | POA: Diagnosis not present

## 2015-01-16 DIAGNOSIS — M65341 Trigger finger, right ring finger: Secondary | ICD-10-CM | POA: Diagnosis not present

## 2015-01-21 ENCOUNTER — Encounter: Payer: Self-pay | Admitting: Gastroenterology

## 2015-03-01 DIAGNOSIS — S61201A Unspecified open wound of left index finger without damage to nail, initial encounter: Secondary | ICD-10-CM | POA: Diagnosis not present

## 2015-04-16 DIAGNOSIS — D1801 Hemangioma of skin and subcutaneous tissue: Secondary | ICD-10-CM | POA: Diagnosis not present

## 2015-04-16 DIAGNOSIS — Z85828 Personal history of other malignant neoplasm of skin: Secondary | ICD-10-CM | POA: Diagnosis not present

## 2015-04-16 DIAGNOSIS — L821 Other seborrheic keratosis: Secondary | ICD-10-CM | POA: Diagnosis not present

## 2015-04-16 DIAGNOSIS — L570 Actinic keratosis: Secondary | ICD-10-CM | POA: Diagnosis not present

## 2015-04-16 DIAGNOSIS — L918 Other hypertrophic disorders of the skin: Secondary | ICD-10-CM | POA: Diagnosis not present

## 2015-04-22 DIAGNOSIS — Z4789 Encounter for other orthopedic aftercare: Secondary | ICD-10-CM | POA: Diagnosis not present

## 2015-04-22 DIAGNOSIS — M65341 Trigger finger, right ring finger: Secondary | ICD-10-CM | POA: Diagnosis not present

## 2015-04-22 DIAGNOSIS — M65332 Trigger finger, left middle finger: Secondary | ICD-10-CM | POA: Diagnosis not present

## 2015-06-20 DIAGNOSIS — M65332 Trigger finger, left middle finger: Secondary | ICD-10-CM | POA: Diagnosis not present

## 2015-06-20 DIAGNOSIS — M65321 Trigger finger, right index finger: Secondary | ICD-10-CM | POA: Diagnosis not present

## 2015-06-20 DIAGNOSIS — Z4789 Encounter for other orthopedic aftercare: Secondary | ICD-10-CM | POA: Diagnosis not present

## 2015-06-20 DIAGNOSIS — M65341 Trigger finger, right ring finger: Secondary | ICD-10-CM | POA: Diagnosis not present

## 2015-08-04 DIAGNOSIS — M65332 Trigger finger, left middle finger: Secondary | ICD-10-CM | POA: Diagnosis not present

## 2015-10-03 ENCOUNTER — Telehealth: Payer: Self-pay | Admitting: Pulmonary Disease

## 2015-10-03 DIAGNOSIS — I10 Essential (primary) hypertension: Secondary | ICD-10-CM

## 2015-10-03 NOTE — Telephone Encounter (Signed)
Called spoke with pt. He states that he sees SN for a yearly physical and would like to know if it was possible to come in before his ov on 11/10/15 for his labs and cxr. I explained to him that I would need to send a message to SN for his recs. He voiced understanding and had no further questions.    Allergies  Allergen Reactions  . Penicillins     REACTION: rash as a child     Current outpatient prescriptions:  .  aspirin 81 MG tablet, Take 81 mg by mouth daily. , Disp: , Rfl:  .  DiphenhydrAMINE HCl (BENADRYL ALLERGY PO), Take 1 tablet by mouth at bedtime. , Disp: , Rfl:  .  famciclovir (FAMVIR) 500 MG tablet, Take 1 tablet (500 mg total) by mouth 3 (three) times daily., Disp: 21 tablet, Rfl: 0 .  finasteride (PROSCAR) 5 MG tablet, Take 1 tablet (5 mg total) by mouth daily. (Patient not taking: Reported on 07/25/2014), Disp: 90 tablet, Rfl: 3 .  HYDROcodone-acetaminophen (NORCO) 5-325 MG per tablet, Take 1-2 tablets by mouth every 6 (six) hours as needed for moderate pain. (Patient not taking: Reported on 07/25/2014), Disp: 30 tablet, Rfl: 0 .  lisinopril (PRINIVIL,ZESTRIL) 20 MG tablet, take 1 tablet by mouth once daily, Disp: 90 tablet, Rfl: 3 .  methocarbamol (ROBAXIN) 500 MG tablet, Take 1 tablet (500 mg total) by mouth 4 (four) times daily. As needed for muscle spasm (Patient not taking: Reported on 07/25/2014), Disp: 30 tablet, Rfl: 1 .  methylPREDNISolone (MEDROL DOSEPAK) 4 MG TBPK tablet, Use as directed, Disp: 21 tablet, Rfl: 0 .  naproxen sodium (ANAPROX) 220 MG tablet, Take 220 mg by mouth 2 (two) times daily with a meal., Disp: , Rfl:   SN please advise

## 2015-10-03 NOTE — Telephone Encounter (Signed)
Per verbal order from SN Okay to order cxr Lipid, CMET, CBC with Diff, TSH, PSA  Called spoke with pt. Reviewed SN's recs. Pt voiced understanding and had no further questions.

## 2015-11-04 ENCOUNTER — Ambulatory Visit (INDEPENDENT_AMBULATORY_CARE_PROVIDER_SITE_OTHER)
Admission: RE | Admit: 2015-11-04 | Discharge: 2015-11-04 | Disposition: A | Discharge status: Home / Self Care | Payer: Medicare Other | Source: Ambulatory Visit | Attending: Pulmonary Disease | Admitting: Pulmonary Disease

## 2015-11-04 ENCOUNTER — Other Ambulatory Visit (INDEPENDENT_AMBULATORY_CARE_PROVIDER_SITE_OTHER): Payer: Medicare Other

## 2015-11-04 DIAGNOSIS — I10 Essential (primary) hypertension: Secondary | ICD-10-CM | POA: Diagnosis not present

## 2015-11-04 DIAGNOSIS — Z136 Encounter for screening for cardiovascular disorders: Secondary | ICD-10-CM | POA: Diagnosis not present

## 2015-11-04 LAB — LIPID PANEL
CHOLESTEROL: 160 mg/dL (ref 0–200)
HDL: 38.3 mg/dL — AB (ref >39.00)
LDL Cholesterol: 90 mg/dL (ref 0–99)
NonHDL: 121.59
Total CHOL/HDL Ratio: 4
Triglycerides: 156 mg/dL — ABNORMAL HIGH (ref 0.0–149.0)
VLDL: 31.2 mg/dL (ref 0.0–40.0)

## 2015-11-04 LAB — COMPREHENSIVE METABOLIC PANEL
ALBUMIN: 4.1 g/dL (ref 3.5–5.2)
ALK PHOS: 59 U/L (ref 39–117)
ALT: 14 U/L (ref 0–53)
AST: 16 U/L (ref 0–37)
BUN: 16 mg/dL (ref 6–23)
CO2: 29 mEq/L (ref 19–32)
Calcium: 9.6 mg/dL (ref 8.4–10.5)
Chloride: 104 mEq/L (ref 96–112)
Creatinine, Ser: 1.01 mg/dL (ref 0.40–1.50)
GFR: 77.45 mL/min (ref >60.00)
Glucose, Bld: 115 mg/dL — ABNORMAL HIGH (ref 70–99)
POTASSIUM: 4.9 meq/L (ref 3.5–5.1)
Sodium: 140 mEq/L (ref 135–145)
TOTAL PROTEIN: 6.8 g/dL (ref 6.0–8.3)
Total Bilirubin: 0.4 mg/dL (ref 0.2–1.2)

## 2015-11-04 LAB — CBC WITH DIFFERENTIAL/PLATELET
BASOS ABS: 0.1 10*3/uL (ref 0.0–0.1)
Basophils Relative: 0.7 % (ref 0.0–3.0)
EOS PCT: 8.5 % — AB (ref 0.0–5.0)
Eosinophils Absolute: 0.6 10*3/uL (ref 0.0–0.7)
HCT: 40.6 % (ref 39.0–52.0)
HEMOGLOBIN: 13.8 g/dL (ref 13.0–17.0)
Lymphocytes Relative: 29 % (ref 12.0–46.0)
Lymphs Abs: 2.1 10*3/uL (ref 0.7–4.0)
MCHC: 34 g/dL (ref 30.0–36.0)
MCV: 89.9 fl (ref 78.0–100.0)
MONOS PCT: 6.8 % (ref 3.0–12.0)
Monocytes Absolute: 0.5 10*3/uL (ref 0.1–1.0)
Neutro Abs: 3.9 10*3/uL (ref 1.4–7.7)
Neutrophils Relative %: 55 % (ref 43.0–77.0)
Platelets: 252 10*3/uL (ref 150.0–400.0)
RBC: 4.52 Mil/uL (ref 4.22–5.81)
RDW: 13.8 % (ref 11.5–15.5)
WBC: 7.2 10*3/uL (ref 4.0–10.5)

## 2015-11-04 LAB — TSH: TSH: 1.13 u[IU]/mL (ref 0.35–4.50)

## 2015-11-04 LAB — PSA: PSA: 11.45 ng/mL — ABNORMAL HIGH (ref 0.10–4.00)

## 2015-11-10 ENCOUNTER — Ambulatory Visit (INDEPENDENT_AMBULATORY_CARE_PROVIDER_SITE_OTHER): Payer: Medicare Other | Admitting: Pulmonary Disease

## 2015-11-10 ENCOUNTER — Encounter: Payer: Self-pay | Admitting: Pulmonary Disease

## 2015-11-10 VITALS — BP 148/88 | HR 56 | Temp 97.8°F | Ht 73.0 in | Wt 287.0 lb

## 2015-11-10 DIAGNOSIS — I10 Essential (primary) hypertension: Secondary | ICD-10-CM | POA: Diagnosis not present

## 2015-11-10 DIAGNOSIS — G4733 Obstructive sleep apnea (adult) (pediatric): Secondary | ICD-10-CM

## 2015-11-10 DIAGNOSIS — D126 Benign neoplasm of colon, unspecified: Secondary | ICD-10-CM

## 2015-11-10 DIAGNOSIS — I2510 Atherosclerotic heart disease of native coronary artery without angina pectoris: Secondary | ICD-10-CM

## 2015-11-10 DIAGNOSIS — E663 Overweight: Secondary | ICD-10-CM

## 2015-11-10 DIAGNOSIS — E785 Hyperlipidemia, unspecified: Secondary | ICD-10-CM

## 2015-11-10 DIAGNOSIS — N401 Enlarged prostate with lower urinary tract symptoms: Secondary | ICD-10-CM

## 2015-11-10 DIAGNOSIS — N138 Other obstructive and reflux uropathy: Secondary | ICD-10-CM

## 2015-11-10 DIAGNOSIS — M159 Polyosteoarthritis, unspecified: Secondary | ICD-10-CM

## 2015-11-10 DIAGNOSIS — M150 Primary generalized (osteo)arthritis: Secondary | ICD-10-CM

## 2015-11-10 DIAGNOSIS — G600 Hereditary motor and sensory neuropathy: Secondary | ICD-10-CM

## 2015-11-10 DIAGNOSIS — R972 Elevated prostate specific antigen [PSA]: Secondary | ICD-10-CM

## 2015-11-10 DIAGNOSIS — K5730 Diverticulosis of large intestine without perforation or abscess without bleeding: Secondary | ICD-10-CM

## 2015-11-10 MED ORDER — AZITHROMYCIN 250 MG PO TABS: ORAL_TABLET | ORAL | 0 refills | Status: DC
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
## 2015-11-10 NOTE — Patient Instructions (Addendum)
Today we updated your med list in our EPIC system...    Continue your current medications the same...  Let's get on track w/ our Low fat for your triglycerides), low sodium (for your BP), low carb (for your blood sugar) diet (ie- don't eat anything!) & work on weight reduction!!!  We will make a referral to a new DME company w/ a storefront here in [place] for a new CPAP machine...  We will arrange for an ASAP appt w/ DrEskridge for you elev PSA...   We called in a ZPak for your URI, and remember the OTC MucinexDM for as needed use...  Call for any questions or if we can be of service in any way...  Let's plan a follow up visit in 30yr, sooner if needed for problems.Marland KitchenMarland Kitchen

## 2015-11-13 DIAGNOSIS — N5201 Erectile dysfunction due to arterial insufficiency: Secondary | ICD-10-CM | POA: Diagnosis not present

## 2015-11-13 DIAGNOSIS — R350 Frequency of micturition: Secondary | ICD-10-CM | POA: Diagnosis not present

## 2015-11-13 DIAGNOSIS — N401 Enlarged prostate with lower urinary tract symptoms: Secondary | ICD-10-CM | POA: Diagnosis not present

## 2015-11-13 DIAGNOSIS — R972 Elevated prostate specific antigen [PSA]: Secondary | ICD-10-CM | POA: Diagnosis not present

## 2015-12-01 DIAGNOSIS — R3914 Feeling of incomplete bladder emptying: Secondary | ICD-10-CM | POA: Diagnosis not present

## 2015-12-01 DIAGNOSIS — N3000 Acute cystitis without hematuria: Secondary | ICD-10-CM | POA: Diagnosis not present

## 2015-12-03 ENCOUNTER — Other Ambulatory Visit: Payer: Self-pay | Admitting: Pulmonary Disease

## 2015-12-04 DIAGNOSIS — N401 Enlarged prostate with lower urinary tract symptoms: Secondary | ICD-10-CM | POA: Diagnosis not present

## 2015-12-04 DIAGNOSIS — R300 Dysuria: Secondary | ICD-10-CM | POA: Diagnosis not present

## 2015-12-04 DIAGNOSIS — R3914 Feeling of incomplete bladder emptying: Secondary | ICD-10-CM | POA: Diagnosis not present

## 2015-12-12 DIAGNOSIS — Z23 Encounter for immunization: Secondary | ICD-10-CM | POA: Diagnosis not present

## 2016-03-02 DIAGNOSIS — R972 Elevated prostate specific antigen [PSA]: Secondary | ICD-10-CM | POA: Diagnosis not present

## 2016-03-04 ENCOUNTER — Encounter: Payer: Self-pay | Admitting: Internal Medicine

## 2016-03-10 DIAGNOSIS — R3914 Feeling of incomplete bladder emptying: Secondary | ICD-10-CM | POA: Diagnosis not present

## 2016-03-10 DIAGNOSIS — N401 Enlarged prostate with lower urinary tract symptoms: Secondary | ICD-10-CM | POA: Diagnosis not present

## 2016-03-10 DIAGNOSIS — R972 Elevated prostate specific antigen [PSA]: Secondary | ICD-10-CM | POA: Diagnosis not present

## 2016-03-24 DIAGNOSIS — R3914 Feeling of incomplete bladder emptying: Secondary | ICD-10-CM | POA: Diagnosis not present

## 2016-04-14 DIAGNOSIS — L821 Other seborrheic keratosis: Secondary | ICD-10-CM | POA: Diagnosis not present

## 2016-04-14 DIAGNOSIS — D1801 Hemangioma of skin and subcutaneous tissue: Secondary | ICD-10-CM | POA: Diagnosis not present

## 2016-04-14 DIAGNOSIS — D485 Neoplasm of uncertain behavior of skin: Secondary | ICD-10-CM | POA: Diagnosis not present

## 2016-04-14 DIAGNOSIS — L570 Actinic keratosis: Secondary | ICD-10-CM | POA: Diagnosis not present

## 2016-04-14 DIAGNOSIS — L820 Inflamed seborrheic keratosis: Secondary | ICD-10-CM | POA: Diagnosis not present

## 2016-09-08 DIAGNOSIS — R972 Elevated prostate specific antigen [PSA]: Secondary | ICD-10-CM | POA: Diagnosis not present

## 2016-09-15 DIAGNOSIS — N401 Enlarged prostate with lower urinary tract symptoms: Secondary | ICD-10-CM | POA: Diagnosis not present

## 2016-09-15 DIAGNOSIS — R3914 Feeling of incomplete bladder emptying: Secondary | ICD-10-CM | POA: Diagnosis not present

## 2016-09-15 DIAGNOSIS — R972 Elevated prostate specific antigen [PSA]: Secondary | ICD-10-CM | POA: Diagnosis not present

## 2016-10-12 DIAGNOSIS — L570 Actinic keratosis: Secondary | ICD-10-CM | POA: Diagnosis not present

## 2016-10-12 DIAGNOSIS — L821 Other seborrheic keratosis: Secondary | ICD-10-CM | POA: Diagnosis not present

## 2016-10-12 DIAGNOSIS — D1801 Hemangioma of skin and subcutaneous tissue: Secondary | ICD-10-CM | POA: Diagnosis not present

## 2016-10-12 DIAGNOSIS — Z85828 Personal history of other malignant neoplasm of skin: Secondary | ICD-10-CM | POA: Diagnosis not present

## 2016-11-26 DIAGNOSIS — H04203 Unspecified epiphora, bilateral lacrimal glands: Secondary | ICD-10-CM | POA: Diagnosis not present

## 2016-11-26 DIAGNOSIS — H52223 Regular astigmatism, bilateral: Secondary | ICD-10-CM | POA: Diagnosis not present

## 2016-11-26 DIAGNOSIS — H524 Presbyopia: Secondary | ICD-10-CM | POA: Diagnosis not present

## 2016-11-26 DIAGNOSIS — H25813 Combined forms of age-related cataract, bilateral: Secondary | ICD-10-CM | POA: Diagnosis not present

## 2016-11-26 DIAGNOSIS — H5203 Hypermetropia, bilateral: Secondary | ICD-10-CM | POA: Diagnosis not present

## 2016-12-01 ENCOUNTER — Other Ambulatory Visit: Payer: Self-pay | Admitting: Pulmonary Disease

## 2016-12-20 DIAGNOSIS — Z23 Encounter for immunization: Secondary | ICD-10-CM | POA: Diagnosis not present

## 2016-12-27 DIAGNOSIS — L255 Unspecified contact dermatitis due to plants, except food: Secondary | ICD-10-CM | POA: Diagnosis not present

## 2016-12-27 DIAGNOSIS — R609 Edema, unspecified: Secondary | ICD-10-CM | POA: Diagnosis not present

## 2017-04-07 DIAGNOSIS — D1801 Hemangioma of skin and subcutaneous tissue: Secondary | ICD-10-CM | POA: Diagnosis not present

## 2017-04-07 DIAGNOSIS — L570 Actinic keratosis: Secondary | ICD-10-CM | POA: Diagnosis not present

## 2017-04-07 DIAGNOSIS — L821 Other seborrheic keratosis: Secondary | ICD-10-CM | POA: Diagnosis not present

## 2017-04-07 DIAGNOSIS — Z85828 Personal history of other malignant neoplasm of skin: Secondary | ICD-10-CM | POA: Diagnosis not present

## 2017-07-20 DIAGNOSIS — M1712 Unilateral primary osteoarthritis, left knee: Secondary | ICD-10-CM | POA: Diagnosis not present

## 2017-08-16 ENCOUNTER — Ambulatory Visit (INDEPENDENT_AMBULATORY_CARE_PROVIDER_SITE_OTHER)
Admission: RE | Admit: 2017-08-16 | Discharge: 2017-08-16 | Disposition: A | Discharge status: Home / Self Care | Payer: Medicare Other | Source: Ambulatory Visit | Attending: Pulmonary Disease | Admitting: Pulmonary Disease

## 2017-08-16 ENCOUNTER — Ambulatory Visit (INDEPENDENT_AMBULATORY_CARE_PROVIDER_SITE_OTHER): Payer: Medicare Other | Admitting: Pulmonary Disease

## 2017-08-16 ENCOUNTER — Encounter: Payer: Self-pay | Admitting: Pulmonary Disease

## 2017-08-16 ENCOUNTER — Other Ambulatory Visit (INDEPENDENT_AMBULATORY_CARE_PROVIDER_SITE_OTHER): Payer: Self-pay

## 2017-08-16 VITALS — BP 142/70 | HR 59 | Temp 97.9°F | Ht 73.0 in | Wt 283.4 lb

## 2017-08-16 DIAGNOSIS — E782 Mixed hyperlipidemia: Secondary | ICD-10-CM

## 2017-08-16 DIAGNOSIS — F411 Generalized anxiety disorder: Secondary | ICD-10-CM

## 2017-08-16 DIAGNOSIS — Z87898 Personal history of other specified conditions: Secondary | ICD-10-CM

## 2017-08-16 DIAGNOSIS — M150 Primary generalized (osteo)arthritis: Secondary | ICD-10-CM

## 2017-08-16 DIAGNOSIS — E663 Overweight: Secondary | ICD-10-CM

## 2017-08-16 DIAGNOSIS — G4733 Obstructive sleep apnea (adult) (pediatric): Secondary | ICD-10-CM | POA: Diagnosis not present

## 2017-08-16 DIAGNOSIS — K5730 Diverticulosis of large intestine without perforation or abscess without bleeding: Secondary | ICD-10-CM | POA: Diagnosis not present

## 2017-08-16 DIAGNOSIS — R7301 Impaired fasting glucose: Secondary | ICD-10-CM

## 2017-08-16 DIAGNOSIS — I10 Essential (primary) hypertension: Secondary | ICD-10-CM

## 2017-08-16 DIAGNOSIS — M159 Polyosteoarthritis, unspecified: Secondary | ICD-10-CM

## 2017-08-16 DIAGNOSIS — N401 Enlarged prostate with lower urinary tract symptoms: Secondary | ICD-10-CM

## 2017-08-16 DIAGNOSIS — N138 Other obstructive and reflux uropathy: Secondary | ICD-10-CM

## 2017-08-16 DIAGNOSIS — I2510 Atherosclerotic heart disease of native coronary artery without angina pectoris: Secondary | ICD-10-CM | POA: Diagnosis not present

## 2017-08-16 DIAGNOSIS — D126 Benign neoplasm of colon, unspecified: Secondary | ICD-10-CM

## 2017-08-16 DIAGNOSIS — R918 Other nonspecific abnormal finding of lung field: Secondary | ICD-10-CM | POA: Diagnosis not present

## 2017-08-16 DIAGNOSIS — G600 Hereditary motor and sensory neuropathy: Secondary | ICD-10-CM | POA: Diagnosis not present

## 2017-08-16 LAB — COMPREHENSIVE METABOLIC PANEL
ALT: 17 U/L (ref 0–53)
AST: 17 U/L (ref 0–37)
Albumin: 4.4 g/dL (ref 3.5–5.2)
Alkaline Phosphatase: 59 U/L (ref 39–117)
BUN: 17 mg/dL (ref 6–23)
CO2: 28 meq/L (ref 19–32)
Calcium: 9.8 mg/dL (ref 8.4–10.5)
Chloride: 104 mEq/L (ref 96–112)
Creatinine, Ser: 0.98 mg/dL (ref 0.40–1.50)
GFR: 79.79 mL/min (ref >60.00)
GLUCOSE: 117 mg/dL — AB (ref 70–99)
POTASSIUM: 4.4 meq/L (ref 3.5–5.1)
SODIUM: 141 meq/L (ref 135–145)
Total Bilirubin: 0.4 mg/dL (ref 0.2–1.2)
Total Protein: 7.5 g/dL (ref 6.0–8.3)

## 2017-08-16 LAB — CBC WITH DIFFERENTIAL/PLATELET
BASOS PCT: 1 % (ref 0.0–3.0)
Basophils Absolute: 0.1 10*3/uL (ref 0.0–0.1)
EOS PCT: 2.9 % (ref 0.0–5.0)
Eosinophils Absolute: 0.2 10*3/uL (ref 0.0–0.7)
HEMATOCRIT: 43.5 % (ref 39.0–52.0)
HEMOGLOBIN: 15 g/dL (ref 13.0–17.0)
Lymphocytes Relative: 23.6 % (ref 12.0–46.0)
Lymphs Abs: 1.9 10*3/uL (ref 0.7–4.0)
MCHC: 34.4 g/dL (ref 30.0–36.0)
MCV: 90 fl (ref 78.0–100.0)
MONO ABS: 0.5 10*3/uL (ref 0.1–1.0)
Monocytes Relative: 6.6 % (ref 3.0–12.0)
Neutro Abs: 5.2 10*3/uL (ref 1.4–7.7)
Neutrophils Relative %: 65.9 % (ref 43.0–77.0)
Platelets: 266 10*3/uL (ref 150.0–400.0)
RBC: 4.84 Mil/uL (ref 4.22–5.81)
RDW: 13.4 % (ref 11.5–15.5)
WBC: 7.9 10*3/uL (ref 4.0–10.5)

## 2017-08-16 LAB — LIPID PANEL
CHOL/HDL RATIO: 4
CHOLESTEROL: 167 mg/dL (ref 0–200)
HDL: 40 mg/dL (ref >39.00)
LDL CALC: 102 mg/dL — AB (ref 0–99)
NONHDL: 126.79
Triglycerides: 124 mg/dL (ref 0.0–149.0)
VLDL: 24.8 mg/dL (ref 0.0–40.0)

## 2017-08-16 LAB — HEMOGLOBIN A1C: Hgb A1c MFr Bld: 6.4 % (ref 4.6–6.5)

## 2017-08-16 LAB — TSH: TSH: 0.89 u[IU]/mL (ref 0.35–4.50)

## 2017-08-16 MED ORDER — BACLOFEN 10 MG PO TABS: 10.0000 mg | ORAL_TABLET | Freq: Three times a day (TID) | ORAL | 0 refills | Status: DC | PRN
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
## 2017-08-16 NOTE — Patient Instructions (Signed)
Today we updated your med list in our EPIC system...    Continue your current medications the same...  We added a musc relaxer- BACLOFEN (Lioresal) 10mg - take one tab up to 3 times daily as needed...  Today we did your follow up FASTING blood work & a CXR...    We will contact you w/ the results when available...   Remember you need to schedule a follow up colonoscopy at your convenience...  Let's get on track w/ our diet 7 exercise program.    Work on weight reduction...  Call for any questions or if we can be of service in any way.Marland KitchenMarland Kitchen

## 2017-08-25 ENCOUNTER — Encounter: Payer: Self-pay | Admitting: Internal Medicine

## 2017-09-08 ENCOUNTER — Ambulatory Visit (AMBULATORY_SURGERY_CENTER): Payer: Self-pay

## 2017-09-08 ENCOUNTER — Telehealth: Payer: Self-pay | Admitting: Gastroenterology

## 2017-09-08 VITALS — Ht 73.0 in | Wt 289.2 lb

## 2017-09-08 DIAGNOSIS — Z86010 Personal history of colonic polyps: Secondary | ICD-10-CM

## 2017-09-08 MED ORDER — PEG 3350-KCL-NA BICARB-NACL 420 G PO SOLR: 4000.0000 mL | Freq: Once | ORAL | 0 refills | Status: AC

## 2017-09-08 NOTE — Progress Notes (Signed)
Per pt, no allergies to soy or egg products.Pt not taking any weight loss meds or using  O2 at home.  Pt refused emmi video.  While verifying the colonoscopy appointment with the pt,he got upset he was scheduled with Dr Hilarie Fredrickson and not Dr Ardis Hughs. Pt requested Dr Ardis Hughs with a morning appointment. Informed pt I would change his appt now, but he did not want to wait any longer!  Left message with scheduling on the 3rd floor to call the pt with a morning appt with Dr Ardis Hughs, if possible.

## 2017-09-08 NOTE — Telephone Encounter (Signed)
Absolutely, happy to help him anytime!

## 2017-09-12 ENCOUNTER — Other Ambulatory Visit: Payer: Self-pay | Admitting: Pulmonary Disease

## 2017-09-12 NOTE — Telephone Encounter (Signed)
Spoke to Ralph Garrett but will stay with Dr Hilarie Fredrickson since that the soonest appointment available at this time. Wants to thank Dr Ardis Hughs for agreeing to see him but he want sto have procedure before August.

## 2017-09-26 ENCOUNTER — Ambulatory Visit (AMBULATORY_SURGERY_CENTER): Payer: Medicare Other | Admitting: Internal Medicine

## 2017-09-26 ENCOUNTER — Other Ambulatory Visit: Payer: Self-pay

## 2017-09-26 ENCOUNTER — Encounter: Payer: Self-pay | Admitting: Internal Medicine

## 2017-09-26 VITALS — BP 147/64 | HR 49 | Temp 98.6°F | Resp 10 | Ht 73.0 in | Wt 283.0 lb

## 2017-09-26 DIAGNOSIS — D124 Benign neoplasm of descending colon: Secondary | ICD-10-CM

## 2017-09-26 DIAGNOSIS — D128 Benign neoplasm of rectum: Secondary | ICD-10-CM | POA: Diagnosis not present

## 2017-09-26 DIAGNOSIS — Z86010 Personal history of colonic polyps: Secondary | ICD-10-CM | POA: Diagnosis not present

## 2017-09-26 DIAGNOSIS — D123 Benign neoplasm of transverse colon: Secondary | ICD-10-CM | POA: Diagnosis not present

## 2017-09-26 DIAGNOSIS — D122 Benign neoplasm of ascending colon: Secondary | ICD-10-CM

## 2017-09-26 DIAGNOSIS — D129 Benign neoplasm of anus and anal canal: Secondary | ICD-10-CM

## 2017-09-26 MED ORDER — SODIUM CHLORIDE 0.9 % IV SOLN: 500.0000 mL | Freq: Once | INTRAVENOUS | Status: DC

## 2017-09-26 NOTE — Progress Notes (Signed)
Called to room to assist during endoscopic procedure.  Patient ID and intended procedure confirmed with present staff. Received instructions for my participation in the procedure from the performing physician.  

## 2017-09-26 NOTE — Progress Notes (Signed)
Pt's states no medical or surgical changes since previsit or office visit. 

## 2017-09-26 NOTE — Patient Instructions (Signed)
Please read handouts on polyps, hemorrhoids, and diverticulosis.    YOU HAD AN ENDOSCOPIC PROCEDURE TODAY AT [facility]:   Refer to the procedure report that was given to you for any specific questions about what was found during the examination.  If the procedure report does not answer your questions, please call your gastroenterologist to clarify.  If you requested that your care partner not be given the details of your procedure findings, then the procedure report has been included in a sealed envelope for you to review at your convenience later.  YOU SHOULD EXPECT: Some feelings of bloating in the abdomen. Passage of more gas than usual.  Walking can help get rid of the air that was put into your GI tract during the procedure and reduce the bloating. If you had a lower endoscopy (such as a colonoscopy or flexible sigmoidoscopy) you may notice spotting of blood in your stool or on the toilet paper. If you underwent a bowel prep for your procedure, you may not have a normal bowel movement for a few days.  Please Note:  You might notice some irritation and congestion in your nose or some drainage.  This is from the oxygen used during your procedure.  There is no need for concern and it should clear up in a day or so.  SYMPTOMS TO REPORT IMMEDIATELY:   Following lower endoscopy (colonoscopy or flexible sigmoidoscopy):  Excessive amounts of blood in the stool  Significant tenderness or worsening of abdominal pains  Swelling of the abdomen that is new, acute  Fever of 100F or higher    For urgent or emergent issues, a gastroenterologist can be reached at any hour by calling [phone].   DIET:  We do recommend a small meal at first, but then you may proceed to your regular diet.  Drink plenty of fluids but you should avoid alcoholic beverages for 24 hours.  ACTIVITY:  You should plan to take it easy for the rest of today and you should NOT DRIVE or use heavy machinery  until tomorrow (because of the sedation medicines used during the test).    FOLLOW UP: Our staff will call the number listed on your records the next business day following your procedure to check on you and address any questions or concerns that you may have regarding the information given to you following your procedure. If we do not reach you, we will leave a message.  However, if you are feeling well and you are not experiencing any problems, there is no need to return our call.  We will assume that you have returned to your regular daily activities without incident.  If any biopsies were taken you will be contacted by phone or by letter within the next 1-3 weeks.  Please call us at [phone] if you have not heard about the biopsies in 3 weeks.    SIGNATURES/CONFIDENTIALITY: You and/or your care partner have signed paperwork which will be entered into your electronic medical record.  These signatures attest to the fact that that the information above on your After Visit Summary has been reviewed and is understood.  Full responsibility of the confidentiality of this discharge information lies with you and/or your care-partner.

## 2017-09-26 NOTE — Progress Notes (Signed)
A/ox3 pleased with MAC, report to RN 

## 2017-09-26 NOTE — Op Note (Signed)
[name] Procedure Date: 09/26/2017 2:49 PM MRN: [id number] Endoscopist: Jerene Bears , MD Age: 72 Referring MD:  Date of Birth: May 18, 1945 Gender: Male Account #: [id number] Procedure:                Colonoscopy Indications:              Surveillance: Personal history of adenomatous                            polyps on last colonoscopy 5 years ago Medicines:                Monitored Anesthesia Care Procedure:                Pre-Anesthesia Assessment:                           - Prior to the procedure, a History and Physical                            was performed, and patient medications and                            allergies were reviewed. The patient's tolerance of                            previous anesthesia was also reviewed. The risks                            and benefits of the procedure and the sedation                            options and risks were discussed with the patient.                            All questions were answered, and informed consent                            was obtained. Prior Anticoagulants: The patient has                            taken no previous anticoagulant or antiplatelet                            agents. ASA Grade Assessment: II - A patient with                            mild systemic disease. After reviewing the risks                            and benefits, the patient was deemed in                            satisfactory condition to undergo the procedure.  After obtaining informed consent, the colonoscope                            was passed under direct vision. Throughout the                            procedure, the patient's blood pressure, pulse, and                            oxygen saturations were monitored continuously. The                            Colonoscope was introduced through the anus and                            advanced to the cecum,  identified by appendiceal                            orifice and ileocecal valve. The colonoscopy was                            performed without difficulty. The patient tolerated                            the procedure well. The quality of the bowel                            preparation was good. The ileocecal valve,                            appendiceal orifice, and rectum were photographed. Scope In: 2:59:26 PM Scope Out: 3:40:29 PM Scope Withdrawal Time: 0 hours 32 minutes 24 seconds  Total Procedure Duration: 0 hours 41 minutes 3 seconds  Findings:                 The digital rectal exam was normal.                           Five sessile polyps were found in the ascending                            colon. The polyps were 3 to 6 mm in size. These                            polyps were removed with a cold snare. Resection                            and retrieval were complete.                           Four sessile polyps were found in the transverse                            colon. The polyps were 4 to  8 mm in size. These                            polyps were removed with a cold snare. Resection                            and retrieval were complete.                           A 3 mm polyp was found in the transverse colon. The                            polyp was sessile. The polyp was removed with a                            cold biopsy forceps. Resection and retrieval were                            complete.                           Two sessile polyps were found in the descending                            colon. The polyps were 4 to 5 mm in size. These                            polyps were removed with a cold snare. Resection                            and retrieval were complete.                           A 5 mm polyp was found in the rectum. The polyp was                            sessile. The polyp was removed with a cold snare.                            Resection  and retrieval were complete.                           Multiple small and large-mouthed diverticula were                            found in the sigmoid colon and distal descending                            colon.                           Internal hemorrhoids were found during  retroflexion. The hemorrhoids were small. Complications:            No immediate complications. Estimated Blood Loss:     Estimated blood loss was minimal. Impression:               - Five 3 to 6 mm polyps in the ascending colon,                            removed with a cold snare. Resected and retrieved.                           - Four 4 to 8 mm polyps in the transverse colon,                            removed with a cold snare. Resected and retrieved.                           - One 3 mm polyp in the transverse colon, removed                            with a cold biopsy forceps. Resected and retrieved.                           - Two 4 to 5 mm polyps in the descending colon,                            removed with a cold snare. Resected and retrieved.                           - One 5 mm polyp in the rectum, removed with a cold                            snare. Resected and retrieved.                           - Diverticulosis in the sigmoid colon and in the                            distal descending colon.                           - Small internal hemorrhoids. Recommendation:           - Patient has a contact number available for                            emergencies. The signs and symptoms of potential                            delayed complications were discussed with the                            patient. Return to normal activities tomorrow.  Written discharge instructions were provided to the                            patient.                           - Resume previous diet.                           - Continue present medications.                            - Await pathology results.                           - Repeat colonoscopy is recommended for                            surveillance of multiple polyps. The colonoscopy                            date will be determined after pathology results                            from today's exam become available for review. Jerene Bears, MD 09/26/2017 3:49:17 PM This report has been signed electronically.

## 2017-09-27 ENCOUNTER — Telehealth: Payer: Self-pay | Admitting: *Deleted

## 2017-09-27 NOTE — Telephone Encounter (Signed)
  Follow up Call-  Call back number 09/26/2017  Post procedure Call Back phone  # [phone] cell  Permission to leave phone message Yes  Some recent data might be hidden     Patient questions:  Do you have a fever, pain , or abdominal swelling? No. Pain Score  0 *  Have you tolerated food without any problems? Yes.    Have you been able to return to your normal activities? Yes.    Do you have any questions about your discharge instructions: Diet   No. Medications  No. Follow up visit  No.  Do you have questions or concerns about your Care? No.  Actions: * If pain score is 4 or above: No action needed, pain <4.

## 2017-10-03 ENCOUNTER — Encounter: Payer: Self-pay | Admitting: Internal Medicine

## 2017-11-27 ENCOUNTER — Other Ambulatory Visit: Payer: Self-pay | Admitting: Pulmonary Disease

## 2017-12-20 DIAGNOSIS — Z23 Encounter for immunization: Secondary | ICD-10-CM | POA: Diagnosis not present

## 2017-12-31 DIAGNOSIS — S5012XA Contusion of left forearm, initial encounter: Secondary | ICD-10-CM | POA: Diagnosis not present

## 2018-01-04 DIAGNOSIS — R972 Elevated prostate specific antigen [PSA]: Secondary | ICD-10-CM | POA: Diagnosis not present

## 2018-01-04 DIAGNOSIS — N401 Enlarged prostate with lower urinary tract symptoms: Secondary | ICD-10-CM | POA: Diagnosis not present

## 2018-01-04 DIAGNOSIS — R350 Frequency of micturition: Secondary | ICD-10-CM | POA: Diagnosis not present

## 2018-02-08 ENCOUNTER — Telehealth: Payer: Self-pay | Admitting: Pulmonary Disease

## 2018-02-08 NOTE — Telephone Encounter (Signed)
SN aware and is going to call Patient later today.  Nothing further.

## 2018-02-08 NOTE — Telephone Encounter (Signed)
Called and spoke to pt. Pt is requesting recommendations for new provider starting Dec.  SN please advise. Thanks  Current Outpatient Medications on File Prior to Visit  Medication Sig Dispense Refill  . aspirin 81 MG tablet Take 81 mg by mouth daily.     . baclofen (LIORESAL) 10 MG tablet TAKE 1 TABLET(10 MG) BY MOUTH THREE TIMES DAILY AS NEEDED FOR MUSCLE SPASMS 90 tablet 0  . DiphenhydrAMINE HCl (BENADRYL ALLERGY PO) Take 1 tablet by mouth as needed.     . Dutasteride (AVODART PO) Take by mouth daily at 6 (six) AM.     . lisinopril (PRINIVIL,ZESTRIL) 20 MG tablet TAKE 1 TABLET BY MOUTH ONCE DAILY 90 tablet 0   Current Facility-Administered Medications on File Prior to Visit  Medication Dose Route Frequency Provider Last Rate Last Dose  . 0.9 %  sodium chloride infusion  500 mL Intravenous Once Pyrtle, Lajuan Lines, MD        Allergies  Allergen Reactions  . Penicillins     REACTION: rash as a child

## 2018-02-10 ENCOUNTER — Telehealth: Payer: Self-pay | Admitting: Pulmonary Disease

## 2018-02-10 NOTE — Telephone Encounter (Signed)
Called and spoke with Ralph Garrett at [facility].   She stated that no letter is needed, because it is a transfer of care, because Dr. Lenna Gilford is retiring. She stated if any Patient calls, they just need to let them know that Dr. Lenna Gilford is retiring and if they are giving any problems, let them know that Ralph Garrett knows and it is ok. Called and left Patient a detailed message.  Nothing further at this time.

## 2018-02-24 ENCOUNTER — Other Ambulatory Visit: Payer: Self-pay | Admitting: Pulmonary Disease

## 2018-03-09 ENCOUNTER — Encounter: Payer: Self-pay | Admitting: Podiatry

## 2018-03-09 ENCOUNTER — Ambulatory Visit (INDEPENDENT_AMBULATORY_CARE_PROVIDER_SITE_OTHER): Payer: Medicare Other | Admitting: Podiatry

## 2018-03-09 VITALS — BP 135/69

## 2018-03-09 DIAGNOSIS — L84 Corns and callosities: Secondary | ICD-10-CM | POA: Diagnosis not present

## 2018-03-09 DIAGNOSIS — M79672 Pain in left foot: Secondary | ICD-10-CM | POA: Diagnosis not present

## 2018-03-09 DIAGNOSIS — G600 Hereditary motor and sensory neuropathy: Secondary | ICD-10-CM

## 2018-03-09 DIAGNOSIS — M25571 Pain in right ankle and joints of right foot: Secondary | ICD-10-CM | POA: Diagnosis not present

## 2018-03-09 NOTE — Patient Instructions (Signed)
Corns and Calluses Corns are small areas of thickened skin that occur on the top, sides, or tip of a toe. They contain a cone-shaped core with a point that can press on a nerve below. This causes pain. Calluses are areas of thickened skin that can occur anywhere on the body including hands, fingers, palms, soles of the feet, and heels.Calluses are usually larger than corns. What are the causes? Corns and calluses are caused by rubbing (friction) or pressure, such as from shoes that are too tight or do not fit properly. What increases the risk? Corns are more likely to develop in people who have toe deformities, such as hammer toes. Since calluses can occur with friction to any area of the skin, calluses are more likely to develop in people who:  Work with their hands.  Wear shoes that fit poorly, shoes that are too tight, or shoes that are high-heeled.  Have toes deformities.  What are the signs or symptoms? Symptoms of a corn or callus include:  A hard growth on the skin.  Pain or tenderness under the skin.  Redness and swelling.  Increased discomfort while wearing tight-fitting shoes.  How is this diagnosed? Corns and calluses may be diagnosed with a medical history and physical exam. How is this treated? Corns and calluses may be treated with:  Removing the cause of the friction or pressure. This may include: ? Changing your shoes. ? Wearing shoe inserts (orthotics) or other protective layers in your shoes, such as a corn pad. ? Wearing gloves.  Medicines to help soften skin in the hardened, thickened areas.  Reducing the size of the corn or callus by removing the dead layers of skin.  Antibiotic medicines to treat infection.  Surgery, if a toe deformity is the cause.  Follow these instructions at home:  Take medicines only as directed by your health care provider.  If you were prescribed an antibiotic, finish all of it even if you start to feel better.  Wear  shoes that fit well. Avoid wearing high-heeled shoes and shoes that are too tight or too loose.  Wear any padding, protective layers, gloves, or orthotics as directed by your health care provider.  Soak your hands or feet and then use a file or pumice stone to soften your corn or callus. Do this as directed by your health care provider.  Check your corn or callus every day for signs of infection. Watch for: ? Redness, swelling, or pain. ? Fluid, blood, or pus. Contact a health care provider if:  Your symptoms do not improve with treatment.  You have increased redness, swelling, or pain at the site of your corn or callus.  You have fluid, blood, or pus coming from your corn or callus.  You have new symptoms. This information is not intended to replace advice given to you by your health care provider. Make sure you discuss any questions you have with your health care provider. Document Released: 12/27/2003 Document Revised: 10/10/2015 Document Reviewed: 03/18/2014 Elsevier Interactive Patient Education  2018 [employer] Inc[employer]  

## 2018-03-09 NOTE — Progress Notes (Signed)
Subjective: Ralph Garrett presents to clinic to clinic today with a long-standing history of Charcot-Marie-Tooth disease.  He is here today with chief complaint of painful calluses on the bottom of both feet.  He relates pain the patient at the clinic some years ago.    He relates now he is beginning to experience more falls and has had to adjust his life in terms of some of the chores that he can do around his home. Past Medical History:  Diagnosis Date  . BPH (benign prostatic hyperplasia)   . Coronary artery disease   . Diverticulosis of colon   . GERD (gastroesophageal reflux disease)   . History of bacterial meningitis 1987  . Hx of colonic polyps   . Hypercholesteremia    no per pt  . Hypertension   . Increased prostate specific antigen (PSA) velocity   . Meniscus tear    left knee  . Obesity   . Peroneal muscular atrophy   . Sleep apnea    wears cpap   Patient Active Problem List   Diagnosis Date Noted  . History of elevated PSA 08/16/2017  . Osteoarthritis of left knee 07/20/2017  . Shingles 07/25/2014  . Acute medial meniscal tear 04/16/2014  . Hyperlipidemia 12/25/2012  . Overweight 12/25/2012  . Left hip pain 11/16/2012  . Left knee pain 08/07/2012  . Left wrist pain 08/07/2012  . Rash 11/26/2011  . Charcot-Marie-Tooth disease 06/03/2011  . Special screening for malignant neoplasms, colon 04/12/2011  . Personal history of colonic polyps 04/12/2011  . BACTERIAL MENINGITIS 04/30/2008  . COLONIC POLYPS 06/15/2007  . Obstructive sleep apnea 06/15/2007  . OTITIS MEDIA 06/15/2007  . Coronary atherosclerosis 06/15/2007  . Diverticulosis of large intestine 06/15/2007  . Essential hypertension 06/14/2007  . GERD 06/14/2007  . Osteoarthritis 06/14/2007  . Benign prostatic hyperplasia with urinary obstruction 06/14/2007   Past Surgical History:  Procedure Laterality Date  . CHOLECYSTECTOMY    . COLONOSCOPY  02/2004   Dr.Streck  . KNEE ARTHROSCOPY Left 04/17/2014    Procedure: LEFT KNEE ARTHROSCOPY WITH MEDIAL MENISCAL DEBRIDEMENT  ;  Surgeon: Gearlean Alf, MD;  Location: [facility];  Service: Orthopedics;  Laterality: Left;  . POLYPECTOMY    . TRIGGER FINGER RELEASE  2009   right hand--Dr Burney Gauze  . UPPER GASTROINTESTINAL ENDOSCOPY     Current Outpatient Medications:  .  aspirin 81 MG tablet, Take 81 mg by mouth daily. , Disp: , Rfl:  .  baclofen (LIORESAL) 10 MG tablet, TAKE 1 TABLET(10 MG) BY MOUTH THREE TIMES DAILY AS NEEDED FOR MUSCLE SPASMS, Disp: 90 tablet, Rfl: 0 .  DiphenhydrAMINE HCl (BENADRYL ALLERGY PO), Take 1 tablet by mouth as needed. , Disp: , Rfl:  .  doxycycline (VIBRAMYCIN) 100 MG capsule, doxycycline hyclate 100 mg capsule  Take 1 capsule twice a day by oral route for 10 days., Disp: , Rfl:  .  Dutasteride (AVODART PO), Take by mouth daily at 6 (six) AM. , Disp: , Rfl:  .  FLUAD 0.5 ML SUSY, ADM 0.5ML IM UTD, Disp: , Rfl: 0 .  lisinopril (PRINIVIL,ZESTRIL) 20 MG tablet, TAKE 1 TABLET BY MOUTH ONCE DAILY, Disp: 90 tablet, Rfl: 0  Current Facility-Administered Medications:  .  0.9 %  sodium chloride infusion, 500 mL, Intravenous, Once, Pyrtle, Lajuan Lines, MD  Allergies  Allergen Reactions  . Penicillins     REACTION: rash as a child   Social History   Tobacco Use  Smoking Status Former Smoker  .  Packs/day: 1.00  . Years: 30.00  . Pack years: 30.00  . Types: Cigarettes  . Last attempt to quit: 04/06/1999  . Years since quitting: 18.9  Smokeless Tobacco Never Used   Family History  Problem Relation Age of Onset  . COPD Mother   . Charcot-Marie-Tooth disease Mother   . Heart disease Father   . Hypertension Father   . Prostate cancer Father        dx in his 23's  . Cancer - Other Brother   . Lung cancer Brother   . Charcot-Marie-Tooth disease Maternal Grandmother   . Colon cancer Neg Hx   . Esophageal cancer Neg Hx   . Stomach cancer Neg Hx   . Rectal cancer Neg Hx   . Liver cancer Neg Hx   . Pancreatic cancer Neg Hx     Objective: Vitals:   03/09/18 0931  BP: 135/69   Vascular Examination: Capillary refill time <3 seconds x 10 digits Dorsalis pedis and Posterior tibial pulses present b/l Digital hair present x10 digits Skin temperature gradient within normal limits bilaterally  Dermatological Examination: Skin with normal turgor, texture and tone b/l  Toenails 1-5 b/l well-maintained   Hyperkeratotic lesions noted submetatarsal heads 1, 5 bilaterally.  The left foot callus is noted to be more punctate and consistent with the shape of the tibial sesamoid.  This is his more symptomatic area.  There is no erythema, no edema, no drainage noted of either lesion.  Musculoskeletal: Muscle strength 5/5 to all LE muscle groups  Rigid cavus foot deformity consistent with Charcot-Marie-Tooth disease  Neurological: Sensation diminished with 10 gram monofilament bilaterally Vibratory sensation intact bilaterally  Assessment: 1. Painful calluses submetatarsal heads 1, 5 bilaterally 2. Neuropathy associated with Charcot-Marie-Tooth disease  Plan: 1. Discussed diagnosis and treatment options available.  Patient desired and agreed with today's recommendation of debridement. 2. Calluses pared submetatarsal heads 1, 5 bilaterally without incident. 3. Discussed falls and we discussed the [employer].  The patient was given a brochure on [employer] we will discuss again on his next visit 4. Patient to continue soft, accommodative, supportive shoe gear. 5. Patient to report any pedal injuries to medical professional  6. Per patient request he will follow-up when he begins to have pain again.

## 2018-04-06 DIAGNOSIS — L603 Nail dystrophy: Secondary | ICD-10-CM | POA: Diagnosis not present

## 2018-04-06 DIAGNOSIS — L570 Actinic keratosis: Secondary | ICD-10-CM | POA: Diagnosis not present

## 2018-04-06 DIAGNOSIS — L821 Other seborrheic keratosis: Secondary | ICD-10-CM | POA: Diagnosis not present

## 2018-04-06 DIAGNOSIS — D485 Neoplasm of uncertain behavior of skin: Secondary | ICD-10-CM | POA: Diagnosis not present

## 2018-04-11 ENCOUNTER — Ambulatory Visit (INDEPENDENT_AMBULATORY_CARE_PROVIDER_SITE_OTHER): Payer: Medicare Other | Admitting: Internal Medicine

## 2018-04-11 ENCOUNTER — Encounter: Payer: Self-pay | Admitting: Internal Medicine

## 2018-04-11 VITALS — BP 130/80 | HR 95 | Temp 98.4°F | Ht 73.0 in | Wt 288.4 lb

## 2018-04-11 DIAGNOSIS — Z23 Encounter for immunization: Secondary | ICD-10-CM

## 2018-04-11 DIAGNOSIS — I10 Essential (primary) hypertension: Secondary | ICD-10-CM | POA: Diagnosis not present

## 2018-04-11 DIAGNOSIS — N401 Enlarged prostate with lower urinary tract symptoms: Secondary | ICD-10-CM

## 2018-04-11 DIAGNOSIS — N138 Other obstructive and reflux uropathy: Secondary | ICD-10-CM

## 2018-04-11 DIAGNOSIS — E782 Mixed hyperlipidemia: Secondary | ICD-10-CM

## 2018-04-11 DIAGNOSIS — R05 Cough: Secondary | ICD-10-CM | POA: Diagnosis not present

## 2018-04-11 DIAGNOSIS — R059 Cough, unspecified: Secondary | ICD-10-CM

## 2018-04-11 DIAGNOSIS — G600 Hereditary motor and sensory neuropathy: Secondary | ICD-10-CM

## 2018-04-11 NOTE — Addendum Note (Signed)
Addended by: Westley Hummer B on: 04/11/2018 09:36 AM   Modules accepted: Orders

## 2018-04-11 NOTE — Patient Instructions (Addendum)
-  It was nice meeting you today!  -Pneumonia vaccine today first in series.  -We will plan to see you back in May for your annual physical.  Please come fasting to that visit.

## 2018-04-11 NOTE — Progress Notes (Signed)
Established Patient Office Visit     CC/Reason for Visit: Establish care, follow-up chronic medical conditions  HPI: Ralph Garrett is a 73 y.o. male who is coming in today for the above mentioned reasons.  Due for annual physical exam in May 2020.  Past Medical History is significant for: BPH with symptoms that are fairly well controlled on Avodart, hypertension on lisinopril with fair control, he states systolics usually run around 1 30-1 40 at home and diastolics between 80 and 90.  He also has a history of hyperlipidemia that tried statins in the past but had significant myalgias and this was discontinued.  He states that for the past 3 to 4 weeks he has been having a cough, he thinks this is from grandchildren that have been sick.  Due for first pneumonia vaccination today.  Past Medical/Surgical History: Past Medical History:  Diagnosis Date  . BPH (benign prostatic hyperplasia)   . Coronary artery disease   . Diverticulosis of colon   . GERD (gastroesophageal reflux disease)   . History of bacterial meningitis 1987  . Hx of colonic polyps   . Hypercholesteremia    no per pt  . Hypertension   . Increased prostate specific antigen (PSA) velocity   . Meniscus tear    left knee  . Obesity   . Peroneal muscular atrophy   . Sleep apnea    wears cpap    Past Surgical History:  Procedure Laterality Date  . CHOLECYSTECTOMY    . COLONOSCOPY  02/2004   Dr.Streck  . KNEE ARTHROSCOPY Left 04/17/2014   Procedure: LEFT KNEE ARTHROSCOPY WITH MEDIAL MENISCAL DEBRIDEMENT  ;  Surgeon: Gearlean Alf, MD;  Location: [facility];  Service: Orthopedics;  Laterality: Left;  . POLYPECTOMY    . TRIGGER FINGER RELEASE  2009   right hand--Dr Burney Gauze  . UPPER GASTROINTESTINAL ENDOSCOPY      Social History:  reports that he quit smoking about 19 years ago. His smoking use included cigarettes. He has a 30.00 pack-year smoking history. He has never used smokeless tobacco. He reports current  alcohol use. He reports that he does not use drugs.  Allergies: Allergies  Allergen Reactions  . Penicillins     REACTION: rash as a child    Family History:  Family History  Problem Relation Age of Onset  . COPD Mother   . Charcot-Marie-Tooth disease Mother   . Heart disease Father   . Hypertension Father   . Prostate cancer Father        dx in his 3's  . Cancer - Other Brother   . Lung cancer Brother   . Charcot-Marie-Tooth disease Maternal Grandmother   . Colon cancer Neg Hx   . Esophageal cancer Neg Hx   . Stomach cancer Neg Hx   . Rectal cancer Neg Hx   . Liver cancer Neg Hx   . Pancreatic cancer Neg Hx      Current Outpatient Medications:  .  aspirin 81 MG tablet, Take 81 mg by mouth daily. , Disp: , Rfl:  .  DiphenhydrAMINE HCl (BENADRYL ALLERGY PO), Take 1 tablet by mouth as needed. , Disp: , Rfl:  .  Dutasteride (AVODART PO), Take by mouth daily at 6 (six) AM. , Disp: , Rfl:  .  lisinopril (PRINIVIL,ZESTRIL) 20 MG tablet, TAKE 1 TABLET BY MOUTH ONCE DAILY, Disp: 90 tablet, Rfl: 0  Current Facility-Administered Medications:  .  0.9 %  sodium chloride infusion, 500  mL, Intravenous, Once, Pyrtle, Lajuan Lines, MD  Review of Systems:  Constitutional: Denies fever, chills, diaphoresis, appetite change and fatigue.  HEENT: Denies photophobia, eye pain, redness, hearing loss, ear pain, congestion, sore throat, rhinorrhea, sneezing, mouth sores, trouble swallowing, neck pain, neck stiffness and tinnitus.   Respiratory: Denies SOB, DOE, chest tightness,  and wheezing.   Cardiovascular: Denies chest pain, palpitations and leg swelling.  Gastrointestinal: Denies nausea, vomiting, abdominal pain, diarrhea, constipation, blood in stool and abdominal distention.  Genitourinary: Denies dysuria, urgency, frequency, hematuria, flank pain and difficulty urinating.  Endocrine: Denies: hot or cold intolerance, sweats, changes in hair or nails, polyuria, polydipsia. Musculoskeletal:  Denies myalgias, back pain, joint swelling, arthralgias and gait problem.  Skin: Denies pallor, rash and wound.  Neurological: Denies dizziness, seizures, syncope, weakness, light-headedness, numbness and headaches.  Hematological: Denies adenopathy. Easy bruising, personal or family bleeding history  Psychiatric/Behavioral: Denies suicidal ideation, mood changes, confusion, nervousness, sleep disturbance and agitation    Physical Exam: Vitals:   04/11/18 0830  BP: 130/80  Pulse: 95  Temp: 98.4 F (36.9 C)  TempSrc: Oral  SpO2: 97%  Weight: 288 lb 6.4 oz (130.8 kg)  Height: 6\' 1"  (1.854 m)    Body mass index is 38.05 kg/m.   Constitutional: NAD, calm, comfortable Eyes: PERRL, lids and conjunctivae normal ENMT: Mucous membranes are moist. Posterior pharynx clear of any exudate or lesions. Normal dentition.  Neck: normal, supple, no masses, no thyromegaly Respiratory: clear to auscultation bilaterally, no wheezing, no crackles. Normal respiratory effort. No accessory muscle use.  Cardiovascular: Regular rate and rhythm, no murmurs / rubs / gallops. No extremity edema. 2+ pedal pulses. No carotid bruits.  Abdomen: no tenderness, no masses palpated. No hepatosplenomegaly. Bowel sounds positive.  Musculoskeletal: no clubbing / cyanosis. No joint deformity upper and lower extremities. Good ROM, no contractures. Normal muscle tone.  Skin: no rashes, lesions, ulcers. No induration Neurologic: CN 2-12 grossly intact. Sensation intact, DTR normal. Strength 5/5 in all 4.  Psychiatric: Normal judgment and insight. Alert and oriented x 3. Normal mood.    Impression and Plan:  Essential hypertension -Well-controlled in office today, although he states his ambulatory measurements have been elevated. -He will continue taking lisinopril and he will bring an ambulatory monitor at next visit.  Mixed hyperlipidemia -Last LDL was 102 in May 2019. -Tried statins but had intolerance in the  past.  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  Benign prostatic hyperplasia with urinary obstruction -Symptoms fairly well controlled on Avodart, follows with urology.  Cough -No URI symptoms. -He believes the coughing is from his grandchildren who have been sick over the winter. -We have discussed ACE inhibitor induced cough. -He will let us know if the cough is persistent at which point be will likely need to discontinue lisinopril.    Patient Instructions  -It was nice meeting you today!  -Pneumonia vaccine today first in series.  -We will plan to see you back in May for your annual physical.  Please come fasting to that visit.     Lelon Frohlich, MD [facility]

## 2018-05-25 ENCOUNTER — Other Ambulatory Visit: Payer: Self-pay | Admitting: Pulmonary Disease

## 2018-06-17 ENCOUNTER — Other Ambulatory Visit: Payer: Self-pay | Admitting: Internal Medicine

## 2018-08-16 ENCOUNTER — Encounter: Payer: Self-pay | Admitting: Internal Medicine

## 2018-08-16 ENCOUNTER — Ambulatory Visit (INDEPENDENT_AMBULATORY_CARE_PROVIDER_SITE_OTHER): Payer: Medicare Other | Admitting: Internal Medicine

## 2018-08-16 ENCOUNTER — Other Ambulatory Visit (INDEPENDENT_AMBULATORY_CARE_PROVIDER_SITE_OTHER): Payer: Medicare Other

## 2018-08-16 ENCOUNTER — Other Ambulatory Visit: Payer: Self-pay | Admitting: Internal Medicine

## 2018-08-16 ENCOUNTER — Other Ambulatory Visit: Payer: Self-pay

## 2018-08-16 VITALS — BP 130/80 | HR 63 | Temp 98.2°F | Ht 74.0 in | Wt 290.4 lb

## 2018-08-16 DIAGNOSIS — B356 Tinea cruris: Secondary | ICD-10-CM | POA: Diagnosis not present

## 2018-08-16 DIAGNOSIS — R739 Hyperglycemia, unspecified: Secondary | ICD-10-CM | POA: Insufficient documentation

## 2018-08-16 DIAGNOSIS — I10 Essential (primary) hypertension: Secondary | ICD-10-CM | POA: Diagnosis not present

## 2018-08-16 DIAGNOSIS — H6122 Impacted cerumen, left ear: Secondary | ICD-10-CM

## 2018-08-16 DIAGNOSIS — N401 Enlarged prostate with lower urinary tract symptoms: Secondary | ICD-10-CM

## 2018-08-16 DIAGNOSIS — N138 Other obstructive and reflux uropathy: Secondary | ICD-10-CM

## 2018-08-16 DIAGNOSIS — G600 Hereditary motor and sensory neuropathy: Secondary | ICD-10-CM

## 2018-08-16 DIAGNOSIS — D126 Benign neoplasm of colon, unspecified: Secondary | ICD-10-CM

## 2018-08-16 DIAGNOSIS — R7301 Impaired fasting glucose: Secondary | ICD-10-CM | POA: Diagnosis not present

## 2018-08-16 DIAGNOSIS — E782 Mixed hyperlipidemia: Secondary | ICD-10-CM | POA: Diagnosis not present

## 2018-08-16 DIAGNOSIS — K219 Gastro-esophageal reflux disease without esophagitis: Secondary | ICD-10-CM | POA: Diagnosis not present

## 2018-08-16 DIAGNOSIS — Z0000 Encounter for general adult medical examination without abnormal findings: Secondary | ICD-10-CM

## 2018-08-16 LAB — LIPID PANEL
Cholesterol: 180 mg/dL (ref 0–200)
HDL: 39.8 mg/dL (ref >39.00)
LDL Cholesterol: 114 mg/dL — ABNORMAL HIGH (ref 0–99)
NonHDL: 140.28
Total CHOL/HDL Ratio: 5
Triglycerides: 130 mg/dL (ref 0.0–149.0)
VLDL: 26 mg/dL (ref 0.0–40.0)

## 2018-08-16 LAB — COMPREHENSIVE METABOLIC PANEL
ALT: 40 U/L (ref 0–53)
AST: 31 U/L (ref 0–37)
Albumin: 4.3 g/dL (ref 3.5–5.2)
Alkaline Phosphatase: 60 U/L (ref 39–117)
BUN: 17 mg/dL (ref 6–23)
CO2: 29 mEq/L (ref 19–32)
Calcium: 9.9 mg/dL (ref 8.4–10.5)
Chloride: 102 mEq/L (ref 96–112)
Creatinine, Ser: 0.93 mg/dL (ref 0.40–1.50)
GFR: 79.53 mL/min (ref >60.00)
Glucose, Bld: 123 mg/dL — ABNORMAL HIGH (ref 70–99)
Potassium: 4.5 mEq/L (ref 3.5–5.1)
Sodium: 139 mEq/L (ref 135–145)
Total Bilirubin: 0.5 mg/dL (ref 0.2–1.2)
Total Protein: 7.2 g/dL (ref 6.0–8.3)

## 2018-08-16 LAB — CBC WITH DIFFERENTIAL/PLATELET
Basophils Absolute: 0.1 10*3/uL (ref 0.0–0.1)
Basophils Relative: 1 % (ref 0.0–3.0)
Eosinophils Absolute: 0.4 10*3/uL (ref 0.0–0.7)
Eosinophils Relative: 5.2 % — ABNORMAL HIGH (ref 0.0–5.0)
HCT: 42.1 % (ref 39.0–52.0)
Hemoglobin: 14.6 g/dL (ref 13.0–17.0)
Lymphocytes Relative: 23.6 % (ref 12.0–46.0)
Lymphs Abs: 1.7 10*3/uL (ref 0.7–4.0)
MCHC: 34.6 g/dL (ref 30.0–36.0)
MCV: 90.4 fl (ref 78.0–100.0)
Monocytes Absolute: 0.6 10*3/uL (ref 0.1–1.0)
Monocytes Relative: 8.6 % (ref 3.0–12.0)
Neutro Abs: 4.5 10*3/uL (ref 1.4–7.7)
Neutrophils Relative %: 61.6 % (ref 43.0–77.0)
Platelets: 222 10*3/uL (ref 150.0–400.0)
RBC: 4.66 Mil/uL (ref 4.22–5.81)
RDW: 13.4 % (ref 11.5–15.5)
WBC: 7.3 10*3/uL (ref 4.0–10.5)

## 2018-08-16 LAB — HEMOGLOBIN A1C: Hgb A1c MFr Bld: 6.8 % — ABNORMAL HIGH (ref 4.6–6.5)

## 2018-08-16 LAB — TSH: TSH: 1.39 u[IU]/mL (ref 0.35–4.50)

## 2018-08-16 MED ORDER — NYSTATIN 100000 UNIT/GM EX POWD: Freq: Four times a day (QID) | CUTANEOUS | 2 refills | Status: DC

## 2018-08-16 MED ORDER — PANTOPRAZOLE SODIUM 40 MG PO TBEC: 40.0000 mg | DELAYED_RELEASE_TABLET | Freq: Every day | ORAL | 2 refills | Status: DC

## 2018-08-16 NOTE — Patient Instructions (Addendum)
-It was nice seeing you today!  -Lab work today; will notify you when results are available.  -Nystatin powder 4 times a day to groin area.  -Start taking protonix 40 mg daily.  -Start shingles vaccination series at your drug store.  -Make sure you have routine eye and dental care.  -Schedule follow up with me in 4 months.   Preventive Care 64 Years and Older, Male Preventive care refers to lifestyle choices and visits with your health care provider that can promote health and wellness. What does preventive care include?   A yearly physical exam. This is also called an annual well check.  Dental exams once or twice a year.  Routine eye exams. Ask your health care provider how often you should have your eyes checked.  Personal lifestyle choices, including: ? Daily care of your teeth and gums. ? Regular physical activity. ? Eating a healthy diet. ? Avoiding tobacco and drug use. ? Limiting alcohol use. ? Practicing safe sex. ? Taking low doses of aspirin every day. ? Taking vitamin and mineral supplements as recommended by your health care provider. What happens during an annual well check? The services and screenings done by your health care provider during your annual well check will depend on your age, overall health, lifestyle risk factors, and family history of disease. Counseling Your health care provider may ask you questions about your:  Alcohol use.  Tobacco use.  Drug use.  Emotional well-being.  Home and relationship well-being.  Sexual activity.  Eating habits.  History of falls.  Memory and ability to understand (cognition).  Work and work Statistician. Screening You may have the following tests or measurements:  Height, weight, and BMI.  Blood pressure.  Lipid and cholesterol levels. These may be checked every 5 years, or more frequently if you are over 31 years old.  Skin check.  Lung cancer screening. You may have this screening every  year starting at age 29 if you have a 30-pack-year history of smoking and currently smoke or have quit within the past 15 years.  Colorectal cancer screening. All adults should have this screening starting at age 61 and continuing until age 76. You will have tests every 1-10 years, depending on your results and the type of screening test. People at increased risk should start screening at an earlier age. Screening tests may include: ? Guaiac-based fecal occult blood testing. ? Fecal immunochemical test (FIT). ? Stool DNA test. ? Virtual colonoscopy. ? Sigmoidoscopy. During this test, a flexible tube with a tiny camera (sigmoidoscope) is used to examine your rectum and lower colon. The sigmoidoscope is inserted through your anus into your rectum and lower colon. ? Colonoscopy. During this test, a long, thin, flexible tube with a tiny camera (colonoscope) is used to examine your entire colon and rectum.  Prostate cancer screening. Recommendations will vary depending on your family history and other risks.  Hepatitis C blood test.  Hepatitis B blood test.  Sexually transmitted disease (STD) testing.  Diabetes screening. This is done by checking your blood sugar (glucose) after you have not eaten for a while (fasting). You may have this done every 1-3 years.  Abdominal aortic aneurysm (AAA) screening. You may need this if you are a current or former smoker.  Osteoporosis. You may be screened starting at age 50 if you are at high risk. Talk with your health care provider about your test results, treatment options, and if necessary, the need for more tests. Vaccines Your health  care provider may recommend certain vaccines, such as:  Influenza vaccine. This is recommended every year.  Tetanus, diphtheria, and acellular pertussis (Tdap, Td) vaccine. You may need a Td booster every 10 years.  Varicella vaccine. You may need this if you have not been vaccinated.  Zoster vaccine. You may need  this after age 36.  Measles, mumps, and rubella (MMR) vaccine. You may need at least one dose of MMR if you were born in 1957 or later. You may also need a second dose.  Pneumococcal 13-valent conjugate (PCV13) vaccine. One dose is recommended after age 41.  Pneumococcal polysaccharide (PPSV23) vaccine. One dose is recommended after age 81.  Meningococcal vaccine. You may need this if you have certain conditions.  Hepatitis A vaccine. You may need this if you have certain conditions or if you travel or work in places where you may be exposed to hepatitis A.  Hepatitis B vaccine. You may need this if you have certain conditions or if you travel or work in places where you may be exposed to hepatitis B.  Haemophilus influenzae type b (Hib) vaccine. You may need this if you have certain risk factors. Talk to your health care provider about which screenings and vaccines you need and how often you need them. This information is not intended to replace advice given to you by your health care provider. Make sure you discuss any questions you have with your health care provider. Document Released: 04/18/2015 Document Revised: 05/12/2017 Document Reviewed: 01/21/2015 Elsevier Interactive Patient Education  2019 [employer].

## 2018-08-16 NOTE — Progress Notes (Signed)
Established Patient Office Visit     CC/Reason for Visit: Patient is here for his annual subsequent Medicare wellness visit as well as follow-up on chronic conditions  HPI: Ralph Garrett is a 73 y.o. male who is coming in today for the above mentioned reasons. Past Medical History is significant for:  BPH with symptoms that are fairly well controlled on Avodart, hypertension on lisinopril with good control.  He also has a history of hyperlipidemia that tried statins in the past but had significant myalgias and this was discontinued.  He also has a couple of acute complaints today.  He has been dealing with a jock itch and has tried over-the-counter medications without relief.  He also has a past history of GERD and had been on Nexium for many years that was discontinued, he has now had increased belching and is wondering if  he needs to go back on medication.   Past Medical/Surgical History: Past Medical History:  Diagnosis Date  . BPH (benign prostatic hyperplasia)   . Coronary artery disease   . Diverticulosis of colon   . GERD (gastroesophageal reflux disease)   . History of bacterial meningitis 1987  . Hx of colonic polyps   . Hypercholesteremia    no per pt  . Hypertension   . Increased prostate specific antigen (PSA) velocity   . Meniscus tear    left knee  . Obesity   . Peroneal muscular atrophy   . Sleep apnea    wears cpap    Past Surgical History:  Procedure Laterality Date  . CHOLECYSTECTOMY    . COLONOSCOPY  02/2004   Dr.Streck  . KNEE ARTHROSCOPY Left 04/17/2014   Procedure: LEFT KNEE ARTHROSCOPY WITH MEDIAL MENISCAL DEBRIDEMENT  ;  Surgeon: Gearlean Alf, MD;  Location: [facility];  Service: Orthopedics;  Laterality: Left;  . POLYPECTOMY    . TRIGGER FINGER RELEASE  2009   right hand--Dr Burney Gauze  . UPPER GASTROINTESTINAL ENDOSCOPY      Social History:  reports that he quit smoking about 19 years ago. His smoking use included cigarettes. He has a  30.00 pack-year smoking history. He has never used smokeless tobacco. He reports current alcohol use. He reports that he does not use drugs.  Allergies: Allergies  Allergen Reactions  . Penicillins     REACTION: rash as a child    Family History:  Family History  Problem Relation Age of Onset  . COPD Mother   . Charcot-Marie-Tooth disease Mother   . Heart disease Father   . Hypertension Father   . Prostate cancer Father        dx in his 45's  . Cancer - Other Brother   . Lung cancer Brother   . Charcot-Marie-Tooth disease Maternal Grandmother   . Colon cancer Neg Hx   . Esophageal cancer Neg Hx   . Stomach cancer Neg Hx   . Rectal cancer Neg Hx   . Liver cancer Neg Hx   . Pancreatic cancer Neg Hx      Current Outpatient Medications:  .  aspirin 81 MG tablet, Take 81 mg by mouth daily. , Disp: , Rfl:  .  DiphenhydrAMINE HCl (BENADRYL ALLERGY PO), Take 1 tablet by mouth as needed. , Disp: , Rfl:  .  Dutasteride (AVODART PO), Take by mouth daily at 6 (six) AM. , Disp: , Rfl:  .  lisinopril (PRINIVIL,ZESTRIL) 20 MG tablet, TAKE 1 TABLET BY MOUTH ONCE DAILY, Disp: 90 tablet,  Rfl: 1 .  nystatin (MYCOSTATIN/NYSTOP) powder, Apply topically 4 (four) times daily., Disp: 15 g, Rfl: 2 .  pantoprazole (PROTONIX) 40 MG tablet, Take 1 tablet (40 mg total) by mouth daily., Disp: 30 tablet, Rfl: 2  Current Facility-Administered Medications:  .  0.9 %  sodium chloride infusion, 500 mL, Intravenous, Once, Pyrtle, Lajuan Lines, MD  Review of Systems:  Constitutional: Denies fever, chills, diaphoresis, appetite change and fatigue.  HEENT: Denies photophobia, eye pain, redness, hearing loss, ear pain, congestion, sore throat, rhinorrhea, sneezing, mouth sores, trouble swallowing, neck pain, neck stiffness and tinnitus.   Respiratory: Denies SOB, DOE, cough, chest tightness,  and wheezing.   Cardiovascular: Denies chest pain, palpitations and leg swelling.  Gastrointestinal: Denies nausea, vomiting,  abdominal pain, diarrhea, constipation, blood in stool and abdominal distention.  Genitourinary: Denies dysuria, urgency, frequency, hematuria, flank pain and difficulty urinating.  Endocrine: Denies: hot or cold intolerance, sweats, changes in hair or nails, polyuria, polydipsia. Musculoskeletal: Denies myalgias, back pain, joint swelling, arthralgias and gait problem.  Skin: Denies pallor, rash and wound.  Neurological: Denies dizziness, seizures, syncope, weakness, light-headedness, numbness and headaches.  Hematological: Denies adenopathy. Easy bruising, personal or family bleeding history  Psychiatric/Behavioral: Denies suicidal ideation, mood changes, confusion, nervousness, sleep disturbance and agitation    Physical Exam: Vitals:   08/16/18 0801  BP: 130/80  Pulse: 63  Temp: 98.2 F (36.8 C)  TempSrc: Oral  SpO2: 97%  Weight: 290 lb 6.4 oz (131.7 kg)  Height: 6' 2" (1.88 m)    Body mass index is 37.29 kg/m.   Constitutional: NAD, calm, comfortable Eyes: PERRL, lids and conjunctivae normal ENMT: Mucous membranes are moist. Posterior pharynx clear of any exudate or lesions. Normal dentition. Tympanic membrane is pearly white, no erythema or bulging on the right, the left is obstructed by cerumen. Neck: normal, supple, no masses, no thyromegaly Respiratory: clear to auscultation bilaterally, no wheezing, no crackles. Normal respiratory effort. No accessory muscle use.  Cardiovascular: Regular rate and rhythm, no murmurs / rubs / gallops. No extremity edema. 2+ pedal pulses. No carotid bruits.  Abdomen: no tenderness, no masses palpated. No hepatosplenomegaly. Bowel sounds positive.  Musculoskeletal: no clubbing / cyanosis. No joint deformity upper and lower extremities. Good ROM, no contractures. Normal muscle tone.  Skin: no rashes, lesions, ulcers. No induration Neurologic: CN 2-12 grossly intact. Sensation intact, DTR normal. Strength 5/5 in all 4.  Psychiatric: Normal  judgment and insight. Alert and oriented x 3. Normal mood.    Subsequent Medicare wellness visit   1. Risk factors, based on past  M,S,F -cardiovascular disease risk factors include a history of hypertension, hypercholesterolemia and obesity   2.  Physical activities: He walks at least 3 times a week   3.  Depression/mood:  Mood is stable, not depressed   4.  Hearing:  No issues   5.  ADL's: Independent in all LDLs   6.  Fall risk:  Low fall risk   7.  Home safety: No problems identified   8.  Height weight, and visual acuity: Height and weight as per chart, sees eye MD every year for visual acuity, please refer to their notes   9.  Counseling:  Have discussed healthy lifestyle in order to reduce risk of cardiovascular disease   10. Lab orders based on risk factors: Laboratory update will be reviewed   11. Referral :  None today   12. Care plan:  Continue treatment of chronic medical conditions, follow-up in 4 months  13. Cognitive assessment:  Not cognitively impaired   14. Screening: Patient provided with a written and personalized 5-10 year screening schedule in the AVS.   yes   15. Provider List Update:   PCP, urology  16. Advance Directives: full code   Impression and Plan:  Encounter for preventive health examination -Have advised routine eye and dental care. -He is due for repeat colonoscopy in June, was a 1 year callback due to many polyps. -Have advised shingles vaccination series to be administered at drugstore, he agrees to have this done.  Other immunizations are up-to-date. -Labs to be updated and reviewed today.  Essential hypertension -Well-controlled on current regimen of lisinopril.  Gastroesophageal reflux disease without esophagitis  -I believe his belching is likely an indication of recurrent dyspepsia, restart Protonix 40 mg daily for 12 weeks.  Charcot-Marie-Tooth disease -Follow-up with podiatry as scheduled.  Mixed hyperlipidemia -He  has an intolerance to statins, last LDL was 102, update lipids today.  Benign prostatic hyperplasia with urinary obstruction -Continue Avodart. -Follow-up with urology as scheduled.  Jock itch  -Nystatin powder.  Impacted cerumen of left ear. -Cerumen Desimpaction  Warm water was applied and gentle ear lavage performed on left ear. There were no complications and following the desimpaction the tympanic membranes were visible. Tympanic membranes are intact following the procedure. Auditory canals are normal. The patient reported relief of symptoms after removal of cerumen.     Patient Instructions  -It was nice seeing you today!  -Lab work today; will notify you when results are available.  -Nystatin powder 4 times a day to groin area.  -Start taking protonix 40 mg daily.  -Start shingles vaccination series at your drug store.  -Make sure you have routine eye and dental care.  -Schedule follow up with me in 4 months.   Preventive Care 71 Years and Older, Male Preventive care refers to lifestyle choices and visits with your health care provider that can promote health and wellness. What does preventive care include?   A yearly physical exam. This is also called an annual well check.  Dental exams once or twice a year.  Routine eye exams. Ask your health care provider how often you should have your eyes checked.  Personal lifestyle choices, including: ? Daily care of your teeth and gums. ? Regular physical activity. ? Eating a healthy diet. ? Avoiding tobacco and drug use. ? Limiting alcohol use. ? Practicing safe sex. ? Taking low doses of aspirin every day. ? Taking vitamin and mineral supplements as recommended by your health care provider. What happens during an annual well check? The services and screenings done by your health care provider during your annual well check will depend on your age, overall health, lifestyle risk factors, and family history of  disease. Counseling Your health care provider may ask you questions about your:  Alcohol use.  Tobacco use.  Drug use.  Emotional well-being.  Home and relationship well-being.  Sexual activity.  Eating habits.  History of falls.  Memory and ability to understand (cognition).  Work and work Statistician. Screening You may have the following tests or measurements:  Height, weight, and BMI.  Blood pressure.  Lipid and cholesterol levels. These may be checked every 5 years, or more frequently if you are over 60 years old.  Skin check.  Lung cancer screening. You may have this screening every year starting at age 60 if you have a 30-pack-year history of smoking and currently smoke or have quit within  the past 15 years.  Colorectal cancer screening. All adults should have this screening starting at age 59 and continuing until age 66. You will have tests every 1-10 years, depending on your results and the type of screening test. People at increased risk should start screening at an earlier age. Screening tests may include: ? Guaiac-based fecal occult blood testing. ? Fecal immunochemical test (FIT). ? Stool DNA test. ? Virtual colonoscopy. ? Sigmoidoscopy. During this test, a flexible tube with a tiny camera (sigmoidoscope) is used to examine your rectum and lower colon. The sigmoidoscope is inserted through your anus into your rectum and lower colon. ? Colonoscopy. During this test, a long, thin, flexible tube with a tiny camera (colonoscope) is used to examine your entire colon and rectum.  Prostate cancer screening. Recommendations will vary depending on your family history and other risks.  Hepatitis C blood test.  Hepatitis B blood test.  Sexually transmitted disease (STD) testing.  Diabetes screening. This is done by checking your blood sugar (glucose) after you have not eaten for a while (fasting). You may have this done every 1-3 years.  Abdominal aortic  aneurysm (AAA) screening. You may need this if you are a current or former smoker.  Osteoporosis. You may be screened starting at age 48 if you are at high risk. Talk with your health care provider about your test results, treatment options, and if necessary, the need for more tests. Vaccines Your health care provider may recommend certain vaccines, such as:  Influenza vaccine. This is recommended every year.  Tetanus, diphtheria, and acellular pertussis (Tdap, Td) vaccine. You may need a Td booster every 10 years.  Varicella vaccine. You may need this if you have not been vaccinated.  Zoster vaccine. You may need this after age 43.  Measles, mumps, and rubella (MMR) vaccine. You may need at least one dose of MMR if you were born in 1957 or later. You may also need a second dose.  Pneumococcal 13-valent conjugate (PCV13) vaccine. One dose is recommended after age 29.  Pneumococcal polysaccharide (PPSV23) vaccine. One dose is recommended after age 59.  Meningococcal vaccine. You may need this if you have certain conditions.  Hepatitis A vaccine. You may need this if you have certain conditions or if you travel or work in places where you may be exposed to hepatitis A.  Hepatitis B vaccine. You may need this if you have certain conditions or if you travel or work in places where you may be exposed to hepatitis B.  Haemophilus influenzae type b (Hib) vaccine. You may need this if you have certain risk factors. Talk to your health care provider about which screenings and vaccines you need and how often you need them. This information is not intended to replace advice given to you by your health care provider. Make sure you discuss any questions you have with your health care provider. Document Released: 04/18/2015 Document Revised: 05/12/2017 Document Reviewed: 01/21/2015 Elsevier Interactive Patient Education  2019 [place], [place] [facility]

## 2018-08-16 NOTE — Addendum Note (Signed)
Addended by: Gwynne Edinger on: 08/16/2018 01:41 PM   Modules accepted: Orders

## 2018-08-17 ENCOUNTER — Other Ambulatory Visit: Payer: Self-pay | Admitting: Internal Medicine

## 2018-08-17 ENCOUNTER — Encounter: Payer: Self-pay | Admitting: Internal Medicine

## 2018-08-17 DIAGNOSIS — E119 Type 2 diabetes mellitus without complications: Secondary | ICD-10-CM | POA: Insufficient documentation

## 2018-08-17 MED ORDER — METFORMIN HCL 500 MG PO TABS: 500.0000 mg | ORAL_TABLET | Freq: Two times a day (BID) | ORAL | 3 refills | Status: DC

## 2018-09-11 ENCOUNTER — Other Ambulatory Visit: Payer: Self-pay | Admitting: Internal Medicine

## 2018-09-11 DIAGNOSIS — K219 Gastro-esophageal reflux disease without esophagitis: Secondary | ICD-10-CM

## 2018-09-21 ENCOUNTER — Encounter: Payer: Self-pay | Admitting: Internal Medicine

## 2018-11-27 ENCOUNTER — Ambulatory Visit (INDEPENDENT_AMBULATORY_CARE_PROVIDER_SITE_OTHER): Payer: Medicare Other | Admitting: Podiatry

## 2018-11-27 ENCOUNTER — Other Ambulatory Visit: Payer: Self-pay

## 2018-11-27 ENCOUNTER — Encounter: Payer: Self-pay | Admitting: Podiatry

## 2018-11-27 VITALS — Temp 97.6°F

## 2018-11-27 DIAGNOSIS — L84 Corns and callosities: Secondary | ICD-10-CM

## 2018-11-27 DIAGNOSIS — E119 Type 2 diabetes mellitus without complications: Secondary | ICD-10-CM

## 2018-11-27 DIAGNOSIS — M7752 Other enthesopathy of left foot: Secondary | ICD-10-CM

## 2018-11-27 NOTE — Patient Instructions (Addendum)
Diabetes Mellitus and Foot Care Foot care is an important part of your health, especially when you have diabetes. Diabetes may cause you to have problems because of poor blood flow (circulation) to your feet and legs, which can cause your skin to:  Become thinner and drier.  Break more easily.  Heal more slowly.  Peel and crack. You may also have nerve damage (neuropathy) in your legs and feet, causing decreased feeling in them. This means that you may not notice minor injuries to your feet that could lead to more serious problems. Noticing and addressing any potential problems early is the best way to prevent future foot problems. How to care for your feet Foot hygiene  Wash your feet daily with warm water and mild soap. Do not use hot water. Then, pat your feet and the areas between your toes until they are completely dry. Do not soak your feet as this can dry your skin.  Trim your toenails straight across. Do not dig under them or around the cuticle. File the edges of your nails with an emery board or nail file.  Apply a moisturizing lotion or petroleum jelly to the skin on your feet and to dry, brittle toenails. Use lotion that does not contain alcohol and is unscented. Do not apply lotion between your toes. Shoes and socks  Wear clean socks or stockings every day. Make sure they are not too tight. Do not wear knee-high stockings since they may decrease blood flow to your legs.  Wear shoes that fit properly and have enough cushioning. Always look in your shoes before you put them on to be sure there are no objects inside.  To break in new shoes, wear them for just a few hours a day. This prevents injuries on your feet. Wounds, scrapes, corns, and calluses  Check your feet daily for blisters, cuts, bruises, sores, and redness. If you cannot see the bottom of your feet, use a mirror or ask someone for help.  Do not cut corns or calluses or try to remove them with medicine.  If you  find a minor scrape, cut, or break in the skin on your feet, keep it and the skin around it clean and dry. You may clean these areas with mild soap and water. Do not clean the area with peroxide, alcohol, or iodine.  If you have a wound, scrape, corn, or callus on your foot, look at it several times a day to make sure it is healing and not infected. Check for: ? Redness, swelling, or pain. ? Fluid or blood. ? Warmth. ? Pus or a bad smell. General instructions  Do not cross your legs. This may decrease blood flow to your feet.  Do not use heating pads or hot water bottles on your feet. They may burn your skin. If you have lost feeling in your feet or legs, you may not know this is happening until it is too late.  Protect your feet from hot and cold by wearing shoes, such as at the beach or on hot pavement.  Schedule a complete foot exam at least once a year (annually) or more often if you have foot problems. If you have foot problems, report any cuts, sores, or bruises to your health care provider immediately. Contact a health care provider if:  You have a medical condition that increases your risk of infection and you have any cuts, sores, or bruises on your feet.  You have an injury that is not   healing.  You have redness on your legs or feet.  You feel burning or tingling in your legs or feet.  You have pain or cramps in your legs and feet.  Your legs or feet are numb.  Your feet always feel cold.  You have pain around a toenail. Get help right away if:  You have a wound, scrape, corn, or callus on your foot and: ? You have pain, swelling, or redness that gets worse. ? You have fluid or blood coming from the wound, scrape, corn, or callus. ? Your wound, scrape, corn, or callus feels warm to the touch. ? You have pus or a bad smell coming from the wound, scrape, corn, or callus. ? You have a fever. ? You have a red line going up your leg. Summary  Check your feet every day  for cuts, sores, red spots, swelling, and blisters.  Moisturize feet and legs daily.  Wear shoes that fit properly and have enough cushioning.  If you have foot problems, report any cuts, sores, or bruises to your health care provider immediately.  Schedule a complete foot exam at least once a year (annually) or more often if you have foot problems. This information is not intended to replace advice given to you by your health care provider. Make sure you discuss any questions you have with your health care provider. Document Released: 03/19/2000 Document Revised: 05/04/2017 Document Reviewed: 04/23/2016 Elsevier Patient Education  2020 [employer]  Corns and Calluses Corns are small areas of thickened skin that occur on the top, sides, or tip of a toe. They contain a cone-shaped core with a point that can press on a nerve below. This causes pain.  Calluses are areas of thickened skin that can occur anywhere on the body, including the hands, fingers, palms, soles of the feet, and heels. Calluses are usually larger than corns. What are the causes? Corns and calluses are caused by rubbing (friction) or pressure, such as from shoes that are too tight or do not fit properly. What increases the risk? Corns are more likely to develop in people who have misshapen toes (toe deformities), such as hammer toes. Calluses can occur with friction to any area of the skin. They are more likely to develop in people who:  Work with their hands.  Wear shoes that fit poorly, are too tight, or are high-heeled.  Have toe deformities. What are the signs or symptoms? Symptoms of a corn or callus include:  A hard growth on the skin.  Pain or tenderness under the skin.  Redness and swelling.  Increased discomfort while wearing tight-fitting shoes, if your feet are affected. If a corn or callus becomes infected, symptoms may include:  Redness and swelling that gets worse.  Pain.  Fluid, blood, or  pus draining from the corn or callus. How is this diagnosed? Corns and calluses may be diagnosed based on your symptoms, your medical history, and a physical exam. How is this treated? Treatment for corns and calluses may include:  Removing the cause of the friction or pressure. This may involve: ? Changing your shoes. ? Wearing shoe inserts (orthotics) or other protective layers in your shoes, such as a corn pad. ? Wearing gloves.  Applying medicine to the skin (topical medicine) to help soften skin in the hardened, thickened areas.  Removing layers of dead skin with a file to reduce the size of the corn or callus.  Removing the corn or callus with a scalpel or   laser.  Taking antibiotic medicines, if your corn or callus is infected.  Having surgery, if a toe deformity is the cause. Follow these instructions at home:   Take over-the-counter and prescription medicines only as told by your health care provider.  If you were prescribed an antibiotic, take it as told by your health care provider. Do not stop taking it even if your condition starts to improve.  Wear shoes that fit well. Avoid wearing high-heeled shoes and shoes that are too tight or too loose.  Wear any padding, protective layers, gloves, or orthotics as told by your health care provider.  Soak your hands or feet and then use a file or pumice stone to soften your corn or callus. Do this as told by your health care provider.  Check your corn or callus every day for symptoms of infection. Contact a health care provider if you:  Notice that your symptoms do not improve with treatment.  Have redness or swelling that gets worse.  Notice that your corn or callus becomes painful.  Have fluid, blood, or pus coming from your corn or callus.  Have new symptoms. Summary  Corns are small areas of thickened skin that occur on the top, sides, or tip of a toe.  Calluses are areas of thickened skin that can occur anywhere  on the body, including the hands, fingers, palms, and soles of the feet. Calluses are usually larger than corns.  Corns and calluses are caused by rubbing (friction) or pressure, such as from shoes that are too tight or do not fit properly.  Treatment may include wearing any padding, protective layers, gloves, or orthotics as told by your health care provider. This information is not intended to replace advice given to you by your health care provider. Make sure you discuss any questions you have with your health care provider. Document Released: 12/27/2003 Document Revised: 07/12/2018 Document Reviewed: 02/02/2017 Elsevier Patient Education  2020 [employer].  1.

## 2018-11-28 ENCOUNTER — Encounter: Payer: Self-pay | Admitting: Internal Medicine

## 2018-12-04 NOTE — Progress Notes (Signed)
Subjective: Ralph Garrett presents to clinic with recently new diagnosis of diabetes and history of Charcot-Marie-Tooth disease.  He presents with cc of painful mycotic toenails and painful calluses of both feet which are aggravated when weightbearing with and without shoe gear.  This pain limits his daily activities. Pain symptoms resolve with periodic professional debridement.  Today patient relates pain of the left fifth metatarsal joint.  He points to the lateral aspect of fifth metatarsal head.  He relates he does not understand why his pain came back so some this time.  He says he is usually able to go a year without his calluses being treated.   Current Outpatient Medications:  .  aspirin 81 MG tablet, Take 81 mg by mouth daily. , Disp: , Rfl:  .  DiphenhydrAMINE HCl (BENADRYL ALLERGY PO), Take 1 tablet by mouth as needed. , Disp: , Rfl:  .  Dutasteride (AVODART PO), Take by mouth daily at 6 (six) AM. , Disp: , Rfl:  .  dutasteride (AVODART) 0.5 MG capsule, , Disp: , Rfl:  .  lisinopril (PRINIVIL,ZESTRIL) 20 MG tablet, TAKE 1 TABLET BY MOUTH ONCE DAILY, Disp: 90 tablet, Rfl: 1 .  metFORMIN (GLUCOPHAGE) 500 MG tablet, Take 1 tablet (500 mg total) by mouth 2 (two) times daily with a meal., Disp: 180 tablet, Rfl: 3 .  nystatin (MYCOSTATIN/NYSTOP) powder, Apply topically 4 (four) times daily., Disp: 15 g, Rfl: 2 .  pantoprazole (PROTONIX) 40 MG tablet, TAKE 1 TABLET(40 MG) BY MOUTH DAILY, Disp: 90 tablet, Rfl: 1  Current Facility-Administered Medications:  .  0.9 %  sodium chloride infusion, 500 mL, Intravenous, Once, Pyrtle, Lajuan Lines, MD   Allergies  Allergen Reactions  . Penicillins     REACTION: rash as a child     Objective: Vitals:   11/27/18 0918  Temp: 97.6 F (36.4 C)    Physical Examination:  Vascular  Examination: Capillary refill time less than 3 seconds to 10 digits.  Dorsalis pedis and posterior tibial pulses remain palpable bilaterally.  Digital hair is present x  10 digits.  Skin temperature gradient remains within normal limits bilaterally.    Dermatological Examination: Skin with normal turgor, texture and tone b/l.  Toenails 1 through 5 bilaterally are adequate length today.  Moderate hyperkeratotic lesions noted submetatarsal heads 1, 5 bilaterally.  There is tenderness to palpation bilaterally.  There is no erythema, no edema, no drainage, no flocculence associated with either of these lesions.  Musculoskeletal Examination: Muscle strength 5/5 to all muscle groups b/l.  Pes cavus foot type noted bilaterally. No pain, crepitus or joint discomfort with active/passive ROM.  Patient does have tenderness to the lateral aspect of the fifth metatarsal phalangeal joint.  There is tenderness to range of motion of the joint.  This is consistent with capsulitis.  Neurological Examination: Sensation diminished when tested with 10 gram monofilament.  Vibratory sensation intact b/l.  Assessment: 1. Calluses submetatarsal heads 1, 5 bilaterally 2. Capsulitis fifth metatarsal phalangeal joint of the left foot 3. Charcot-Marie-Tooth disease 4. NIDDM.  Encounter for diabetic foot exam  Plan: 1.  Calluses pared submetatarsal head(s) 1, 5 bilaterally utilizing sterile scalpel blade without incident.  I discussed the need for more frequent visits.  This is warranted especially because he is now been diagnosed with diabetes.  I explained to him the calluses could turn into ulcerations and these are what lead to amputations.  Patient related understanding.  He did agree to a 41-month follow-up appointment with the  understanding that if anything occurs in the interim he will call to come in sooner. I discussed treatment options for capsulitis of the left fifth metatarsophalangeal joint.  Patient opted for steroid injection of the joint.  A sterile prep was performed and an injection consisting of a mixture of 1 cc Celestone Soluspan and 1 cc of 0.5% Marcaine plain was  administered to the joint.  A Band-Aid was applied to the area of the injection.  He may remove the Band-Aid tomorrow. Continue soft, supportive shoe gear daily. Report any pedal injuries to medical professional. Patient/POA to call should there be a question/concern in there interim.

## 2018-12-07 ENCOUNTER — Other Ambulatory Visit: Payer: Self-pay | Admitting: Internal Medicine

## 2018-12-13 ENCOUNTER — Other Ambulatory Visit: Payer: Self-pay

## 2018-12-13 ENCOUNTER — Ambulatory Visit (AMBULATORY_SURGERY_CENTER): Payer: Self-pay | Admitting: *Deleted

## 2018-12-13 ENCOUNTER — Encounter: Payer: Medicare Other | Attending: Internal Medicine | Admitting: *Deleted

## 2018-12-13 VITALS — Temp 96.5°F | Ht 73.0 in | Wt 280.0 lb

## 2018-12-13 DIAGNOSIS — E119 Type 2 diabetes mellitus without complications: Secondary | ICD-10-CM

## 2018-12-13 DIAGNOSIS — Z86010 Personal history of colonic polyps: Secondary | ICD-10-CM

## 2018-12-13 MED ORDER — PEG 3350-KCL-NA BICARB-NACL 420 G PO SOLR: 4000.0000 mL | Freq: Once | ORAL | 0 refills | Status: AC

## 2018-12-13 NOTE — Progress Notes (Signed)
Diabetes Self-Management Education  Visit Type: First/Initial  Appt. Start Time: 0930 Appt. End Time: 1100  12/13/2018  [name], identified by name and date of birth, is a 73 y.o. male with a diagnosis of Diabetes: Type 2. Patient is here with his wife who participated in the visit. He states long history of Pre-diabetes (over 10 years) and his father had diabetes at a much younger age. He does not yet have a meter and would like an overall explanation of diabetes, monitoring and meal planning today.  ASSESSMENT  There were no vitals taken for this visit. There is no height or weight on file to calculate BMI.  Diabetes Self-Management Education - 12/13/18 0947      Visit Information   Visit Type  First/Initial      Initial Visit   Diabetes Type  Type 2    Are you currently following a meal plan?  No    Are you taking your medications as prescribed?  Yes    Date Diagnosed  3 months ago      Health Coping   How would you rate your overall health?  Good      Psychosocial Assessment   Patient Belief/Attitude about Diabetes  Denial    Self-care barriers  None    Self-management support  Family;Doctor's office    Other persons present  Patient;Spouse/SO    Patient Concerns  Nutrition/Meal planning;Weight Control;Glycemic Control    Special Needs  None    Preferred Learning Style  Auditory;Visual;Hands on    [place] in progress    How often do you need to have someone help you when you read instructions, pamphlets, or other written materials from your doctor or pharmacy?  1 - Never    What is the last grade level you completed in school?  associates degree      Pre-Education Assessment   Patient understands the diabetes disease and treatment process.  Needs Instruction    Patient understands incorporating nutritional management into lifestyle.  Needs Instruction    Patient undertands incorporating physical activity into lifestyle.  Needs Instruction     Patient understands using medications safely.  Needs Instruction    Patient understands monitoring blood glucose, interpreting and using results  Needs Instruction    Patient understands prevention, detection, and treatment of acute complications.  Needs Instruction    Patient understands prevention, detection, and treatment of chronic complications.  Needs Instruction    Patient understands how to develop strategies to address psychosocial issues.  Needs Instruction    Patient understands how to develop strategies to promote health/change behavior.  Needs Instruction      Complications   Last HgB A1C per patient/outside source  6.7 %    How often do you check your blood sugar?  0 times/day (not testing)    Number of hypoglycemic episodes per month  0    Have you had a dilated eye exam in the past 12 months?  Yes    Have you had a dental exam in the past 12 months?  Yes    Are you checking your feet?  No      Dietary Intake   Breakfast  protein bar    Snack (morning)  fresh fruit, V-8 juice    Lunch  sandwich x 1, chips or restaurant meal as meal salad etc    Snack (afternoon)  fresh fruit    Dinner  sandwich, meat, starch and veg meal about once  a week    Snack (evening)  popcorn in bag    Beverage(s)  coffee with cream and Splenda, water, diet soda, 1/2 and 1/2 tea      Exercise   Exercise Type  Light (walking / raking leaves)    How many days per week to you exercise?  3    How many minutes per day do you exercise?  20    Total minutes per week of exercise  60      Individualized Goals (developed by patient)   Nutrition  Follow meal plan discussed    Physical Activity  Exercise 3-5 times per week    Monitoring   test blood glucose pre and post meals as discussed      Outcomes   Expected Outcomes  Demonstrated interest in learning. Expect positive outcomes    Future DMSE  PRN    Program Status  Not Completed       Individualized Plan for Diabetes Self-Management Training:    Learning Objective:  Patient will have a greater understanding of diabetes self-management. Patient education plan is to attend individual and/or group sessions per assessed needs and concerns.   Plan:   Patient Instructions  Plan:  Aim for 3-4 Carb Choices per meal (45-60 grams)  Aim for 0-2 Carbs per snack if hungry  Include protein in moderation with your meals and snacks Consider reading food labels for Total Carbohydrate of foods Consider  increasing your activity level by walking or biking for 30-60 minutes daily as tolerated Consider checking BG at alternate times per day  Consider taking medication as directed by MD  Expected Outcomes:  Demonstrated interest in learning. Expect positive outcomes  Education material provided: Food label handouts, A1C conversion sheet, Meal plan card and Carbohydrate counting sheet  If problems or questions, patient to contact team via:  Phone  Future DSME appointment: PRN

## 2018-12-13 NOTE — Patient Instructions (Signed)
Plan:  Aim for 3-4 Carb Choices per meal (45-60 grams)  Aim for 0-2 Carbs per snack if hungry  Include protein in moderation with your meals and snacks Consider reading food labels for Total Carbohydrate of foods Consider  increasing your activity level by walking or biking for 30-60 minutes daily as tolerated Consider checking BG at alternate times per day  Consider taking medication as directed by MD

## 2018-12-13 NOTE — Progress Notes (Signed)
No egg or soy allergy known to patient  No issues with past sedation with any surgeries  or procedures, no intubation problems  No diet pills per patient No home 02 use per patient  No blood thinners per patient  Pt denies issues with constipation  No A fib or A flutter   

## 2018-12-26 ENCOUNTER — Telehealth: Payer: Self-pay

## 2018-12-26 NOTE — Telephone Encounter (Signed)
Covid-19 screening questions   Do you now or have you had a fever in the last 14 days? NO   Do you have any respiratory symptoms of shortness of breath or cough now or in the last 14 days? NO  Do you have any family members or close contacts with diagnosed or suspected Covid-19 in the past 14 days? NO  Have you been tested for Covid-19 and found to be positive? NO        

## 2018-12-27 ENCOUNTER — Encounter: Payer: Self-pay | Admitting: Internal Medicine

## 2018-12-27 ENCOUNTER — Other Ambulatory Visit: Payer: Self-pay

## 2018-12-27 ENCOUNTER — Ambulatory Visit (AMBULATORY_SURGERY_CENTER): Payer: Medicare Other | Admitting: Internal Medicine

## 2018-12-27 VITALS — BP 121/65 | HR 56 | Temp 87.8°F | Resp 12 | Ht 73.0 in | Wt 280.0 lb

## 2018-12-27 DIAGNOSIS — Z86010 Personal history of colonic polyps: Secondary | ICD-10-CM

## 2018-12-27 DIAGNOSIS — K5730 Diverticulosis of large intestine without perforation or abscess without bleeding: Secondary | ICD-10-CM | POA: Diagnosis not present

## 2018-12-27 DIAGNOSIS — K635 Polyp of colon: Secondary | ICD-10-CM | POA: Diagnosis not present

## 2018-12-27 DIAGNOSIS — K621 Rectal polyp: Secondary | ICD-10-CM

## 2018-12-27 DIAGNOSIS — D128 Benign neoplasm of rectum: Secondary | ICD-10-CM

## 2018-12-27 MED ORDER — SODIUM CHLORIDE 0.9 % IV SOLN: 500.0000 mL | Freq: Once | INTRAVENOUS | Status: DC

## 2018-12-27 NOTE — Progress Notes (Signed)
Called to room to assist during endoscopic procedure.  Patient ID and intended procedure confirmed with present staff. Received instructions for my participation in the procedure from the performing physician.  

## 2018-12-27 NOTE — Patient Instructions (Signed)
Handouts given for polyps, hemorrhoids and diverticulosis.  YOU HAD AN ENDOSCOPIC PROCEDURE TODAY AT THE [facility]:   Refer to the procedure report that was given to you for any specific questions about what was found during the examination.  If the procedure report does not answer your questions, please call your gastroenterologist to clarify.  If you requested that your care partner not be given the details of your procedure findings, then the procedure report has been included in a sealed envelope for you to review at your convenience later.  YOU SHOULD EXPECT: Some feelings of bloating in the abdomen. Passage of more gas than usual.  Walking can help get rid of the air that was put into your GI tract during the procedure and reduce the bloating. If you had a lower endoscopy (such as a colonoscopy or flexible sigmoidoscopy) you may notice spotting of blood in your stool or on the toilet paper. If you underwent a bowel prep for your procedure, you may not have a normal bowel movement for a few days.  Please Note:  You might notice some irritation and congestion in your nose or some drainage.  This is from the oxygen used during your procedure.  There is no need for concern and it should clear up in a day or so.  SYMPTOMS TO REPORT IMMEDIATELY:   Following lower endoscopy (colonoscopy or flexible sigmoidoscopy):  Excessive amounts of blood in the stool  Significant tenderness or worsening of abdominal pains  Swelling of the abdomen that is new, acute  Fever of 100F or higher  For urgent or emergent issues, a gastroenterologist can be reached at any hour by calling [phone].   DIET:  We do recommend a small meal at first, but then you may proceed to your regular diet.  Drink plenty of fluids but you should avoid alcoholic beverages for 24 hours.  ACTIVITY:  You should plan to take it easy for the rest of today and you should NOT DRIVE or use heavy machinery until tomorrow  (because of the sedation medicines used during the test).    FOLLOW UP: Our staff will call the number listed on your records 48-72 hours following your procedure to check on you and address any questions or concerns that you may have regarding the information given to you following your procedure. If we do not reach you, we will leave a message.  We will attempt to reach you two times.  During this call, we will ask if you have developed any symptoms of COVID 19. If you develop any symptoms (ie: fever, flu-like symptoms, shortness of breath, cough etc.) before then, please call [phone].  If you test positive for Covid 19 in the 2 weeks post procedure, please call and report this information to us.    If any biopsies were taken you will be contacted by phone or by letter within the next 1-3 weeks.  Please call us at [phone] if you have not heard about the biopsies in 3 weeks.    SIGNATURES/CONFIDENTIALITY: You and/or your care partner have signed paperwork which will be entered into your electronic medical record.  These signatures attest to the fact that that the information above on your After Visit Summary has been reviewed and is understood.  Full responsibility of the confidentiality of this discharge information lies with you and/or your care-partner. 

## 2018-12-27 NOTE — Op Note (Signed)
[facility] Patient Name: Ramari Reome Procedure Date: 12/27/2018 11:37 AM MRN: [id number] Endoscopist: Jerene Bears , MD Age: 73 Referring MD:  Date of Birth: January 24, 1946 Gender: Male Account #: [id number] Procedure:                Colonoscopy Indications:              High risk colon cancer surveillance: Personal                            history of multiple (>10) adenomas, Last                            colonoscopy: June 2019 Medicines:                Monitored Anesthesia Care Procedure:                Pre-Anesthesia Assessment:                           - Prior to the procedure, a History and Physical                            was performed, and patient medications and                            allergies were reviewed. The patient's tolerance of                            previous anesthesia was also reviewed. The risks                            and benefits of the procedure and the sedation                            options and risks were discussed with the patient.                            All questions were answered, and informed consent                            was obtained. Prior Anticoagulants: The patient has                            taken no previous anticoagulant or antiplatelet                            agents. ASA Grade Assessment: III - A patient with                            severe systemic disease. After reviewing the risks                            and benefits, the patient was deemed in  satisfactory condition to undergo the procedure.                           After obtaining informed consent, the colonoscope                            was passed under direct vision. Throughout the                            procedure, the patient's blood pressure, pulse, and                            oxygen saturations were monitored continuously. The                            Colonoscope was introduced through the anus and                            advanced to the cecum, identified by appendiceal                            orifice and ileocecal valve. The colonoscopy was                            performed without difficulty. The patient tolerated                            the procedure well. The quality of the bowel                            preparation was adequate (Golytely). The ileocecal                            valve, appendiceal orifice, and rectum were                            photographed. Scope In: 11:49:53 AM Scope Out: 12:11:25 PM Scope Withdrawal Time: 0 hours 14 minutes 33 seconds  Total Procedure Duration: 0 hours 21 minutes 32 seconds  Findings:                 The digital rectal exam was normal.                           A 5 mm polyp was found in the rectum. The polyp was                            sessile. The polyp was removed with a cold snare.                            Resection and retrieval were complete.                           Multiple small and large-mouthed diverticula were  found in the sigmoid colon, descending colon and                            transverse colon.                           Internal hemorrhoids were found during retroflexion. Complications:            No immediate complications. Estimated Blood Loss:     Estimated blood loss: none. Impression:               - One 5 mm polyp in the rectum, removed with a cold                            snare. Resected and retrieved.                           - Severe diverticulosis in the sigmoid colon, in                            the descending colon and in the transverse colon.                           - Internal hemorrhoids. Recommendation:           - Patient has a contact number available for                            emergencies. The signs and symptoms of potential                            delayed complications were discussed with the                            patient. Return to  normal activities tomorrow.                            Written discharge instructions were provided to the                            patient.                           - Resume previous diet.                           - Continue present medications.                           - Await pathology results.                           - Repeat colonoscopy in 3 years for surveillance                            given > adenomas removed in 2019. Jerene Bears, MD 12/27/2018 12:22:54 PM This report has  been signed electronically.

## 2018-12-27 NOTE — Progress Notes (Signed)
Ralph Garrett  Pt's states no medical or surgical changes since previsit or office visit.

## 2018-12-27 NOTE — Progress Notes (Signed)
Pt Drowsy. VSS. To PACU, report to RN. No anesthetic complications noted.  

## 2018-12-29 ENCOUNTER — Telehealth: Payer: Self-pay

## 2018-12-29 NOTE — Telephone Encounter (Signed)
  Follow up Call-  Call back number 12/27/2018 09/26/2017  Post procedure Call Back phone  # [phone] cell [phone] cell  Permission to leave phone message Yes Yes  Some recent data might be hidden     Patient questions:  Do you have a fever, pain , or abdominal swelling? No. Pain Score  0 *  Have you tolerated food without any problems? Yes.    Have you been able to return to your normal activities? Yes.    Do you have any questions about your discharge instructions: Diet   No. Medications  No. Follow up visit  No.  Do you have questions or concerns about your Care? No.  Actions: * If pain score is 4 or above: No action needed, pain <4. 1. Have you developed a fever since your procedure? no  2.   Have you had an respiratory symptoms (SOB or cough) since your procedure? no  3.   Have you tested positive for COVID 19 since your procedure no  4.   Have you had any family members/close contacts diagnosed with the COVID 19 since your procedure?  no   If yes to any of these questions please route to Joylene John, RN and Alphonsa Gin, Therapist, sports.

## 2019-01-02 ENCOUNTER — Encounter: Payer: Self-pay | Admitting: Internal Medicine

## 2019-01-11 DIAGNOSIS — Z23 Encounter for immunization: Secondary | ICD-10-CM | POA: Diagnosis not present

## 2019-03-27 DIAGNOSIS — R972 Elevated prostate specific antigen [PSA]: Secondary | ICD-10-CM | POA: Diagnosis not present

## 2019-05-06 DIAGNOSIS — Z23 Encounter for immunization: Secondary | ICD-10-CM | POA: Diagnosis not present

## 2019-06-03 DIAGNOSIS — Z23 Encounter for immunization: Secondary | ICD-10-CM | POA: Diagnosis not present

## 2019-06-08 ENCOUNTER — Telehealth: Payer: Self-pay

## 2019-06-08 ENCOUNTER — Other Ambulatory Visit: Payer: Self-pay | Admitting: Internal Medicine

## 2019-06-08 NOTE — Telephone Encounter (Signed)
Pt called to confirmed appointment with Dr. Elisha Ponder on Monday 06/11/19.

## 2019-06-11 ENCOUNTER — Other Ambulatory Visit: Payer: Self-pay

## 2019-06-11 ENCOUNTER — Ambulatory Visit (INDEPENDENT_AMBULATORY_CARE_PROVIDER_SITE_OTHER): Payer: Medicare Other | Admitting: Podiatry

## 2019-06-11 DIAGNOSIS — E1142 Type 2 diabetes mellitus with diabetic polyneuropathy: Secondary | ICD-10-CM

## 2019-06-11 DIAGNOSIS — L84 Corns and callosities: Secondary | ICD-10-CM | POA: Diagnosis not present

## 2019-06-11 DIAGNOSIS — Q667 Congenital pes cavus, unspecified foot: Secondary | ICD-10-CM

## 2019-06-11 DIAGNOSIS — G600 Hereditary motor and sensory neuropathy: Secondary | ICD-10-CM

## 2019-06-11 NOTE — Patient Instructions (Signed)

## 2019-06-14 ENCOUNTER — Encounter: Payer: Self-pay | Admitting: Podiatry

## 2019-06-14 NOTE — Progress Notes (Signed)
Subjective: Ralph Garrett presents today for follow up of preventative diabetic foot care and callus(es) which are painful plantar aspect of both feet and painful mycotic toenails b/l that are difficult to trim. Pain interferes with ambulation. Aggravating factors include wearing enclosed shoe gear. Pain is relieved with periodic professional debridement..   Allergies  Allergen Reactions  . Penicillins     REACTION: rash as a child     Objective: There were no vitals filed for this visit.  Pt 74 y.o. year old male  in NAD. AAO x 3.   Vascular Examination:  Capillary fill time to digits <3 seconds b/l. Palpable DP pulses b/l. Palpable PT pulses b/l. Pedal hair present b/l. Skin temperature gradient within normal limits b/l.  Dermatological Examination: Pedal skin with normal turgor, texture and tone bilaterally. No open wounds bilaterally. No interdigital macerations bilaterally. Hyperkeratotic lesion(s) submet heads 1, 5 b/l with tenderness to palpation.  No erythema, no edema, no drainage, no flocculence.  Musculoskeletal: Normal muscle strength 5/5 to all lower extremity muscle groups bilaterally, no pain crepitus or joint limitation noted with ROM b/l and pes cavus deformity noted  Neurological: Protective sensation diminished with 10g monofilament b/l. Vibratory sensation intact b/l  Assessment: 1. Callus   2. Charcot-Marie-Tooth disease   3. Pes cavus   4. Diabetic peripheral neuropathy associated with type 2 diabetes mellitus ([place])    Plan: -Continue diabetic foot care principles. Literature dispensed on today.  -Toenails 1-5 b/l were debrided in length and girth with sterile nail nippers and dremel without iatrogenic bleeding.  -Calluses submet heads 1, 5 b/l were debrided without complication or incident. Total number debrided =4. -Patient to continue soft, supportive shoe gear daily. Start procedure for diabetic shoes. Patient qualifies based on diagnoses. -Patient to  report any pedal injuries to medical professional immediately. -Patient/POA to call should there be question/concern in the interim.  Return in about 3 months (around 09/11/2019).

## 2019-06-18 ENCOUNTER — Telehealth: Payer: Self-pay | Admitting: Internal Medicine

## 2019-06-18 NOTE — Telephone Encounter (Signed)
Pt stated that [facility] is needing pt last clinical note from their last CPE. His wife and him are trying to get established their and that is a requirement in order for them to be accepted. Pt would like to pick it up from the office.   Pt can be reached at [phone] -ok to leave a detailed message per pt

## 2019-06-19 NOTE — Telephone Encounter (Signed)
Spoke with patient and he will come by and sign a ROI.  Patient also scheduled for his CPE.

## 2019-09-05 ENCOUNTER — Encounter: Payer: Self-pay | Admitting: Internal Medicine

## 2019-09-06 ENCOUNTER — Telehealth: Payer: Self-pay | Admitting: Internal Medicine

## 2019-09-10 ENCOUNTER — Encounter: Payer: Self-pay | Admitting: Podiatry

## 2019-09-10 ENCOUNTER — Other Ambulatory Visit: Payer: Self-pay

## 2019-09-10 ENCOUNTER — Ambulatory Visit (INDEPENDENT_AMBULATORY_CARE_PROVIDER_SITE_OTHER): Payer: Medicare Other | Admitting: Podiatry

## 2019-09-10 DIAGNOSIS — L84 Corns and callosities: Secondary | ICD-10-CM

## 2019-09-10 DIAGNOSIS — G600 Hereditary motor and sensory neuropathy: Secondary | ICD-10-CM | POA: Diagnosis not present

## 2019-09-10 DIAGNOSIS — Q667 Congenital pes cavus, unspecified foot: Secondary | ICD-10-CM

## 2019-09-10 DIAGNOSIS — E1142 Type 2 diabetes mellitus with diabetic polyneuropathy: Secondary | ICD-10-CM

## 2019-09-11 ENCOUNTER — Other Ambulatory Visit (INDEPENDENT_AMBULATORY_CARE_PROVIDER_SITE_OTHER): Payer: Medicare Other

## 2019-09-11 ENCOUNTER — Encounter: Payer: Self-pay | Admitting: Internal Medicine

## 2019-09-11 ENCOUNTER — Ambulatory Visit (INDEPENDENT_AMBULATORY_CARE_PROVIDER_SITE_OTHER): Payer: Medicare Other | Admitting: Internal Medicine

## 2019-09-11 VITALS — BP 128/78 | HR 73 | Temp 97.7°F | Ht 73.5 in | Wt 280.8 lb

## 2019-09-11 DIAGNOSIS — G4733 Obstructive sleep apnea (adult) (pediatric): Secondary | ICD-10-CM

## 2019-09-11 DIAGNOSIS — K219 Gastro-esophageal reflux disease without esophagitis: Secondary | ICD-10-CM | POA: Diagnosis not present

## 2019-09-11 DIAGNOSIS — Z0000 Encounter for general adult medical examination without abnormal findings: Secondary | ICD-10-CM | POA: Diagnosis not present

## 2019-09-11 DIAGNOSIS — D126 Benign neoplasm of colon, unspecified: Secondary | ICD-10-CM | POA: Diagnosis not present

## 2019-09-11 DIAGNOSIS — E782 Mixed hyperlipidemia: Secondary | ICD-10-CM

## 2019-09-11 DIAGNOSIS — Z23 Encounter for immunization: Secondary | ICD-10-CM | POA: Diagnosis not present

## 2019-09-11 DIAGNOSIS — N401 Enlarged prostate with lower urinary tract symptoms: Secondary | ICD-10-CM

## 2019-09-11 DIAGNOSIS — E119 Type 2 diabetes mellitus without complications: Secondary | ICD-10-CM | POA: Diagnosis not present

## 2019-09-11 DIAGNOSIS — N138 Other obstructive and reflux uropathy: Secondary | ICD-10-CM | POA: Diagnosis not present

## 2019-09-11 DIAGNOSIS — I10 Essential (primary) hypertension: Secondary | ICD-10-CM

## 2019-09-11 LAB — COMPREHENSIVE METABOLIC PANEL
ALT: 14 U/L (ref 0–53)
AST: 17 U/L (ref 0–37)
Albumin: 4.4 g/dL (ref 3.5–5.2)
Alkaline Phosphatase: 57 U/L (ref 39–117)
BUN: 18 mg/dL (ref 6–23)
CO2: 28 mEq/L (ref 19–32)
Calcium: 9.5 mg/dL (ref 8.4–10.5)
Chloride: 104 mEq/L (ref 96–112)
Creatinine, Ser: 0.92 mg/dL (ref 0.40–1.50)
GFR: 80.29 mL/min (ref >60.00)
Glucose, Bld: 120 mg/dL — ABNORMAL HIGH (ref 70–99)
Potassium: 4 mEq/L (ref 3.5–5.1)
Sodium: 138 mEq/L (ref 135–145)
Total Bilirubin: 0.3 mg/dL (ref 0.2–1.2)
Total Protein: 7.1 g/dL (ref 6.0–8.3)

## 2019-09-11 LAB — CBC WITH DIFFERENTIAL/PLATELET
Basophils Absolute: 0.1 10*3/uL (ref 0.0–0.1)
Basophils Relative: 0.8 % (ref 0.0–3.0)
Eosinophils Absolute: 0.5 10*3/uL (ref 0.0–0.7)
Eosinophils Relative: 6.9 % — ABNORMAL HIGH (ref 0.0–5.0)
HCT: 41 % (ref 39.0–52.0)
Hemoglobin: 14 g/dL (ref 13.0–17.0)
Lymphocytes Relative: 27.4 % (ref 12.0–46.0)
Lymphs Abs: 1.9 10*3/uL (ref 0.7–4.0)
MCHC: 34.2 g/dL (ref 30.0–36.0)
MCV: 90.1 fl (ref 78.0–100.0)
Monocytes Absolute: 0.6 10*3/uL (ref 0.1–1.0)
Monocytes Relative: 9.1 % (ref 3.0–12.0)
Neutro Abs: 3.8 10*3/uL (ref 1.4–7.7)
Neutrophils Relative %: 55.8 % (ref 43.0–77.0)
Platelets: 237 10*3/uL (ref 150.0–400.0)
RBC: 4.55 Mil/uL (ref 4.22–5.81)
RDW: 13.2 % (ref 11.5–15.5)
WBC: 6.8 10*3/uL (ref 4.0–10.5)

## 2019-09-11 LAB — POCT GLYCOSYLATED HEMOGLOBIN (HGB A1C): Hemoglobin A1C: 5.9 % — AB (ref 4.0–5.6)

## 2019-09-11 LAB — LIPID PANEL
Cholesterol: 162 mg/dL (ref 0–200)
HDL: 31.6 mg/dL — ABNORMAL LOW (ref >39.00)
LDL Cholesterol: 103 mg/dL — ABNORMAL HIGH (ref 0–99)
NonHDL: 130.63
Total CHOL/HDL Ratio: 5
Triglycerides: 139 mg/dL (ref 0.0–149.0)
VLDL: 27.8 mg/dL (ref 0.0–40.0)

## 2019-09-11 NOTE — Progress Notes (Signed)
Established Patient Office Visit     This visit occurred during the SARS-CoV-2 public health emergency.  Safety protocols were in place, including screening questions prior to the visit, additional usage of staff PPE, and extensive cleaning of exam room while observing appropriate contact time as indicated for disinfecting solutions.    CC/Reason for Visit: Yearly follow-up of chronic medical conditions and subsequent Medicare wellness visit  HPI: Ralph Garrett is a 74 y.o. male who is coming in today for the above mentioned reasons. Past Medical History is significant for: Well-controlled hypertension and type 2 diabetes, hyperlipidemia not on a statin due to statin induced myalgias, GERD, BPH who follows yearly with urology, morbid obesity, obstructive sleep apnea on CPAP.  He has had no acute issues or health changes in this past year.  He has had both of his Covid vaccines.  He visits the dentist routinely, does not have routine eye care, has not noticed any issues with his hearing, does not do formal exercising, he had a colonoscopy in 2020.  He and his wife are moving into an independent living facility soon.   Past Medical/Surgical History: Past Medical History:  Diagnosis Date  . BPH (benign prostatic hyperplasia)   . Coronary artery disease   . Diabetes mellitus without complication ([place])   . Diverticulosis of colon   . GERD (gastroesophageal reflux disease)   . History of bacterial meningitis 1987  . Hx of colonic polyps   . Hypercholesteremia    no per pt  . Hypertension   . Increased prostate specific antigen (PSA) velocity   . Meniscus tear    left knee  . Obesity   . Peroneal muscular atrophy   . Sleep apnea    wears cpap    Past Surgical History:  Procedure Laterality Date  . CHOLECYSTECTOMY    . COLONOSCOPY  02/2004   Dr.Streck  . KNEE ARTHROSCOPY Left 04/17/2014   Procedure: LEFT KNEE ARTHROSCOPY WITH MEDIAL MENISCAL DEBRIDEMENT  ;  Surgeon: Gearlean Alf, MD;  Location: [facility];  Service: Orthopedics;  Laterality: Left;  . POLYPECTOMY    . TRIGGER FINGER RELEASE  2009   right hand--Dr Burney Gauze  . UPPER GASTROINTESTINAL ENDOSCOPY      Social History:  reports that he quit smoking about 20 years ago. His smoking use included cigarettes. He has a 30.00 pack-year smoking history. He has never used smokeless tobacco. He reports current alcohol use. He reports that he does not use drugs.  Allergies: Allergies  Allergen Reactions  . Penicillins     REACTION: rash as a child    Family History:  Family History  Problem Relation Age of Onset  . COPD Mother   . Charcot-Marie-Tooth disease Mother   . Heart disease Father   . Hypertension Father   . Prostate cancer Father        dx in his 63's  . Cancer - Other Brother   . Lung cancer Brother   . Charcot-Marie-Tooth disease Maternal Grandmother   . Colon cancer Neg Hx   . Esophageal cancer Neg Hx   . Stomach cancer Neg Hx   . Rectal cancer Neg Hx   . Liver cancer Neg Hx   . Pancreatic cancer Neg Hx   . Colon polyps Neg Hx      Current Outpatient Medications:  .  aspirin 81 MG tablet, Take 81 mg by mouth daily. , Disp: , Rfl:  .  DiphenhydrAMINE HCl (BENADRYL  ALLERGY PO), Take 1 tablet by mouth as needed. , Disp: , Rfl:  .  dutasteride (AVODART) 0.5 MG capsule, , Disp: , Rfl:  .  lisinopril (ZESTRIL) 20 MG tablet, TAKE 1 TABLET EVERY DAY, Disp: 90 tablet, Rfl: 1 .  metFORMIN (GLUCOPHAGE) 500 MG tablet, Take 1 tablet (500 mg total) by mouth 2 (two) times daily with a meal., Disp: 180 tablet, Rfl: 3 .  pantoprazole (PROTONIX) 40 MG tablet, TAKE 1 TABLET(40 MG) BY MOUTH DAILY, Disp: 90 tablet, Rfl: 1  Review of Systems:  Constitutional: Denies fever, chills, diaphoresis, appetite change and fatigue.  HEENT: Denies photophobia, eye pain, redness, hearing loss, ear pain, congestion, sore throat, rhinorrhea, sneezing, mouth sores, trouble swallowing, neck pain, neck stiffness  and tinnitus.   Respiratory: Denies SOB, DOE, cough, chest tightness,  and wheezing.   Cardiovascular: Denies chest pain, palpitations and leg swelling.  Gastrointestinal: Denies nausea, vomiting, abdominal pain, diarrhea, constipation, blood in stool and abdominal distention.  Genitourinary: Denies dysuria, urgency, frequency, hematuria, flank pain and difficulty urinating.  Endocrine: Denies: hot or cold intolerance, sweats, changes in hair or nails, polyuria, polydipsia. Musculoskeletal: Denies myalgias, back pain, joint swelling, arthralgias and gait problem.  Skin: Denies pallor, rash and wound.  Neurological: Denies dizziness, seizures, syncope, weakness, light-headedness, numbness and headaches.  Hematological: Denies adenopathy. Easy bruising, personal or family bleeding history  Psychiatric/Behavioral: Denies suicidal ideation, mood changes, confusion, nervousness, sleep disturbance and agitation    Physical Exam: Vitals:   09/11/19 0827  BP: 128/78  Pulse: 73  Temp: 97.7 F (36.5 C)  TempSrc: Temporal  Weight: 280 lb 12.8 oz (127.4 kg)  Height: 6' 1.5" (1.867 m)    Body mass index is 36.54 kg/m.   Constitutional: NAD, calm, comfortable Eyes: PERRL, lids and conjunctivae normal, wears corrective lenses ENMT: Mucous membranes are moist. Tympanic membrane is pearly white, no erythema or bulging. Neck: normal, supple, no masses, no thyromegaly Respiratory: clear to auscultation bilaterally, no wheezing, no crackles. Normal respiratory effort. No accessory muscle use.  Cardiovascular: Regular rate and rhythm, no murmurs / rubs / gallops. No extremity edema. 2+ pedal pulses. No carotid bruits.  Abdomen: no tenderness, no masses palpated. No hepatosplenomegaly. Bowel sounds positive.  Musculoskeletal: no clubbing / cyanosis. No joint deformity upper and lower extremities. Good ROM, no contractures. Normal muscle tone.  Skin: no rashes, lesions, ulcers. No  induration Neurologic: CN 2-12 grossly intact. Sensation intact, DTR normal. Strength 5/5 in all 4.  Psychiatric: Normal judgment and insight. Alert and oriented x 3. Normal mood.   Subsequent Medicare wellness visit   1. Risk factors, based on past  M,S,F -cardiovascular disease risk factors include age, gender, obesity, hypertension, hyperlipidemia, diabetes   2.  Physical activities: Mostly sedentary   3.  Depression/mood:  Stable, not depressed   4.  Hearing:  No perceived issues   5.  ADL's: Independent in all ADLs   6.  Fall risk:  Low fall risk   7.  Home safety: No problems identified   8.  Height weight, and visual acuity: Height and weight as above, visual acuity is 20/15 with each eye independently and together with correction   9.  Counseling:  We have discussed lifestyle modifications   10. Lab orders based on risk factors: Laboratory update will be reviewed   11. Referral :  None today   12. Care plan:  Follow-up with me in 1 year, needs to schedule diabetic eye exam   13. Cognitive assessment:  No cognitive impairment   14. Screening: Patient provided with a written and personalized 5-10 year screening schedule in the AVS.   yes   15. Provider List Update:   PCP, GI, urology  16. Advance Directives: Full code     Office Visit from 04/11/2018 in [place] at [facility] Total Score  1      Fall Risk  09/11/2019 12/13/2018 08/16/2018 04/11/2018 11/10/2015  Falls in the past year? 1 0 1 1 Yes  Number falls in past yr: 1 - 1 1 -  Injury with Fall? 0 - 0 0 -     Impression and Plan:  Encounter for preventive health examination -Advised routine eye and dental care. -He will receive Pneumovax today, he is also due for shingles vaccine, other than this immunizations are up-to-date including Covid x2. -Screening labs today. -Healthy lifestyle has been discussed in detail. -He had a colonoscopy in 2020 and is a 5-year callback due to his history of  colon polyps. -He follows yearly with his urologist who does his PSA checks.  Mixed hyperlipidemia  - Plan: Lipid panel -He has a statin intolerance, he is willing to try a lower dose statin if needed.  Essential hypertension  -Well-controlled, continue lisinopril.  Gastroesophageal reflux disease without esophagitis -Well-controlled on daily PPI therapy.  Obstructive sleep apnea -On nightly CPAP.  Benign prostatic hyperplasia with urinary obstruction -Followed by urology.  Type 2 diabetes mellitus without complication, without long-term current use of insulin ([facility]) -Excellent control with an A1c of 5.9 today, is on Metformin 500 mg daily.  Morbid obesity ([place]) -Discussed healthy lifestyle, including increased physical activity and better food choices to promote weight loss.  Need for prophylactic vaccination against Streptococcus pneumoniae (pneumococcus)  - Plan: Pneumococcal polysaccharide vaccine 23-valent greater than or equal to 2yo subcutaneous/IM    Patient Instructions  -Nice seeing you today!!  -Lab work today; will notify you once results are available.  -Pneumonia vaccine today.  -Schedule your eye exam.  -Consider receiving your shingles vaccine at your pharmacy.  -Schedule follow up in 1 year or sooner as needed.   Preventive Care 67 Years and Older, Male Preventive care refers to lifestyle choices and visits with your health care provider that can promote health and wellness. This includes:  A yearly physical exam. This is also called an annual well check.  Regular dental and eye exams.  Immunizations.  Screening for certain conditions.  Healthy lifestyle choices, such as diet and exercise. What can I expect for my preventive care visit? Physical exam Your health care provider will check:  Height and weight. These may be used to calculate body mass index (BMI), which is a measurement that tells if you are at a healthy weight.  Heart rate  and blood pressure.  Your skin for abnormal spots. Counseling Your health care provider may ask you questions about:  Alcohol, tobacco, and drug use.  Emotional well-being.  Home and relationship well-being.  Sexual activity.  Eating habits.  History of falls.  Memory and ability to understand (cognition).  Work and work Statistician. What immunizations do I need?  Influenza (flu) vaccine  This is recommended every year. Tetanus, diphtheria, and pertussis (Tdap) vaccine  You may need a Td booster every 10 years. Varicella (chickenpox) vaccine  You may need this vaccine if you have not already been vaccinated. Zoster (shingles) vaccine  You may need this after age 33. Pneumococcal conjugate (PCV13) vaccine  One dose is recommended after age  65. Pneumococcal polysaccharide (PPSV23) vaccine  One dose is recommended after age 43. Measles, mumps, and rubella (MMR) vaccine  You may need at least one dose of MMR if you were born in 1957 or later. You may also need a second dose. Meningococcal conjugate (MenACWY) vaccine  You may need this if you have certain conditions. Hepatitis A vaccine  You may need this if you have certain conditions or if you travel or work in places where you may be exposed to hepatitis A. Hepatitis B vaccine  You may need this if you have certain conditions or if you travel or work in places where you may be exposed to hepatitis B. Haemophilus influenzae type b (Hib) vaccine  You may need this if you have certain conditions. You may receive vaccines as individual doses or as more than one vaccine together in one shot (combination vaccines). Talk with your health care provider about the risks and benefits of combination vaccines. What tests do I need? Blood tests  Lipid and cholesterol levels. These may be checked every 5 years, or more frequently depending on your overall health.  Hepatitis C test.  Hepatitis B test. Screening  Lung  cancer screening. You may have this screening every year starting at age 63 if you have a 30-pack-year history of smoking and currently smoke or have quit within the past 15 years.  Colorectal cancer screening. All adults should have this screening starting at age 73 and continuing until age 77. Your health care provider may recommend screening at age 41 if you are at increased risk. You will have tests every 1-10 years, depending on your results and the type of screening test.  Prostate cancer screening. Recommendations will vary depending on your family history and other risks.  Diabetes screening. This is done by checking your blood sugar (glucose) after you have not eaten for a while (fasting). You may have this done every 1-3 years.  Abdominal aortic aneurysm (AAA) screening. You may need this if you are a current or former smoker.  Sexually transmitted disease (STD) testing. Follow these instructions at home: Eating and drinking  Eat a diet that includes fresh fruits and vegetables, whole grains, lean protein, and low-fat dairy products. Limit your intake of foods with high amounts of sugar, saturated fats, and salt.  Take vitamin and mineral supplements as recommended by your health care provider.  Do not drink alcohol if your health care provider tells you not to drink.  If you drink alcohol: ? Limit how much you have to 0-2 drinks a day. ? Be aware of how much alcohol is in your drink. In the [place] one drink equals one 12 oz bottle of beer (355 mL), one 5 oz glass of wine (148 mL), or one 1 oz glass of hard liquor (44 mL). Lifestyle  Take daily care of your teeth and gums.  Stay active. Exercise for at least 30 minutes on 5 or more days each week.  Do not use any products that contain nicotine or tobacco, such as cigarettes, e-cigarettes, and chewing tobacco. If you need help quitting, ask your health care provider.  If you are sexually active, practice safe sex. Use a condom  or other form of protection to prevent STIs (sexually transmitted infections).  Talk with your health care provider about taking a low-dose aspirin or statin. What's next?  Visit your health care provider once a year for a well check visit.  Ask your health care provider how often you  should have your eyes and teeth checked.  Stay up to date on all vaccines. This information is not intended to replace advice given to you by your health care provider. Make sure you discuss any questions you have with your health care provider. Document Revised: 03/16/2018 Document Reviewed: 03/16/2018 Elsevier Patient Education  2020 [place], [place] [facility]

## 2019-09-11 NOTE — Patient Instructions (Signed)
-Nice seeing you today!!  -Lab work today; will notify you once results are available.  -Pneumonia vaccine today.  -Schedule your eye exam.  -Consider receiving your shingles vaccine at your pharmacy.  -Schedule follow up in 1 year or sooner as needed.   Preventive Care 75 Years and Older, Male Preventive care refers to lifestyle choices and visits with your health care provider that can promote health and wellness. This includes:  A yearly physical exam. This is also called an annual well check.  Regular dental and eye exams.  Immunizations.  Screening for certain conditions.  Healthy lifestyle choices, such as diet and exercise. What can I expect for my preventive care visit? Physical exam Your health care provider will check:  Height and weight. These may be used to calculate body mass index (BMI), which is a measurement that tells if you are at a healthy weight.  Heart rate and blood pressure.  Your skin for abnormal spots. Counseling Your health care provider may ask you questions about:  Alcohol, tobacco, and drug use.  Emotional well-being.  Home and relationship well-being.  Sexual activity.  Eating habits.  History of falls.  Memory and ability to understand (cognition).  Work and work Statistician. What immunizations do I need?  Influenza (flu) vaccine  This is recommended every year. Tetanus, diphtheria, and pertussis (Tdap) vaccine  You may need a Td booster every 10 years. Varicella (chickenpox) vaccine  You may need this vaccine if you have not already been vaccinated. Zoster (shingles) vaccine  You may need this after age 75. Pneumococcal conjugate (PCV13) vaccine  One dose is recommended after age 20. Pneumococcal polysaccharide (PPSV23) vaccine  One dose is recommended after age 75. Measles, mumps, and rubella (MMR) vaccine  You may need at least one dose of MMR if you were born in 1957 or later. You may also need a second  dose. Meningococcal conjugate (MenACWY) vaccine  You may need this if you have certain conditions. Hepatitis A vaccine  You may need this if you have certain conditions or if you travel or work in places where you may be exposed to hepatitis A. Hepatitis B vaccine  You may need this if you have certain conditions or if you travel or work in places where you may be exposed to hepatitis B. Haemophilus influenzae type b (Hib) vaccine  You may need this if you have certain conditions. You may receive vaccines as individual doses or as more than one vaccine together in one shot (combination vaccines). Talk with your health care provider about the risks and benefits of combination vaccines. What tests do I need? Blood tests  Lipid and cholesterol levels. These may be checked every 5 years, or more frequently depending on your overall health.  Hepatitis C test.  Hepatitis B test. Screening  Lung cancer screening. You may have this screening every year starting at age 44 if you have a 30-pack-year history of smoking and currently smoke or have quit within the past 15 years.  Colorectal cancer screening. All adults should have this screening starting at age 42 and continuing until age 81. Your health care provider may recommend screening at age 61 if you are at increased risk. You will have tests every 1-10 years, depending on your results and the type of screening test.  Prostate cancer screening. Recommendations will vary depending on your family history and other risks.  Diabetes screening. This is done by checking your blood sugar (glucose) after you have not eaten  for a while (fasting). You may have this done every 1-3 years.  Abdominal aortic aneurysm (AAA) screening. You may need this if you are a current or former smoker.  Sexually transmitted disease (STD) testing. Follow these instructions at home: Eating and drinking  Eat a diet that includes fresh fruits and vegetables, whole  grains, lean protein, and low-fat dairy products. Limit your intake of foods with high amounts of sugar, saturated fats, and salt.  Take vitamin and mineral supplements as recommended by your health care provider.  Do not drink alcohol if your health care provider tells you not to drink.  If you drink alcohol: ? Limit how much you have to 0-2 drinks a day. ? Be aware of how much alcohol is in your drink. In the [place] one drink equals one 12 oz bottle of beer (355 mL), one 5 oz glass of wine (148 mL), or one 1 oz glass of hard liquor (44 mL). Lifestyle  Take daily care of your teeth and gums.  Stay active. Exercise for at least 30 minutes on 5 or more days each week.  Do not use any products that contain nicotine or tobacco, such as cigarettes, e-cigarettes, and chewing tobacco. If you need help quitting, ask your health care provider.  If you are sexually active, practice safe sex. Use a condom or other form of protection to prevent STIs (sexually transmitted infections).  Talk with your health care provider about taking a low-dose aspirin or statin. What's next?  Visit your health care provider once a year for a well check visit.  Ask your health care provider how often you should have your eyes and teeth checked.  Stay up to date on all vaccines. This information is not intended to replace advice given to you by your health care provider. Make sure you discuss any questions you have with your health care provider. Document Revised: 03/16/2018 Document Reviewed: 03/16/2018 Elsevier Patient Education  2020 [employer].

## 2019-09-13 NOTE — Progress Notes (Signed)
Subjective: Ralph Garrett presents today at risk foot care with history of diabetic neuropathy and painful callus(es) plantar aspect of both feet. Aggravating factors include weightbearing with and without shoe gear. Pain is relieved with periodic professional debridement.   He states he is in the process of moving into [facility] in [place].  We also discussed him feeling more unsteady on his feet due to the Charcot Marie Tooth Disease. We discussed possibly obtaining diabetic shoes with custom offloaded insoles for his chronic callosities. He would like to entertain this after his move.  Ralph Garrett, Ralph Halsted, MD is patient's PCP. Next visit 09/11/2019.  Past Medical History:  Diagnosis Date  . BPH (benign prostatic hyperplasia)   . Coronary artery disease   . Diabetes mellitus without complication ([place])   . Diverticulosis of colon   . GERD (gastroesophageal reflux disease)   . History of bacterial meningitis 1987  . Hx of colonic polyps   . Hypercholesteremia    no per pt  . Hypertension   . Increased prostate specific antigen (PSA) velocity   . Meniscus tear    left knee  . Obesity   . Peroneal muscular atrophy   . Sleep apnea    wears cpap     Current Outpatient Medications on File Prior to Visit  Medication Sig Dispense Refill  . aspirin 81 MG tablet Take 81 mg by mouth daily.     . DiphenhydrAMINE HCl (BENADRYL ALLERGY PO) Take 1 tablet by mouth as needed.     . dutasteride (AVODART) 0.5 MG capsule     . lisinopril (ZESTRIL) 20 MG tablet TAKE 1 TABLET EVERY DAY 90 tablet 1  . metFORMIN (GLUCOPHAGE) 500 MG tablet Take 1 tablet (500 mg total) by mouth 2 (two) times daily with a meal. 180 tablet 3  . pantoprazole (PROTONIX) 40 MG tablet TAKE 1 TABLET(40 MG) BY MOUTH DAILY 90 tablet 1   No current facility-administered medications on file prior to visit.     Allergies  Allergen Reactions  . Penicillins     REACTION: rash as a child    Objective: Ralph R  Garrett is a pleasant 74 y.o. y.o. Patient Race: White or Caucasian [1]  male in NAD. AAO x 3.  There were no vitals filed for this visit.  Vascular Examination: Neurovascular status unchanged b/l lower extremities. Capillary fill time to digits <3 seconds b/l lower extremities. Palpable DP pulses b/l. Palpable PT pulses b/l. Pedal hair present b/l. Skin temperature gradient within normal limits b/l. No pain with calf compression b/l.  Dermatological Examination: Pedal skin with normal turgor, texture and tone bilaterally. No open wounds bilaterally. No interdigital macerations bilaterally. Toenails 1-5 b/l elongated, discolored, dystrophic, thickened, crumbly with subungual debris and tenderness to dorsal palpation. Hyperkeratotic lesion(s) submet head 1 left foot, submet head 1 right foot, submet head 5 left foot and submet head 5 right foot.  No erythema, no edema, no drainage, no flocculence.  Musculoskeletal: Normal muscle strength 5/5 to all lower extremity muscle groups bilaterally. No pain crepitus or joint limitation noted with ROM b/l. Hammertoes noted to the 1-5 bilaterally. Pes planus deformity noted b/l.   Neurological Examination: Protective sensation diminished with 10g monofilament b/l. Vibratory sensation intact b/l.  Assessment: 1. Callus   2. Charcot-Marie-Tooth disease   3. Pes cavus   4. Diabetic peripheral neuropathy associated with type 2 diabetes mellitus ([place])   Plan: -Examined patient. He is concerned about his gait instability and predisposed to falls.  -  Callus(es) submet head 1 left foot, submet head 1 right foot, submet head 5 left foot and submet head 5 right foot pared utilizing sterile scalpel blade without complication or incident. Total number debrided =4. -Patient to continue soft, supportive shoe gear daily. We are planning for diabetic shoes this year after his move to [facility]. He will need custom offloading of insoles for his  calluses submet heads 1, 5 b/l.  Will also consult with Pedorthist regarding bracing for gait instability.  -Patient to report any pedal injuries to medical professional immediately. -Patient/POA to call should there be question/concern in the interim.  Return in about 3 months (around 12/11/2019) for diabetic callus trim.  Marzetta Board, DPM

## 2019-09-25 IMAGING — DX DG CHEST 2V
2 series · 2 of 2 positions shown · non-contrast
Comparison: Chest x-ray of November 04, 2015

CLINICAL DATA: No current complaints. History of coronary artery
disease, hypertension, former smoker.

EXAM:
CHEST - 2 VIEW

[chest pa]
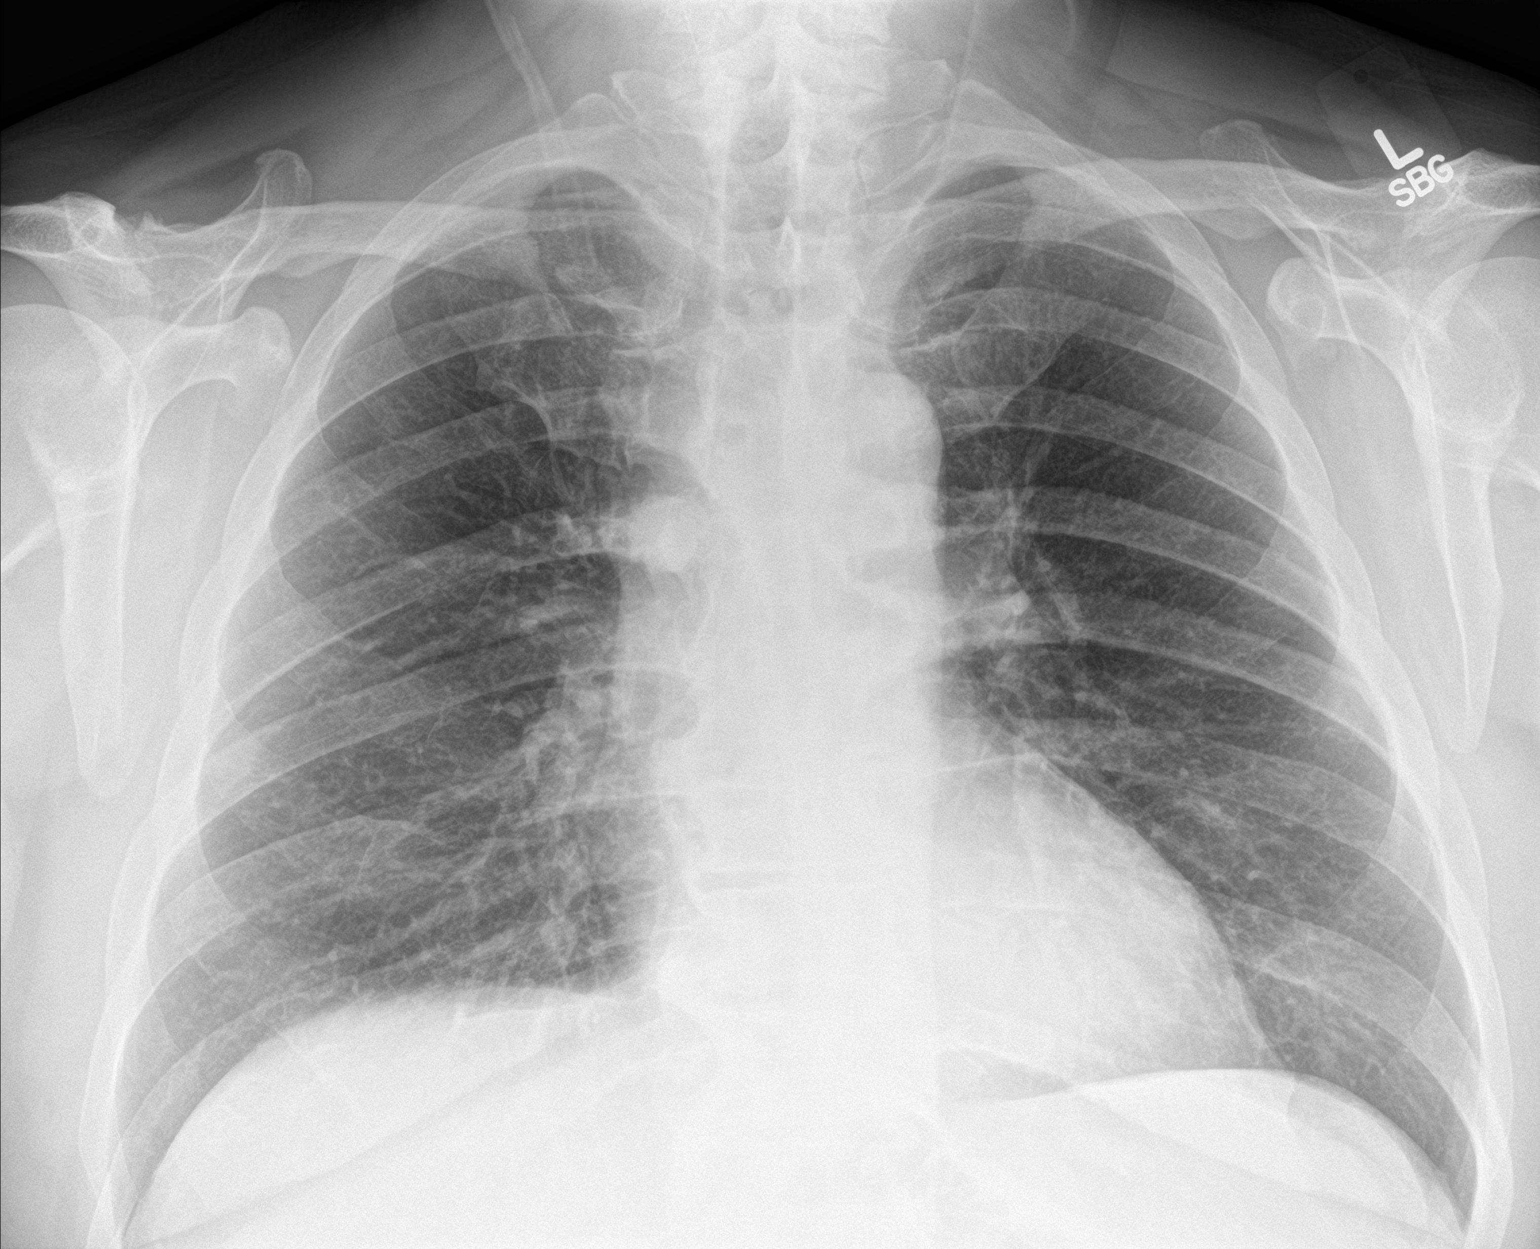

[chest lat]
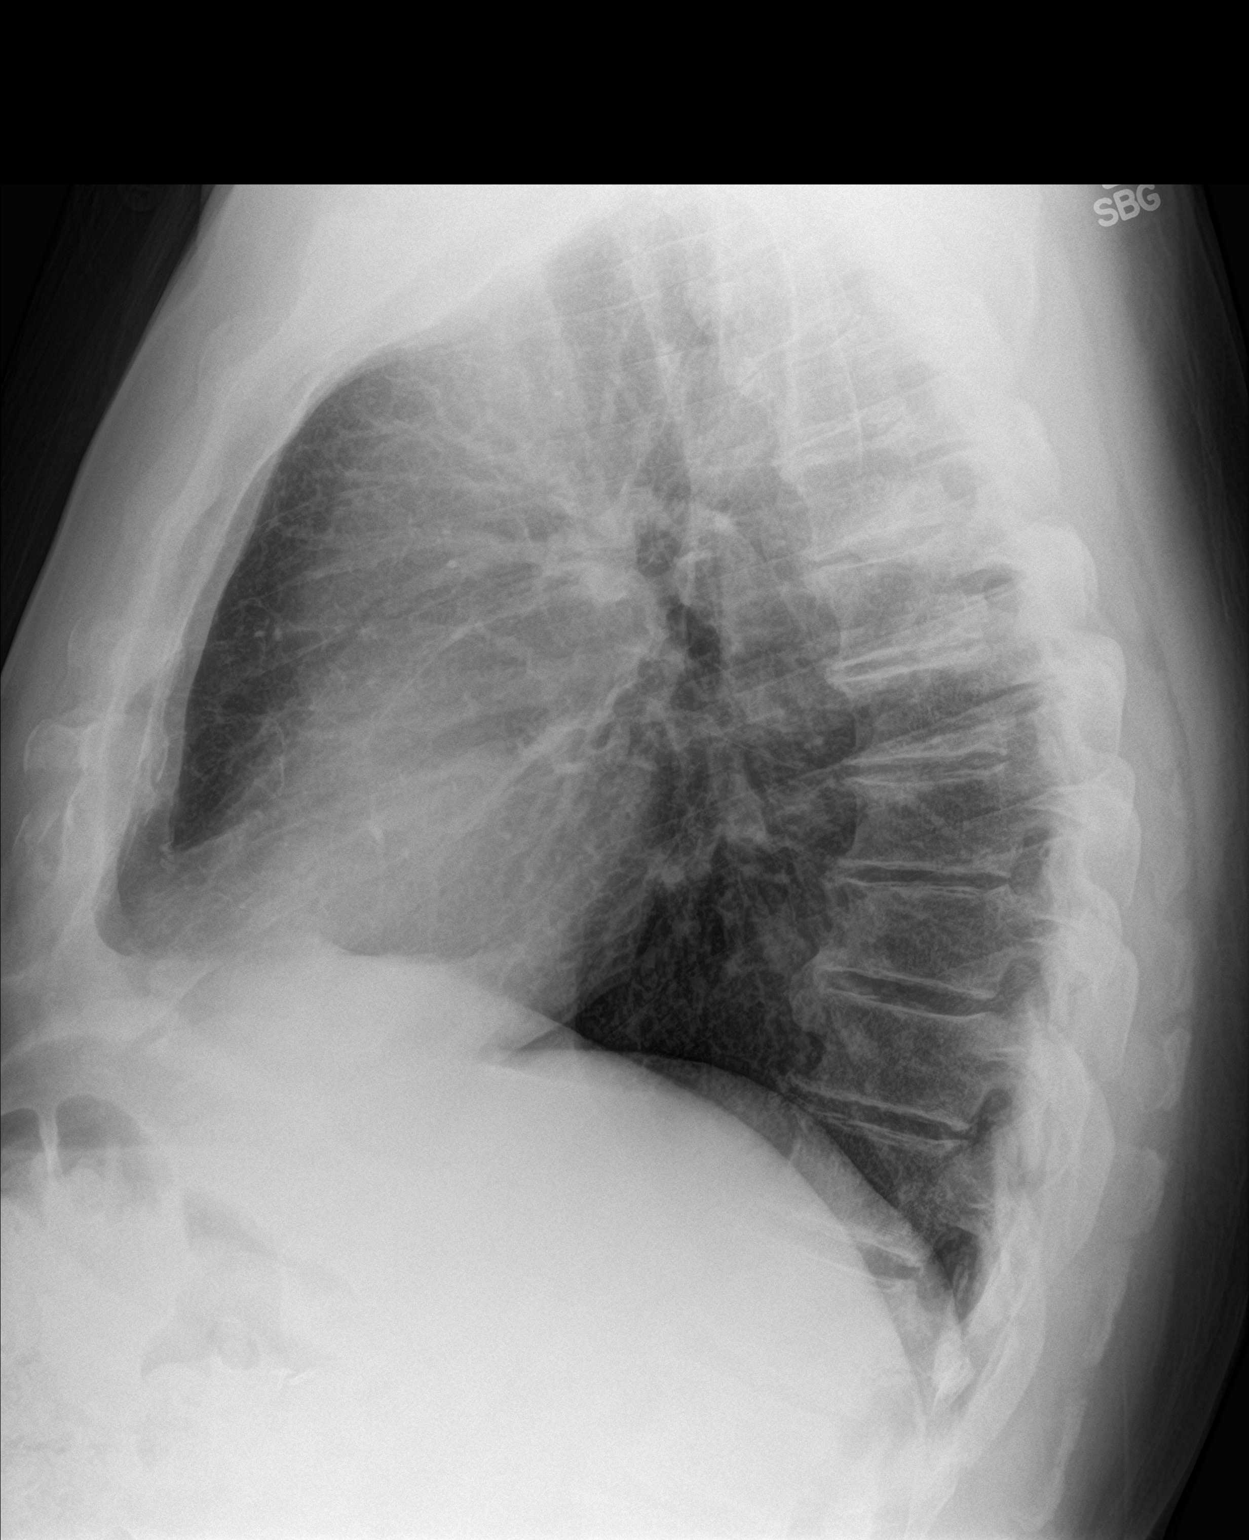

[2 of 2 positions shown; findings below may reference images not displayed]

FINDINGS: The lungs are adequately inflated. The interstitial markings are
mildly prominent. There is no alveolar infiltrate or pleural
effusion. The heart and pulmonary vascularity are normal. There are
prominent right lateral osteophytes at T5-6 and to a lesser extent
T6-7. The vertebral bodies are preserved in height.
IMPRESSION: There is no active cardiopulmonary disease. Mild chronic
bronchitic-smoking related changes, stable.

## 2019-10-04 ENCOUNTER — Other Ambulatory Visit: Payer: Self-pay | Admitting: Internal Medicine

## 2019-11-09 ENCOUNTER — Other Ambulatory Visit: Payer: Self-pay | Admitting: Internal Medicine

## 2019-11-09 DIAGNOSIS — E119 Type 2 diabetes mellitus without complications: Secondary | ICD-10-CM

## 2019-12-14 ENCOUNTER — Telehealth: Payer: Self-pay | Admitting: Internal Medicine

## 2019-12-14 DIAGNOSIS — G4733 Obstructive sleep apnea (adult) (pediatric): Secondary | ICD-10-CM

## 2019-12-14 NOTE — Telephone Encounter (Signed)
Pt Cpap machine has broken and he needs another written prescription for it.  Please call pt at [phone]

## 2019-12-18 NOTE — Addendum Note (Signed)
Addended by: Westley Hummer B on: 12/18/2019 12:06 PM   Modules accepted: Orders

## 2019-12-18 NOTE — Telephone Encounter (Signed)
Referral placed for sleep study.

## 2019-12-18 NOTE — Telephone Encounter (Signed)
Pt called and said he would like to be referred to Dr. Charlton Haws.

## 2019-12-18 NOTE — Telephone Encounter (Signed)
Referral sent 

## 2020-01-01 ENCOUNTER — Telehealth: Payer: Self-pay | Admitting: General Practice

## 2020-01-22 DIAGNOSIS — Z23 Encounter for immunization: Secondary | ICD-10-CM | POA: Diagnosis not present

## 2020-02-12 DIAGNOSIS — E119 Type 2 diabetes mellitus without complications: Secondary | ICD-10-CM | POA: Diagnosis not present

## 2020-02-12 DIAGNOSIS — H25813 Combined forms of age-related cataract, bilateral: Secondary | ICD-10-CM | POA: Diagnosis not present

## 2020-02-12 DIAGNOSIS — I10 Essential (primary) hypertension: Secondary | ICD-10-CM | POA: Diagnosis not present

## 2020-02-12 DIAGNOSIS — H52223 Regular astigmatism, bilateral: Secondary | ICD-10-CM | POA: Diagnosis not present

## 2020-02-12 DIAGNOSIS — H524 Presbyopia: Secondary | ICD-10-CM | POA: Diagnosis not present

## 2020-02-12 DIAGNOSIS — H35033 Hypertensive retinopathy, bilateral: Secondary | ICD-10-CM | POA: Diagnosis not present

## 2020-02-12 DIAGNOSIS — H5203 Hypermetropia, bilateral: Secondary | ICD-10-CM | POA: Diagnosis not present

## 2020-02-12 LAB — HM DIABETES EYE EXAM

## 2020-02-26 DIAGNOSIS — R972 Elevated prostate specific antigen [PSA]: Secondary | ICD-10-CM | POA: Diagnosis not present

## 2020-02-26 LAB — PSA: PSA: 2.15

## 2020-03-03 DIAGNOSIS — R3912 Poor urinary stream: Secondary | ICD-10-CM | POA: Diagnosis not present

## 2020-03-03 DIAGNOSIS — R972 Elevated prostate specific antigen [PSA]: Secondary | ICD-10-CM | POA: Diagnosis not present

## 2020-03-03 DIAGNOSIS — N401 Enlarged prostate with lower urinary tract symptoms: Secondary | ICD-10-CM | POA: Diagnosis not present

## 2020-03-11 ENCOUNTER — Ambulatory Visit: Payer: Medicare Other | Admitting: Podiatry

## 2020-03-11 ENCOUNTER — Other Ambulatory Visit: Payer: Self-pay | Admitting: Internal Medicine

## 2020-03-19 ENCOUNTER — Other Ambulatory Visit: Payer: Self-pay

## 2020-03-19 ENCOUNTER — Ambulatory Visit (INDEPENDENT_AMBULATORY_CARE_PROVIDER_SITE_OTHER): Payer: Medicare Other | Admitting: Podiatry

## 2020-03-19 ENCOUNTER — Encounter: Payer: Self-pay | Admitting: Podiatry

## 2020-03-19 DIAGNOSIS — E1142 Type 2 diabetes mellitus with diabetic polyneuropathy: Secondary | ICD-10-CM

## 2020-03-19 DIAGNOSIS — Q667 Congenital pes cavus, unspecified foot: Secondary | ICD-10-CM

## 2020-03-19 DIAGNOSIS — G600 Hereditary motor and sensory neuropathy: Secondary | ICD-10-CM

## 2020-03-19 NOTE — Progress Notes (Signed)
Mr. 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  He states that he has such poor balance at this point he really feels that he needs to see about getting a first set of braces.  He sees a doctor occasionally to have his calluses debrided other than that there are no significant issues.  Objective: Vital signs are stable alert oriented x3 pulses are palpable.  Typical Charlet Charcot-Marie-Tooth type foot high arch contracted toes prominent plantar flexed metatarsals.  Some reactive hyperkeratoses.  He does have weak peroneal tendons in his dorsiflexion is limited to only to 90 degrees knee extended.  Assessment Charcot-Marie-Tooth with balance problems.  Plan: Discussed etiology pathology conservative surgical therapies at this point he would like to try some upright braces we will send him to [facility] for brace choices.

## 2020-04-28 ENCOUNTER — Telehealth: Payer: Self-pay | Admitting: Internal Medicine

## 2020-04-28 NOTE — Telephone Encounter (Signed)
Pt scheduled for mychart video visit with Dr. Norman Herrlich tomorrow at 9:30am for epigastric pain/bloating/belching. Pt aware of appt.

## 2020-04-28 NOTE — Telephone Encounter (Signed)
Inbound call from patient wanting to schedule a virtual appt with Dr. Hilarie Fredrickson, but informed patient he has no availability until April.  Requesting a call back from nurse please.  States he has been very gassy which causes abdominal pain.

## 2020-04-29 ENCOUNTER — Telehealth: Payer: Self-pay | Admitting: Internal Medicine

## 2020-04-29 ENCOUNTER — Encounter: Payer: Self-pay | Admitting: Internal Medicine

## 2020-04-29 ENCOUNTER — Telehealth (INDEPENDENT_AMBULATORY_CARE_PROVIDER_SITE_OTHER): Payer: Medicare Other | Admitting: Internal Medicine

## 2020-04-29 VITALS — Ht 73.0 in | Wt 275.0 lb

## 2020-04-29 DIAGNOSIS — R1011 Right upper quadrant pain: Secondary | ICD-10-CM

## 2020-04-29 DIAGNOSIS — R142 Eructation: Secondary | ICD-10-CM

## 2020-04-29 MED ORDER — ESOMEPRAZOLE MAGNESIUM 40 MG PO CPDR: 40.0000 mg | DELAYED_RELEASE_CAPSULE | Freq: Two times a day (BID) | ORAL | 3 refills | Status: DC

## 2020-04-29 NOTE — Telephone Encounter (Signed)
Patient is currently at [facility] and is waiting for script the phone number is [phone].

## 2020-04-29 NOTE — Addendum Note (Signed)
Addended by: Audrea Muscat on: 04/29/2020 03:24 PM   Modules accepted: Orders

## 2020-04-29 NOTE — Telephone Encounter (Signed)
Nexium sent to [facility] in [place]

## 2020-04-29 NOTE — Progress Notes (Signed)
Subjective:    Patient ID: Ralph Garrett, male    DOB: 04-Mar-1946, 75 y.o.   MRN: [id number]  This service was provided via telemedicine.  My chart video visit The patient was located at home The provider was located in provider's GI office. The patient did consent to this telephone visit and is aware of possible charges through their insurance for this visit.   The persons participating in this telemedicine service were the patient and I. Time spent on call:  15 min   HPI Ralph Garrett is a 75 year old male known to me with a history of multiple adenomatous colon polyps, colonic diverticulosis but also with a history of Charcot-Marie-Tooth disease, hypertension, hyperlipidemia, diabetes, sleep apnea who is seen to evaluate right upper quadrant abdominal discomfort with belching and bloating.  He has seen virtually by my chart video visit today from his home in [place].  He reports that over the last month or so he has developed right upper quadrant abdominal discomfort.  This is worse in the morning or when he wakes up from taking a nap.  It is often improved significantly by belching.  He reports when he wakes up he will belch as many as 20 times and the pain considerably improved.  It is never completely gone but certainly minimal throughout most of the day.  He has not had a change in appetite nor nausea or vomiting.  He is not feeling heartburn.  His bowel movements have been normal.  There is no lower abdominal pain.  He started over-the-counter Nexium 20 mg about 1 week ago and may feel the slightest improvement.  He does not have early satiety.  Denies dysphagia.  He recalls similar symptoms about 15 years ago evaluated by Dr. Sharlett Iles at that time.  He underwent cholecystectomy but also upper endoscopy.  He reports eventually Nexium, used at that time, resolved his symptoms entirely. (By chart review this was 2005).  He also notes that the last 6 to 8 weeks have been extremely  stressful.  His wife became ill with sepsis related to kidney stone and was hospitalized including for over a week in the ICU.  She has recovered and is now home.  Review of Systems As per HPI, otherwise negative  Current Medications, Allergies, Past Medical History, Past Surgical History, Family History and Social History were reviewed in [employer] record.     Objective:   Physical Exam Ht 6\' 1"  (1.854 m)   Wt 275 lb (124.7 kg)   BMI 36.28 kg/m  Gen: Well-appearing by video visit He points to the right upper quadrant when pointing out his area of discomfort  His last colonoscopy was performed by me on 12/27/2018: A single rectal polyp, 5 mm, was removed and found to be a lymphoid aggregate.  There was severe diverticulosis from the transverse colon to sigmoid.  Prior to this he had a colonoscopy with greater than 10 adenomas removed in 2019.     Assessment & Plan:  75 year old male known to me with a history of multiple adenomatous colon polyps, colonic diverticulosis but also with a history of Charcot-Marie-Tooth disease, hypertension, hyperlipidemia, diabetes, sleep apnea who is seen to evaluate right upper quadrant abdominal discomfort with belching and bloating.   1.  Right upper quadrant abdominal pain/bloating and belching --symptoms certainly could be acid peptic and exacerbated by recent stress.  He is status post cholecystectomy.  There are no overt alarm symptoms at this time to warrant emergent  upper endoscopy.  He has had possible partial response to low-dose PPI.  I recommended the following: --Nexium 40 mg twice daily AC x4 to 6 weeks --Repeat virtual visit with me in early March.  If no better consider cross-sectional imaging versus upper endoscopy  2.  History of multiple adenomatous colon polyps --surveillance colonoscopy recommended in September 2023

## 2020-04-29 NOTE — Telephone Encounter (Signed)
Pt is requesting a call back regarding his prescription.

## 2020-06-09 DIAGNOSIS — L570 Actinic keratosis: Secondary | ICD-10-CM | POA: Diagnosis not present

## 2020-06-09 DIAGNOSIS — L98499 Non-pressure chronic ulcer of skin of other sites with unspecified severity: Secondary | ICD-10-CM | POA: Diagnosis not present

## 2020-06-09 DIAGNOSIS — L821 Other seborrheic keratosis: Secondary | ICD-10-CM | POA: Diagnosis not present

## 2020-06-09 DIAGNOSIS — L820 Inflamed seborrheic keratosis: Secondary | ICD-10-CM | POA: Diagnosis not present

## 2020-06-09 DIAGNOSIS — D044 Carcinoma in situ of skin of scalp and neck: Secondary | ICD-10-CM | POA: Diagnosis not present

## 2020-06-09 DIAGNOSIS — D485 Neoplasm of uncertain behavior of skin: Secondary | ICD-10-CM | POA: Diagnosis not present

## 2020-06-13 DIAGNOSIS — N401 Enlarged prostate with lower urinary tract symptoms: Secondary | ICD-10-CM | POA: Diagnosis not present

## 2020-06-13 DIAGNOSIS — R338 Other retention of urine: Secondary | ICD-10-CM | POA: Diagnosis not present

## 2020-06-25 DIAGNOSIS — R338 Other retention of urine: Secondary | ICD-10-CM | POA: Diagnosis not present

## 2020-06-25 DIAGNOSIS — N401 Enlarged prostate with lower urinary tract symptoms: Secondary | ICD-10-CM | POA: Diagnosis not present

## 2020-07-09 DIAGNOSIS — R8279 Other abnormal findings on microbiological examination of urine: Secondary | ICD-10-CM | POA: Diagnosis not present

## 2020-07-09 DIAGNOSIS — N401 Enlarged prostate with lower urinary tract symptoms: Secondary | ICD-10-CM | POA: Diagnosis not present

## 2020-07-09 DIAGNOSIS — N3000 Acute cystitis without hematuria: Secondary | ICD-10-CM | POA: Diagnosis not present

## 2020-07-11 ENCOUNTER — Encounter: Payer: Self-pay | Admitting: Internal Medicine

## 2020-09-03 ENCOUNTER — Other Ambulatory Visit: Payer: Self-pay | Admitting: Internal Medicine

## 2020-09-05 DIAGNOSIS — Z23 Encounter for immunization: Secondary | ICD-10-CM | POA: Diagnosis not present

## 2020-10-27 ENCOUNTER — Encounter: Payer: Self-pay | Admitting: Emergency Medicine

## 2020-10-27 ENCOUNTER — Other Ambulatory Visit: Payer: Self-pay

## 2020-10-27 ENCOUNTER — Ambulatory Visit
Admission: EM | Admit: 2020-10-27 | Discharge: 2020-10-27 | Disposition: A | Discharge status: Home / Self Care | Payer: Medicare Other | Attending: Emergency Medicine | Admitting: Emergency Medicine

## 2020-10-27 DIAGNOSIS — T7840XA Allergy, unspecified, initial encounter: Secondary | ICD-10-CM | POA: Diagnosis not present

## 2020-10-27 DIAGNOSIS — L309 Dermatitis, unspecified: Secondary | ICD-10-CM

## 2020-10-27 MED ORDER — HYDROCORTISONE 2.5 % EX OINT: TOPICAL_OINTMENT | Freq: Two times a day (BID) | CUTANEOUS | 0 refills | Status: AC

## 2020-10-27 MED ORDER — PREDNISONE 10 MG (21) PO TBPK: ORAL_TABLET | ORAL | 0 refills | Status: DC

## 2020-10-27 NOTE — Discharge Instructions (Addendum)
Discontinue Benadryl.  Take Claritin or Zyrtec.  Cool compresses.  Try the hydrocortisone ointment under the eyes, your left ear and on your neck.  Discontinue CPAP for 2 or 3 days if you are not getting better in several days, you can try the prednisone taper.  It will make your sugars go up.

## 2020-10-27 NOTE — ED Triage Notes (Signed)
PT wears a CPAP machine. He went out of town this weekend and took a new mask with him. He now has a rash where the mask touched his face. He has tried oral benadryl and benadryl cream

## 2020-10-27 NOTE — ED Provider Notes (Signed)
HPI  SUBJECTIVE:  Ralph Garrett is a 75 y.o. male who presents with intense irritation/itching underneath his eyes, around his ears, behind his ears and over his neck starting 5 days ago after wearing a new CPAP mask with a strap.  He changed back to the old one last night and states that he got worse this morning.  He reports painless infraorbital and ear swelling.  Ears erythematous, with increased temperature.  No new lotions, soaps, detergents, creams some sunscreens.  No angioedema, shortness of breath, hives, itching elsewhere.  He tried 25 mg of Benadryl and topical Benadryl without improvement in his symptoms.  Symptoms are worse when he wears his CPAP mask.  He has a past medical history of OSA on CPAP, diabetes on metformin, hypertension on lisinopril, BPH.  No history of chronic kidney disease, anaphylaxis.  [id number] Everardo Beals, MD   Past Medical History:  Diagnosis Date   BPH (benign prostatic hyperplasia)    Coronary artery disease    Diabetes mellitus without complication ([facility])    Diverticulosis of colon    GERD (gastroesophageal reflux disease)    History of bacterial meningitis 1987   Hx of colonic polyps    Hypercholesteremia    no per pt   Hypertension    Increased prostate specific antigen (PSA) velocity    Meniscus tear    left knee   Obesity    Peroneal muscular atrophy    Sleep apnea    wears cpap    Past Surgical History:  Procedure Laterality Date   CHOLECYSTECTOMY     COLONOSCOPY  02/2004   Dr.Streck   KNEE ARTHROSCOPY Left 04/17/2014   Procedure: LEFT KNEE ARTHROSCOPY WITH MEDIAL MENISCAL DEBRIDEMENT  ;  Surgeon: Gearlean Alf, MD;  Location: [facility];  Service: Orthopedics;  Laterality: Left;   POLYPECTOMY     TRIGGER FINGER RELEASE  2009   right hand--Dr Burney Gauze   UPPER GASTROINTESTINAL ENDOSCOPY      Family History  Problem Relation Age of Onset   COPD Mother    Charcot-Marie-Tooth disease Mother    Heart disease Father     Hypertension Father    Prostate cancer Father        dx in his 82's   Cancer - Other Brother    Lung cancer Brother    Charcot-Marie-Tooth disease Maternal Grandmother    Colon cancer Neg Hx    Esophageal cancer Neg Hx    Stomach cancer Neg Hx    Rectal cancer Neg Hx    Liver cancer Neg Hx    Pancreatic cancer Neg Hx    Colon polyps Neg Hx     Social History   Tobacco Use   Smoking status: Former    Packs/day: 1.00    Years: 30.00    Pack years: 30.00    Types: Cigarettes    Quit date: 04/06/1999    Years since quitting: 21.5   Smokeless tobacco: Never  Vaping Use   Vaping Use: Never used  Substance Use Topics   Alcohol use: Yes    Comment: social   Drug use: No    No current facility-administered medications for this encounter.  Current Outpatient Medications:    hydrocortisone 2.5 % ointment, Apply topically 2 (two) times daily for 14 days., Disp: 30 g, Rfl: 0   predniSONE (STERAPRED UNI-PAK 21 TAB) 10 MG (21) TBPK tablet, Dispense one 6 day pack. Take as directed with food., Disp: 21 tablet, Rfl: 0  aspirin 81 MG tablet, Take 81 mg by mouth daily. , Disp: , Rfl:    dutasteride (AVODART) 0.5 MG capsule, , Disp: , Rfl:    esomeprazole (NEXIUM) 40 MG capsule, Take 1 capsule (40 mg total) by mouth 2 (two) times daily before a meal., Disp: 60 capsule, Rfl: 3   lisinopril (ZESTRIL) 20 MG tablet, TAKE ONE TABLET BY MOUTH DAILY, Disp: 90 tablet, Rfl: 0   metFORMIN (GLUCOPHAGE) 500 MG tablet, TAKE ONE TABLET BY MOUTH TWICE A DAY WITH A MEAL, Disp: 180 tablet, Rfl: 3  Allergies  Allergen Reactions   Penicillins     REACTION: rash as a child     ROS  As noted in HPI.   Physical Exam  BP (!) 165/80   Pulse 76   Temp 98 F (36.7 C) (Oral)   Resp 16   SpO2 97%   Constitutional: Well developed, well nourished, no acute distress Eyes:  EOMI, conjunctiva normal bilaterally.  PERRLA.  Bilateral nontender infraorbital edema without erythema worse on the  left   HENT: Normocephalic, atraumatic,mucus membranes moist Respiratory: Normal inspiratory effort, lungs clear bilaterally Cardiovascular: Normal rate GI: nondistended skin: Erythematous, swollen left ear.  Erythema behind both ears.   Erythematous, papular rash posterior neck.    Musculoskeletal: no deformities Neurologic: Alert & oriented x 3, no focal neuro deficits Psychiatric: Speech and behavior appropriate   ED Course   Medications - No data to display  No orders of the defined types were placed in this encounter.   No results found for this or any previous visit (from the past 24 hour(s)). No results found.  ED Clinical Impression  1. Allergic reaction, initial encounter   2. Dermatitis      ED Assessment/Plan  Patient with allergic reaction/dermatitis most likely to something in the new mask and strap.  We will send home with Claritin or Zyrtec, hydrocortisone ointment for the face, left ear and neck, wait-and-see 6-day prednisone taper if not getting any better after several days, discontinue CPAP for several days.  Follow-up with PMD as needed.  ER return precautions given.  Discussed MDM, treatment plan, and plan for follow-up with patient. Discussed sn/sx that should prompt return to the ED. patient agrees with plan.   Meds ordered this encounter  Medications   hydrocortisone 2.5 % ointment    Sig: Apply topically 2 (two) times daily for 14 days.    Dispense:  30 g    Refill:  0   predniSONE (STERAPRED UNI-PAK 21 TAB) 10 MG (21) TBPK tablet    Sig: Dispense one 6 day pack. Take as directed with food.    Dispense:  21 tablet    Refill:  0      *This clinic note was created using [employer]. Therefore, there may be occasional mistakes despite careful proofreading.  ?    Melynda Ripple, MD 10/27/20 [phone]

## 2020-11-05 ENCOUNTER — Ambulatory Visit (INDEPENDENT_AMBULATORY_CARE_PROVIDER_SITE_OTHER): Payer: Medicare Other | Admitting: Podiatry

## 2020-11-05 ENCOUNTER — Encounter: Payer: Self-pay | Admitting: Podiatry

## 2020-11-05 ENCOUNTER — Other Ambulatory Visit: Payer: Self-pay

## 2020-11-05 DIAGNOSIS — G600 Hereditary motor and sensory neuropathy: Secondary | ICD-10-CM

## 2020-11-05 DIAGNOSIS — D2371 Other benign neoplasm of skin of right lower limb, including hip: Secondary | ICD-10-CM | POA: Diagnosis not present

## 2020-11-05 DIAGNOSIS — E1142 Type 2 diabetes mellitus with diabetic polyneuropathy: Secondary | ICD-10-CM

## 2020-11-05 DIAGNOSIS — D2372 Other benign neoplasm of skin of left lower limb, including hip: Secondary | ICD-10-CM | POA: Diagnosis not present

## 2020-11-05 DIAGNOSIS — Q667 Congenital pes cavus, unspecified foot: Secondary | ICD-10-CM

## 2020-11-05 NOTE — Progress Notes (Signed)
He presents today for follow-up of his Charcot-Marie-Tooth and his hammertoe deformities with calluses.  Has recently picked up his upright braces for his dropfoot at [place] states that they are not very comfortable.  Has a callus that is really being bothered by them subfirst left.  Objective: Vital signs are stable alert oriented x3 hammertoe deformities noted bilateral resulting in reactive hyperkeratosis of the first left.  Assessment: Debrided all reactive hyperkeratosis for him placed padding in his shoe and demonstrated for him how to do that.  Plan: Follow-up with him in 6 months for checkup.

## 2020-11-23 ENCOUNTER — Other Ambulatory Visit: Payer: Self-pay | Admitting: Internal Medicine

## 2020-12-20 ENCOUNTER — Other Ambulatory Visit: Payer: Self-pay | Admitting: Internal Medicine

## 2020-12-20 DIAGNOSIS — E119 Type 2 diabetes mellitus without complications: Secondary | ICD-10-CM

## 2020-12-29 DIAGNOSIS — Z23 Encounter for immunization: Secondary | ICD-10-CM | POA: Diagnosis not present

## 2021-01-13 ENCOUNTER — Ambulatory Visit: Payer: Medicare Other | Admitting: Nurse Practitioner

## 2021-01-13 ENCOUNTER — Other Ambulatory Visit: Payer: Self-pay

## 2021-01-13 VITALS — BP 130/80 | HR 52 | Temp 98.6°F | Ht 73.0 in | Wt 276.0 lb

## 2021-01-13 DIAGNOSIS — J019 Acute sinusitis, unspecified: Secondary | ICD-10-CM

## 2021-01-13 MED ORDER — AZITHROMYCIN 250 MG PO TABS: ORAL_TABLET | ORAL | 0 refills | Status: AC

## 2021-01-13 MED ORDER — BACTROBAN NASAL 2 % NA OINT: 1.0000 "application " | TOPICAL_OINTMENT | Freq: Two times a day (BID) | NASAL | 0 refills | Status: DC

## 2021-01-13 NOTE — Progress Notes (Signed)
Careteam: Patient Care Team: [name], Rayford Halsted, MD as PCP - General (Internal Medicine)  Advanced Directive information    Allergies  Allergen Reactions   Penicillins     REACTION: rash as a child    Chief Complaint  Patient presents with   Acute Visit    Patient has crusty nose inside nose, has been using saline nasal spray, but seems to be getting worse. Thinks he may have infection.Patient has soreness in right nostril. Patient sleeps with CPAP machine.     HPI: Patient is a 75 y.o. male seen in today at the [facility] for evaluation of right nare. Reports 2 weeks ago his nose started bothering him. Taking zyrtec and saline spray. Started picking at his nose and now it is sore and going into his sinuses.  Reports he also is having some tenderness in the jaw line on the right side.  Reports scratchy throat over the last few days.  Tried the doc in the box but there was an hour wait     Review of Systems:  Review of Systems  Constitutional:  Negative for chills, fever, malaise/fatigue and weight loss.  HENT:  Positive for congestion and sinus pain. Negative for nosebleeds, sore throat and tinnitus.   Respiratory:  Negative for cough, sputum production and shortness of breath.   Cardiovascular:  Negative for chest pain, palpitations and leg swelling.  Gastrointestinal:  Negative for abdominal pain, constipation, diarrhea and heartburn.  Genitourinary:  Negative for dysuria, frequency and urgency.  Musculoskeletal:  Negative for back pain, falls, joint pain and myalgias.  Skin: Negative.   Neurological:  Negative for dizziness and headaches.  Endo/Heme/Allergies:  Negative for environmental allergies.  Psychiatric/Behavioral:  Negative for depression and memory loss. The patient does not have insomnia.    Past Medical History:  Diagnosis Date   BPH (benign prostatic hyperplasia)    Coronary artery disease    Diabetes mellitus without complication ([facility])     Diverticulosis of colon    GERD (gastroesophageal reflux disease)    History of bacterial meningitis 1987   Hx of colonic polyps    Hypercholesteremia    no per pt   Hypertension    Increased prostate specific antigen (PSA) velocity    Meniscus tear    left knee   Obesity    Peroneal muscular atrophy    Sleep apnea    wears cpap   Past Surgical History:  Procedure Laterality Date   CHOLECYSTECTOMY     COLONOSCOPY  02/2004   Dr.Streck   KNEE ARTHROSCOPY Left 04/17/2014   Procedure: LEFT KNEE ARTHROSCOPY WITH MEDIAL MENISCAL DEBRIDEMENT  ;  Surgeon: Gearlean Alf, MD;  Location: [facility];  Service: Orthopedics;  Laterality: Left;   POLYPECTOMY     TRIGGER FINGER RELEASE  2009   right hand--Dr Burney Gauze   UPPER GASTROINTESTINAL ENDOSCOPY     Social History:   reports that he quit smoking about 21 years ago. His smoking use included cigarettes. He has a 30.00 pack-year smoking history. He has never used smokeless tobacco. He reports current alcohol use. He reports that he does not use drugs.  Family History  Problem Relation Age of Onset   COPD Mother    Charcot-Marie-Tooth disease Mother    Heart disease Father    Hypertension Father    Prostate cancer Father        dx in his 50's   Cancer - Other Brother    Lung  cancer Brother    Charcot-Marie-Tooth disease Maternal Grandmother    Colon cancer Neg Hx    Esophageal cancer Neg Hx    Stomach cancer Neg Hx    Rectal cancer Neg Hx    Liver cancer Neg Hx    Pancreatic cancer Neg Hx    Colon polyps Neg Hx     Medications: Patient's Medications  New Prescriptions   No medications on file  Previous Medications   ASPIRIN 81 MG TABLET    Take 81 mg by mouth daily.    DUTASTERIDE (AVODART) 0.5 MG CAPSULE       ESOMEPRAZOLE (NEXIUM) 40 MG CAPSULE    Take 1 capsule (40 mg total) by mouth 2 (two) times daily before a meal.   LISINOPRIL (ZESTRIL) 20 MG TABLET    TAKE ONE TABLET BY MOUTH DAILY   METFORMIN (GLUCOPHAGE) 500 MG  TABLET    TAKE ONE TABLET BY MOUTH TWICE A DAY WITH MEALS  Modified Medications   No medications on file  Discontinued Medications   PREDNISONE (STERAPRED UNI-PAK 21 TAB) 10 MG (21) TBPK TABLET    Dispense one 6 day pack. Take as directed with food.    Physical Exam:  Vitals:   01/13/21 0807  BP: 130/80  Pulse: (!) 52  Temp: 98.6 F (37 C)  SpO2: 96%  Weight: 276 lb (125.2 kg)  Height: 6\' 1"  (1.854 m)   Body mass index is 36.41 kg/m. Wt Readings from Last 3 Encounters:  01/13/21 276 lb (125.2 kg)  04/29/20 275 lb (124.7 kg)  09/11/19 280 lb 12.8 oz (127.4 kg)    Physical Exam Constitutional:      General: He is not in acute distress.    Appearance: He is well-developed. He is not diaphoretic.  HENT:     Head: Normocephalic and atraumatic.     Right Ear: External ear normal.     Left Ear: External ear normal.     Nose: Congestion and rhinorrhea present.     Comments: No lesions or sores    Mouth/Throat:     Mouth: Mucous membranes are moist.     Pharynx: Oropharynx is clear. No oropharyngeal exudate or posterior oropharyngeal erythema.  Eyes:     Conjunctiva/sclera: Conjunctivae normal.     Pupils: Pupils are equal, round, and reactive to light.  Cardiovascular:     Rate and Rhythm: Normal rate and regular rhythm.     Heart sounds: Normal heart sounds.  Pulmonary:     Effort: Pulmonary effort is normal.     Breath sounds: Normal breath sounds.  Abdominal:     General: Bowel sounds are normal.     Palpations: Abdomen is soft.  Musculoskeletal:        General: No tenderness.     Cervical back: Normal range of motion and neck supple.     Right lower leg: No edema.     Left lower leg: No edema.  Skin:    General: Skin is warm and dry.  Neurological:     Mental Status: He is alert and oriented to person, place, and time.    Labs reviewed: Basic Metabolic Panel: No results for input(s): NA, K, CL, CO2, GLUCOSE, BUN, CREATININE, CALCIUM, MG, PHOS, TSH in the  last 8760 hours. Liver Function Tests: No results for input(s): AST, ALT, ALKPHOS, BILITOT, PROT, ALBUMIN in the last 8760 hours. No results for input(s): LIPASE, AMYLASE in the last 8760 hours. No results for input(s): AMMONIA in the  last 8760 hours. CBC: No results for input(s): WBC, NEUTROABS, HGB, HCT, MCV, PLT in the last 8760 hours. Lipid Panel: No results for input(s): CHOL, HDL, LDLCALC, TRIG, CHOLHDL, LDLDIRECT in the last 8760 hours. TSH: No results for input(s): TSH in the last 8760 hours. A1C: Lab Results  Component Value Date   HGBA1C 5.9 (A) 09/11/2019     Assessment/Plan 1. Acute non-recurrent sinusitis, unspecified location -neti pot twice daily or nasal rinse Plain nasal saline spray throughout the day as needed May use tylenol 325 mg 2 tablets every 6 hours as needed aches and pains or sore throat humidifier in the home to help with the dry air Keep well hydrated Avoid forcefully blowing nose - azithromycin (ZITHROMAX) 250 MG tablet; Take 2 tablets on day 1, then 1 tablet daily on days 2 through 5  Dispense: 6 tablet; Refill: 0 - mupirocin nasal ointment (BACTROBAN NASAL) 2 %; Place 1 application into the nose 2 (two) times daily.  Dispense: 10 g; Refill: 0 Follow up precautions reviewed Mirielle Byrum K. [place], [facility] [phone]

## 2021-01-13 NOTE — Patient Instructions (Signed)
neti pot twice daily or nasal wash Plain nasal saline spray throughout the day as needed May use tylenol 325 mg 2 tablets every 6 hours as needed aches and pains or sore throat humidifier in the home to help with the dry air Keep well hydrated Avoid forcefully blowing nose  Take antibiotic as prescribed.

## 2021-01-30 ENCOUNTER — Other Ambulatory Visit: Payer: Self-pay | Admitting: Internal Medicine

## 2021-01-30 DIAGNOSIS — E119 Type 2 diabetes mellitus without complications: Secondary | ICD-10-CM

## 2021-03-05 ENCOUNTER — Other Ambulatory Visit: Payer: Self-pay

## 2021-03-05 ENCOUNTER — Ambulatory Visit (INDEPENDENT_AMBULATORY_CARE_PROVIDER_SITE_OTHER): Payer: Medicare Other | Admitting: Urology

## 2021-03-05 ENCOUNTER — Encounter: Payer: Self-pay | Admitting: Urology

## 2021-03-05 VITALS — BP 154/69 | HR 64 | Ht 73.0 in | Wt 275.0 lb

## 2021-03-05 DIAGNOSIS — Z125 Encounter for screening for malignant neoplasm of prostate: Secondary | ICD-10-CM | POA: Diagnosis not present

## 2021-03-05 DIAGNOSIS — N401 Enlarged prostate with lower urinary tract symptoms: Secondary | ICD-10-CM

## 2021-03-05 DIAGNOSIS — N529 Male erectile dysfunction, unspecified: Secondary | ICD-10-CM | POA: Diagnosis not present

## 2021-03-05 DIAGNOSIS — N138 Other obstructive and reflux uropathy: Secondary | ICD-10-CM | POA: Diagnosis not present

## 2021-03-05 LAB — MICROSCOPIC EXAMINATION: Bacteria, UA: NONE SEEN

## 2021-03-05 LAB — URINALYSIS, COMPLETE
Bilirubin, UA: NEGATIVE
Glucose, UA: NEGATIVE
Ketones, UA: NEGATIVE
Nitrite, UA: NEGATIVE
Protein,UA: NEGATIVE
RBC, UA: NEGATIVE
Specific Gravity, UA: 1.02 (ref 1.005–1.030)
Urobilinogen, Ur: 0.2 mg/dL (ref 0.2–1.0)
pH, UA: 6.5 (ref 5.0–7.5)

## 2021-03-05 LAB — BLADDER SCAN AMB NON-IMAGING

## 2021-03-05 MED ORDER — DUTASTERIDE 0.5 MG PO CAPS: 0.5000 mg | ORAL_CAPSULE | Freq: Every day | ORAL | 3 refills | Status: DC

## 2021-03-05 MED ORDER — TADALAFIL 5 MG PO TABS
5.0000 mg | ORAL_TABLET | Freq: Every day | ORAL | 3 refills | Status: DC | PRN
Start: 2021-03-05 — End: 2021-11-24

## 2021-03-05 MED ORDER — TAMSULOSIN HCL 0.4 MG PO CAPS: 0.4000 mg | ORAL_CAPSULE | Freq: Every day | ORAL | 3 refills | Status: DC

## 2021-03-05 NOTE — Patient Instructions (Signed)
Benign Prostatic Hyperplasia Benign prostatic hyperplasia (BPH) is an enlarged prostate gland that is caused by the normal aging process. The prostate may get bigger as a man gets older. The condition is not caused by cancer. The prostate is a walnut-sized gland that is involved in the production of semen. It is located in front of the rectum and below the bladder. The bladder stores urine. The urethra carries stored urine out of the body. An enlarged prostate can press on the urethra. This can make it harder to pass urine. The buildup of urine in the bladder can cause infection. Back pressure and infection may progress to bladder damage and kidney (renal) failure. What are the causes? This condition is part of the normal aging process. However, not all men develop problems from this condition. If the prostate enlarges away from the urethra, urine flow will not be blocked. If it enlarges toward the urethra and compresses it, there will be problems passing urine. What increases the risk? This condition is more likely to develop in men older than 50 years. What are the signs or symptoms? Symptoms of this condition include: Getting up often during the night to urinate. Needing to urinate frequently during the day. Difficulty starting urine flow. Decrease in size and strength of your urine stream. Leaking (dribbling) after urinating. Inability to pass urine. This needs immediate treatment. Inability to completely empty your bladder. Pain when you pass urine. This is more common if there is also an infection. Urinary tract infection (UTI). How is this diagnosed? This condition is diagnosed based on your medical history, a physical exam, and your symptoms. Tests will also be done, such as: A post-void bladder scan. This measures any amount of urine that may remain in your bladder after you finish urinating. A digital rectal exam. In a rectal exam, your health care provider checks your prostate by  putting a lubricated, gloved finger into your rectum to feel the back of your prostate gland. This exam detects the size of your gland and any abnormal lumps or growths. An exam of your urine (urinalysis). A prostate specific antigen (PSA) screening. This is a blood test used to screen for prostate cancer. An ultrasound. This test uses sound waves to electronically produce a picture of your prostate gland. Your health care provider may refer you to a specialist in kidney and prostate diseases (urologist). How is this treated? Once symptoms begin, your health care provider will monitor your condition (active surveillance or watchful waiting). Treatment for this condition will depend on the severity of your condition. Treatment may include: Observation and yearly exams. This may be the only treatment needed if your condition and symptoms are mild. Medicines to relieve your symptoms, including: Medicines to shrink the prostate. Medicines to relax the muscle of the prostate. Surgery in severe cases. Surgery may include: Prostatectomy. In this procedure, the prostate tissue is removed completely through an open incision or with a laparoscope or robotics. Transurethral resection of the prostate (TURP). In this procedure, a tool is inserted through the opening at the tip of the penis (urethra). It is used to cut away tissue of the inner core of the prostate. The pieces are removed through the same opening of the penis. This removes the blockage. Transurethral incision (TUIP). In this procedure, small cuts are made in the prostate. This lessens the prostate's pressure on the urethra. Transurethral microwave thermotherapy (TUMT). This procedure uses microwaves to create heat. The heat destroys and removes a small amount of prostate   tissue. Transurethral needle ablation (TUNA). This procedure uses radio frequencies to destroy and remove a small amount of prostate tissue. Interstitial laser coagulation ([place]).  This procedure uses a laser to destroy and remove a small amount of prostate tissue. Transurethral electrovaporization (TUVP). This procedure uses electrodes to destroy and remove a small amount of prostate tissue. Prostatic urethral lift. This procedure inserts an implant to push the lobes of the prostate away from the urethra. Follow these instructions at home: Take over-the-counter and prescription medicines only as told by your health care provider. Monitor your symptoms for any changes. Contact your health care provider with any changes. Avoid drinking large amounts of liquid before going to bed or out in public. Avoid or reduce how much caffeine or alcohol you drink. Give yourself time when you urinate. Keep all follow-up visits. This is important. Contact a health care provider if: You have unexplained back pain. Your symptoms do not get better with treatment. You develop side effects from the medicine you are taking. Your urine becomes very dark or has a bad smell. Your lower abdomen becomes distended and you have trouble passing urine. Get help right away if: You have a fever or chills. You suddenly cannot urinate. You feel light-headed or very dizzy, or you faint. There are large amounts of blood or clots in your urine. Your urinary problems become hard to manage. You develop moderate to severe low back or flank pain. The flank is the side of your body between the ribs and the hip. These symptoms may be an emergency. Get help right away. Call 911. Do not wait to see if the symptoms will go away. Do not drive yourself to the hospital. Summary Benign prostatic hyperplasia (BPH) is an enlarged prostate that is caused by the normal aging process. It is not caused by cancer. An enlarged prostate can press on the urethra. This can make it hard to pass urine. This condition is more likely to develop in men older than 50 years. Get help right away if you suddenly cannot urinate. This  information is not intended to replace advice given to you by your health care provider. Make sure you discuss any questions you have with your health care provider. Document Revised: 10/08/2020 Document Reviewed: 10/08/2020 Elsevier Patient Education  [place].  Tadalafil Tablets (Erectile Dysfunction, BPH) What is this medication? TADALAFIL (tah DA la fil) treats erectile dysfunction (ED). It works by increasing blood flow to the penis, which helps to maintain an erection. It may also be used to treat symptoms of an enlarged prostate (benign prostatic hyperplasia). This medicine may be used for other purposes; ask your health care provider or pharmacist if you have questions. COMMON BRAND NAME(S): Kathaleen Bury, Cialis What should I tell my care team before I take this medication? They need to know if you have any of these conditions: Anatomical deformation of the penis, Peyronie's disease, or history of priapism (painful and prolonged erection) Bleeding disorders Eye or vision problems, including a rare inherited eye disease called retinitis pigmentosa Heart disease, angina, a history of heart attack, irregular heart beats, or other heart problems High or low blood pressure History of blood diseases, like sickle cell anemia or leukemia History of stomach bleeding Kidney disease Liver disease Stroke An unusual or allergic reaction to tadalafil, other medications, foods, dyes, or preservatives Pregnant or trying to get pregnant Breast-feeding How should I use this medication? Take this medication by mouth with a glass of water. Follow the directions  on the prescription label. You may take this medication with or without meals. When this medication is used for erection problems, your care team may prescribe it to be taken once daily or as needed. If you are taking the medication as needed, you may be able to have sexual activity 30 minutes after taking it and for up to 36 hours  after taking it. Whether you are taking the medication as needed or once daily, you should not take more than one dose per day. If you are taking this medication for symptoms of benign prostatic hyperplasia (BPH) or to treat both BPH and an erection problem, take the dose once daily at about the same time each day. Do not take your medication more often than directed. Talk to your care team about the use of this medication in children. Special care may be needed. Overdosage: If you think you have taken too much of this medicine contact a poison control center or emergency room at once. NOTE: This medicine is only for you. Do not share this medicine with others. What if I miss a dose? If you are taking this medication as needed for erection problems, this does not apply. If you miss a dose while taking this medication once daily for an erection problem, benign prostatic hyperplasia, or both, take it as soon as you remember, but do not take more than one dose per day. What may interact with this medication? Do not take this medication with any of the following: Nitrates like amyl nitrite, isosorbide dinitrate, isosorbide mononitrate, nitroglycerin Other medications for erectile dysfunction like avanafil, sildenafil, vardenafil Other tadalafil products (Adcirca) Riociguat This medication may also interact with the following: Certain medications for high blood pressure Certain medications for the treatment of HIV infection or AIDS Certain medications used for fungal or yeast infections, like fluconazole, itraconazole, ketoconazole, and voriconazole Certain medications used for seizures like carbamazepine, phenytoin, and phenobarbital Grapefruit juice Macrolide antibiotics like clarithromycin, erythromycin, troleandomycin Medications for prostate problems Rifabutin, rifampin or rifapentine This list may not describe all possible interactions. Give your health care provider a list of all the  medicines, herbs, non-prescription drugs, or dietary supplements you use. Also tell them if you smoke, drink alcohol, or use illegal drugs. Some items may interact with your medicine. What should I watch for while using this medication? If you notice any changes in your vision while taking this medication, call your care team as soon as possible. Stop using this medication and call your care team right away if you have a loss of sight in one or both eyes. Contact your care team right away if the erection lasts longer than 4 hours or if it becomes painful. This may be a sign of serious problem and must be treated right away to prevent permanent damage. If you experience symptoms of nausea, dizziness, chest pain or arm pain upon initiation of sexual activity after taking this medication, you should refrain from further activity and call your care team as soon as possible. Do not drink alcohol to excess (examples, 5 glasses of wine or 5 shots of whiskey) when taking this medication. When taken in excess, alcohol can increase your chances of getting a headache or getting dizzy, increasing your heart rate or lowering your blood pressure. Using this medication does not protect you or your partner against HIV infection (the virus that causes AIDS) or other sexually transmitted diseases. What side effects may I notice from receiving this medication? Side effects that you should  report to your care team as soon as possible: Allergic reactions--skin rash, itching, hives, swelling of the face, lips, tongue, or throat Hearing loss or ringing in ears Heart attack--pain or tightness in the chest, shoulders, arms, or jaw, nausea, shortness of breath, cold or clammy skin, feeling faint or lightheaded Low blood pressure--dizziness, feeling faint or lightheaded, blurry vision Prolonged or painful erection Redness, blistering, peeling, or loosening of the skin, including inside the mouth Stroke--sudden numbness or  weakness of the face, arm, or leg, trouble speaking, confusion, trouble walking, loss of balance or coordination, dizziness, severe headache, change in vision Sudden vision loss in one or both eyes Side effects that usually do not require medical attention (report to your care team if they continue or are bothersome): Back pain Facial flushing or redness Headache Muscle pain Runny or stuffy nose Upset stomach This list may not describe all possible side effects. Call your doctor for medical advice about side effects. You may report side effects to FDA at 1-800-FDA-1088. Where should I keep my medication? Keep out of the reach of children. Store at room temperature between 15 and 30 degrees C (59 and 86 degrees F). Throw away any unused medication after the expiration date. NOTE: This sheet is a summary. It may not cover all possible information. If you have questions about this medicine, talk to your doctor, pharmacist, or health care provider.  2022 Elsevier/Gold Standard (2020-06-05 00:00:00)

## 2021-03-05 NOTE — Progress Notes (Signed)
03/05/21 9:20 AM   Ralph Garrett 03-29-1946 [id number]  CC: BPH, history of retention, history of elevated PSA, ED  HPI: 75 year old transferring his care over from [facility].  Relatively healthy male with a long history of BPH, elevated PSA with 4 negative prior biopsies, and minimally bothersome urinary symptoms.  I reviewed the outside notes from Dr. Junious Silk at [facility] urology, and it sounds like he has a long history of elevated PVRs ranging from 200 to 700 mL.  He has been on dutasteride for about 10 years, and PSA decreased appropriately, most recently 2.15.  Doubled for dutasteride use would be 4.3, which is within the normal range for his age.  He was previously on Flomax with improvement in his urinary symptoms, however this was discontinued for unclear reasons.  Aside from some frequency and weak stream after drinking coffee in the morning, he is minimally bothered by his urinary symptoms.  He has nocturia 0-1 time overnight.  He reports 1 episode of urinary retention in March 2022 that was precipitated by heavy Benadryl use.  He did not tolerate the catheter well.  PVR is elevated at 375 mL today, which seems stable from prior values.  He also reports trouble with erections and is interested in trying medications for this.  Urinalysis today is completely benign.  Renal function is normal.  I reviewed the outside notes from Dr. Junious Silk at [facility] urology.  PMH: Past Medical History:  Diagnosis Date   BPH (benign prostatic hyperplasia)    Coronary artery disease    Diabetes mellitus without complication (HCC)    Diverticulosis of colon    GERD (gastroesophageal reflux disease)    History of bacterial meningitis 1987   Hx of colonic polyps    Hypercholesteremia    no per pt   Hypertension    Increased prostate specific antigen (PSA) velocity    Meniscus tear    left knee   Obesity    Peroneal muscular atrophy    Sleep apnea    wears cpap    Surgical  History: Past Surgical History:  Procedure Laterality Date   CHOLECYSTECTOMY     COLONOSCOPY  02/2004   Dr.Streck   KNEE ARTHROSCOPY Left 04/17/2014   Procedure: LEFT KNEE ARTHROSCOPY WITH MEDIAL MENISCAL DEBRIDEMENT  ;  Surgeon: Gearlean Alf, MD;  Location: [facility];  Service: Orthopedics;  Laterality: Left;   POLYPECTOMY     TRIGGER FINGER RELEASE  2009   right hand--Dr Burney Gauze   UPPER GASTROINTESTINAL ENDOSCOPY        Family History: Family History  Problem Relation Age of Onset   COPD Mother    Charcot-Marie-Tooth disease Mother    Heart disease Father    Hypertension Father    Prostate cancer Father        dx in his 29's   Cancer - Other Brother    Lung cancer Brother    Charcot-Marie-Tooth disease Maternal Grandmother    Colon cancer Neg Hx    Esophageal cancer Neg Hx    Stomach cancer Neg Hx    Rectal cancer Neg Hx    Liver cancer Neg Hx    Pancreatic cancer Neg Hx    Colon polyps Neg Hx     Social History:  reports that he quit smoking about 21 years ago. His smoking use included cigarettes. He has a 30.00 pack-year smoking history. He has never used smokeless tobacco. He reports current alcohol use. He reports that he does not  use drugs.  Physical Exam: BP (!) 154/69   Pulse 64   Ht 6\' 1"  (1.854 m)   Wt 275 lb (124.7 kg)   BMI 36.28 kg/m    Constitutional:  Alert and oriented, No acute distress. Cardiovascular: No clubbing, cyanosis, or edema. Respiratory: Normal respiratory effort, no increased work of breathing. GI: Abdomen is soft, nontender, nondistended, no abdominal masses   Laboratory Data: Reviewed, see HPI  Assessment & Plan:   76 year old male with long history of BPH, elevated PSA with multiple negative biopsies, incomplete bladder emptying, and ED.  Regarding the PSA, reassurance was provided as he had an appropriate decrease on dutasteride, PSA is within the normal range for his age.  I did not recommend ongoing PSA screening with his  age and multiple negative biopsies with stable PSA.  Regarding his urinary symptoms, I recommended a trial of Flomax, as it sounds like he has had significant improvement on this medication previously.  We discussed indications to pursue more aggressive treatment including recurrent urinary retention, UTIs, worsening urinary symptoms, bladder stones, or worsening renal function.  Suspect he has a significantly enlarged prostate based on his prior elevated PSAs, and may benefit from HOLEP in the future.  We also discussed discontinuing the dutasteride in the future if he has improvement on the Flomax, as he feels this has some sexual side effects.  He is at the point where he is interested in trying medications for his ED.  We discussed risk benefits between Cialis and Viagra, and he would like to try Cialis 5 to 20 mg on demand.  -Trial of Cialis 5 to 20 mg on demand for ED -Flomax 0.4 mg nightly for BPH -RTC 3 months symptom check, PVR, consider discontinuing dutasteride at that time if symptoms improved -Consider cystoscopy and transrectal ultrasound of prostate in the future if worsening symptoms or if he opts for an outlet procedure  I spent 60 total minutes on the day of the encounter including pre-visit review of the medical record, face-to-face time with the patient, and post visit ordering of labs/imaging/tests.  Nickolas Madrid, MD 03/05/2021  [facility] [address], [place], [place] [address] [phone]

## 2021-03-12 ENCOUNTER — Ambulatory Visit (INDEPENDENT_AMBULATORY_CARE_PROVIDER_SITE_OTHER): Payer: Medicare Other | Admitting: Internal Medicine

## 2021-03-12 ENCOUNTER — Other Ambulatory Visit: Payer: Self-pay | Admitting: Internal Medicine

## 2021-03-12 ENCOUNTER — Encounter: Payer: Self-pay | Admitting: Internal Medicine

## 2021-03-12 VITALS — BP 130/80 | HR 64 | Temp 97.7°F | Ht 74.0 in | Wt 278.0 lb

## 2021-03-12 DIAGNOSIS — E782 Mixed hyperlipidemia: Secondary | ICD-10-CM

## 2021-03-12 DIAGNOSIS — K219 Gastro-esophageal reflux disease without esophagitis: Secondary | ICD-10-CM

## 2021-03-12 DIAGNOSIS — N401 Enlarged prostate with lower urinary tract symptoms: Secondary | ICD-10-CM | POA: Diagnosis not present

## 2021-03-12 DIAGNOSIS — I10 Essential (primary) hypertension: Secondary | ICD-10-CM

## 2021-03-12 DIAGNOSIS — N138 Other obstructive and reflux uropathy: Secondary | ICD-10-CM

## 2021-03-12 DIAGNOSIS — Z23 Encounter for immunization: Secondary | ICD-10-CM | POA: Diagnosis not present

## 2021-03-12 DIAGNOSIS — G4733 Obstructive sleep apnea (adult) (pediatric): Secondary | ICD-10-CM | POA: Diagnosis not present

## 2021-03-12 DIAGNOSIS — E1169 Type 2 diabetes mellitus with other specified complication: Secondary | ICD-10-CM

## 2021-03-12 DIAGNOSIS — Z0000 Encounter for general adult medical examination without abnormal findings: Secondary | ICD-10-CM | POA: Diagnosis not present

## 2021-03-12 DIAGNOSIS — E119 Type 2 diabetes mellitus without complications: Secondary | ICD-10-CM

## 2021-03-12 LAB — COMPREHENSIVE METABOLIC PANEL
ALT: 16 U/L (ref 0–53)
AST: 18 U/L (ref 0–37)
Albumin: 4.4 g/dL (ref 3.5–5.2)
Alkaline Phosphatase: 61 U/L (ref 39–117)
BUN: 17 mg/dL (ref 6–23)
CO2: 30 mEq/L (ref 19–32)
Calcium: 10.2 mg/dL (ref 8.4–10.5)
Chloride: 103 mEq/L (ref 96–112)
Creatinine, Ser: 1.04 mg/dL (ref 0.40–1.50)
GFR: 69.95 mL/min (ref >60.00)
Glucose, Bld: 114 mg/dL — ABNORMAL HIGH (ref 70–99)
Potassium: 4.4 mEq/L (ref 3.5–5.1)
Sodium: 139 mEq/L (ref 135–145)
Total Bilirubin: 0.3 mg/dL (ref 0.2–1.2)
Total Protein: 7.4 g/dL (ref 6.0–8.3)

## 2021-03-12 LAB — CBC WITH DIFFERENTIAL/PLATELET
Basophils Absolute: 0 10*3/uL (ref 0.0–0.1)
Basophils Relative: 0.7 % (ref 0.0–3.0)
Eosinophils Absolute: 0.3 10*3/uL (ref 0.0–0.7)
Eosinophils Relative: 5.6 % — ABNORMAL HIGH (ref 0.0–5.0)
HCT: 41 % (ref 39.0–52.0)
Hemoglobin: 13.9 g/dL (ref 13.0–17.0)
Lymphocytes Relative: 28.6 % (ref 12.0–46.0)
Lymphs Abs: 1.8 10*3/uL (ref 0.7–4.0)
MCHC: 33.9 g/dL (ref 30.0–36.0)
MCV: 89.7 fl (ref 78.0–100.0)
Monocytes Absolute: 0.5 10*3/uL (ref 0.1–1.0)
Monocytes Relative: 8.2 % (ref 3.0–12.0)
Neutro Abs: 3.5 10*3/uL (ref 1.4–7.7)
Neutrophils Relative %: 56.9 % (ref 43.0–77.0)
Platelets: 223 10*3/uL (ref 150.0–400.0)
RBC: 4.56 Mil/uL (ref 4.22–5.81)
RDW: 13.6 % (ref 11.5–15.5)
WBC: 6.2 10*3/uL (ref 4.0–10.5)

## 2021-03-12 LAB — LIPID PANEL
Cholesterol: 175 mg/dL (ref 0–200)
HDL: 45.5 mg/dL (ref >39.00)
LDL Cholesterol: 103 mg/dL — ABNORMAL HIGH (ref 0–99)
NonHDL: 129.35
Total CHOL/HDL Ratio: 4
Triglycerides: 134 mg/dL (ref 0.0–149.0)
VLDL: 26.8 mg/dL (ref 0.0–40.0)

## 2021-03-12 LAB — HEMOGLOBIN A1C: Hgb A1c MFr Bld: 6.2 % (ref 4.6–6.5)

## 2021-03-12 MED ORDER — ATORVASTATIN CALCIUM 40 MG PO TABS: 40.0000 mg | ORAL_TABLET | Freq: Every day | ORAL | 1 refills | Status: DC

## 2021-03-12 MED ORDER — METFORMIN HCL 500 MG PO TABS
500.0000 mg | ORAL_TABLET | Freq: Two times a day (BID) | ORAL | 1 refills | Status: DC
Start: 2021-03-12 — End: 2021-09-07

## 2021-03-12 MED ORDER — LISINOPRIL 20 MG PO TABS: 20.0000 mg | ORAL_TABLET | Freq: Every day | ORAL | 1 refills | Status: DC

## 2021-03-12 NOTE — Progress Notes (Signed)
Established Patient Office Visit     This visit occurred during the SARS-CoV-2 public health emergency.  Safety protocols were in place, including screening questions prior to the visit, additional usage of staff PPE, and extensive cleaning of exam room while observing appropriate contact time as indicated for disinfecting solutions.    CC/Reason for Visit: Subsequent Medicare wellness visit and yearly follow-up chronic medical conditions  HPI: Ralph Garrett is a 75 y.o. male who is coming in today for the above mentioned reasons. Past Medical History is significant for: Hypertension, GERD, diabetes, hyperlipidemia, GERD, BPH, morbid obesity, OSA.  He has been doing well since I last saw him.  He is now wearing AFO braces for foot drop.  He has routine eye and dental care, no perceived hearing issues.  He is overdue for flu and shingles vaccines.  He had a colonoscopy in 2020.   Past Medical/Surgical History: Past Medical History:  Diagnosis Date   BPH (benign prostatic hyperplasia)    Coronary artery disease    Diabetes mellitus without complication (HCC)    Diverticulosis of colon    GERD (gastroesophageal reflux disease)    History of bacterial meningitis 1987   Hx of colonic polyps    Hypercholesteremia    no per pt   Hypertension    Increased prostate specific antigen (PSA) velocity    Meniscus tear    left knee   Obesity    Peroneal muscular atrophy    Sleep apnea    wears cpap    Past Surgical History:  Procedure Laterality Date   CHOLECYSTECTOMY     COLONOSCOPY  02/2004   Dr.Streck   KNEE ARTHROSCOPY Left 04/17/2014   Procedure: LEFT KNEE ARTHROSCOPY WITH MEDIAL MENISCAL DEBRIDEMENT  ;  Surgeon: Gearlean Alf, MD;  Location: [facility];  Service: Orthopedics;  Laterality: Left;   POLYPECTOMY     TRIGGER FINGER RELEASE  2009   right hand--Dr Burney Gauze   UPPER GASTROINTESTINAL ENDOSCOPY      Social History:  reports that he quit smoking about 21 years ago.  His smoking use included cigarettes. He has a 30.00 pack-year smoking history. He has never used smokeless tobacco. He reports current alcohol use. He reports that he does not use drugs.  Allergies: Allergies  Allergen Reactions   Penicillins     REACTION: rash as a child    Family History:  Family History  Problem Relation Age of Onset   COPD Mother    Charcot-Marie-Tooth disease Mother    Heart disease Father    Hypertension Father    Prostate cancer Father        dx in his 43's   Cancer - Other Brother    Lung cancer Brother    Charcot-Marie-Tooth disease Maternal Grandmother    Colon cancer Neg Hx    Esophageal cancer Neg Hx    Stomach cancer Neg Hx    Rectal cancer Neg Hx    Liver cancer Neg Hx    Pancreatic cancer Neg Hx    Colon polyps Neg Hx      Current Outpatient Medications:    aspirin 81 MG tablet, Take 81 mg by mouth daily. , Disp: , Rfl:    dutasteride (AVODART) 0.5 MG capsule, Take 1 capsule (0.5 mg total) by mouth daily., Disp: 90 capsule, Rfl: 3   esomeprazole (NEXIUM) 40 MG capsule, Take 1 capsule (40 mg total) by mouth 2 (two) times daily before a meal. (Patient taking  differently: Take 40 mg by mouth as needed.), Disp: 60 capsule, Rfl: 3   tadalafil (CIALIS) 5 MG tablet, Take 1-4 tablets (5-20 mg total) by mouth daily as needed for erectile dysfunction., Disp: 90 tablet, Rfl: 3   tamsulosin (FLOMAX) 0.4 MG CAPS capsule, Take 1 capsule (0.4 mg total) by mouth daily., Disp: 90 capsule, Rfl: 3   lisinopril (ZESTRIL) 20 MG tablet, Take 1 tablet (20 mg total) by mouth daily., Disp: 90 tablet, Rfl: 1   metFORMIN (GLUCOPHAGE) 500 MG tablet, Take 1 tablet (500 mg total) by mouth 2 (two) times daily with a meal., Disp: 180 tablet, Rfl: 1  Review of Systems:  Constitutional: Denies fever, chills, diaphoresis, appetite change and fatigue.  HEENT: Denies photophobia, eye pain, redness, hearing loss, ear pain, congestion, sore throat, rhinorrhea, sneezing, mouth  sores, trouble swallowing, neck pain, neck stiffness and tinnitus.   Respiratory: Denies SOB, DOE, cough, chest tightness,  and wheezing.   Cardiovascular: Denies chest pain, palpitations and leg swelling.  Gastrointestinal: Denies nausea, vomiting, abdominal pain, diarrhea, constipation, blood in stool and abdominal distention.  Genitourinary: Denies dysuria, urgency, frequency, hematuria, flank pain and difficulty urinating.  Endocrine: Denies: hot or cold intolerance, sweats, changes in hair or nails, polyuria, polydipsia. Musculoskeletal: Denies myalgias, back pain, joint swelling, arthralgias and gait problem.  Skin: Denies pallor, rash and wound.  Neurological: Denies dizziness, seizures, syncope, weakness, light-headedness, numbness and headaches.  Hematological: Denies adenopathy. Easy bruising, personal or family bleeding history  Psychiatric/Behavioral: Denies suicidal ideation, mood changes, confusion, nervousness, sleep disturbance and agitation    Physical Exam: Vitals:   03/12/21 0757  BP: 130/80  Pulse: 64  Temp: 97.7 F (36.5 C)  TempSrc: Oral  SpO2: 98%  Weight: 278 lb (126.1 kg)  Height: 6\' 2"  (1.88 m)    Body mass index is 35.69 kg/m.   Constitutional: NAD, calm, comfortable Eyes: PERRL, lids and conjunctivae normal, wears corrective lenses ENMT: Mucous membranes are moist. Posterior pharynx clear of any exudate or lesions. Normal dentition. Tympanic membrane is pearly white, no erythema or bulging. Neck: normal, supple, no masses, no thyromegaly Respiratory: clear to auscultation bilaterally, no wheezing, no crackles. Normal respiratory effort. No accessory muscle use.  Cardiovascular: Regular rate and rhythm, no murmurs / rubs / gallops. No extremity edema. 2+ pedal pulses. No carotid bruits.  Abdomen: no tenderness, no masses palpated. No hepatosplenomegaly. Bowel sounds positive.  Musculoskeletal: no clubbing / cyanosis. No joint deformity upper and lower  extremities. Good ROM, no contractures. Normal muscle tone.  Skin: no rashes, lesions, ulcers. No induration Neurologic: CN 2-12 grossly intact. Sensation intact, DTR normal. Strength 5/5 in all 4.  Psychiatric: Normal judgment and insight. Alert and oriented x 3. Normal mood.    Subsequent Medicare wellness visit   1. Risk factors, based on past  M,S,F -cardiovascular disease risk factors include age, gender, obesity, history of hypertension, diabetes, hyperlipidemia   2.  Physical activities: Walks and rides his bike   3.  Depression/mood: Stable, not depressed   4.  Hearing: No perceived issues   5.  ADL's: Independent in all ADLs   6.  Fall risk: Medium fall risk as he wears AFO braces   7.  Home safety: No problems identified   8.  Height weight, and visual acuity: height and weight as above, vision:  Vision Screening   Right eye Left eye Both eyes  Without correction     With correction 20/20 20/32 20/20      9.  Counseling: Advised he update his vaccination status   10. Lab orders based on risk factors: Laboratory update will be reviewed   11. Referral : None today   12. Care plan: Follow-up with me in 6 months   13. Cognitive assessment: No cognitive impairment   14. Screening: Patient provided with a written and personalized 5-10 year screening schedule in the AVS. yes   15. Provider List Update: PCP only  16. Advance Directives: Full code   17. Opioids: Patient is not on any opioid prescriptions and has no risk factors for a substance use disorder.   [place] Office Visit from 03/12/2021 in [facility] [facility]  PHQ-9 Total Score 0       Fall Risk 08/16/2018 12/13/2018 09/11/2019 10/27/2020 03/12/2021  Falls in the past year? 1 0 1 - 1  Was there an injury with Fall? 0 - 0 - 1  Fall Risk Category Calculator 2 - 2 - 2  Fall Risk Category Moderate - Moderate - Moderate  Patient Fall Risk Level - - - Low fall risk -     Impression and  Plan:  Encounter for preventive health examination -Recommend routine eye and dental care. -Immunizations: Flu vaccine administered in office today, he will get shingles vaccinations at pharmacy. -Healthy lifestyle discussed in detail. -Labs to be updated today. -Colon cancer screening: September/2020 -Breast cancer screening: Not applicable -Cervical cancer screening: Not applicable -Lung cancer screening: Not applicable as he quit smoking over 20 years ago -Prostate cancer screening: Followed by urology -DEXA: Not applicable  Essential hypertension  - Plan: lisinopril (ZESTRIL) 20 MG tablet -Blood pressure is elevated today, he will do ambulatory blood pressure measurement and return in around 4 weeks for follow-up.  Obstructive sleep apnea -On nightly CPAP.  Gastroesophageal reflux disease without esophagitis -Stable on daily PPI therapy.  Benign prostatic hyperplasia with urinary obstruction -Followed by urology, on Flomax.  Mixed hyperlipidemia  - Plan: Lipid panel -Goal LDL is less than 70 given history of diabetes not currently on a statin.  Need for influenza vaccination -Flu vaccine administered today.  Type 2 diabetes mellitus with other specified complication, without long-term current use of insulin (HCC)  - Plan: CBC with Differential/Platelet, Comprehensive metabolic panel, Hemoglobin A1c -He is on metformin, check A1c today.    Patient Instructions  -Nice seeing you today!!  -Lab work today; will notify you once results are available.  -Flu vaccine today.  -Remember your shingles vaccine at the pharmacy.  -Check your BP 2-3 times a week and check back with me in about 4 weeks with your measurements.      Lelon Frohlich, MD [facility] at [facility]

## 2021-03-12 NOTE — Addendum Note (Signed)
Addended by: Westley Hummer B on: 03/12/2021 10:59 AM   Modules accepted: Orders

## 2021-03-12 NOTE — Patient Instructions (Signed)
-  Nice seeing you today!!  -Lab work today; will notify you once results are available.  -Flu vaccine today.  -Remember your shingles vaccine at the pharmacy.  -Check your BP 2-3 times a week and check back with me in about 4 weeks with your measurements.

## 2021-03-17 ENCOUNTER — Other Ambulatory Visit: Payer: Self-pay | Admitting: Internal Medicine

## 2021-03-17 DIAGNOSIS — E782 Mixed hyperlipidemia: Secondary | ICD-10-CM

## 2021-03-18 ENCOUNTER — Encounter: Payer: Self-pay | Admitting: Podiatry

## 2021-03-18 ENCOUNTER — Ambulatory Visit (INDEPENDENT_AMBULATORY_CARE_PROVIDER_SITE_OTHER): Payer: Medicare Other | Admitting: Podiatry

## 2021-03-18 ENCOUNTER — Other Ambulatory Visit: Payer: Self-pay

## 2021-03-18 DIAGNOSIS — M7752 Other enthesopathy of left foot: Secondary | ICD-10-CM | POA: Diagnosis not present

## 2021-03-18 DIAGNOSIS — G600 Hereditary motor and sensory neuropathy: Secondary | ICD-10-CM

## 2021-03-18 DIAGNOSIS — E1142 Type 2 diabetes mellitus with diabetic polyneuropathy: Secondary | ICD-10-CM | POA: Diagnosis not present

## 2021-03-18 DIAGNOSIS — D2372 Other benign neoplasm of skin of left lower limb, including hip: Secondary | ICD-10-CM

## 2021-03-18 DIAGNOSIS — Q667 Congenital pes cavus, unspecified foot: Secondary | ICD-10-CM

## 2021-03-18 DIAGNOSIS — D2371 Other benign neoplasm of skin of right lower limb, including hip: Secondary | ICD-10-CM

## 2021-03-18 MED ORDER — DEXAMETHASONE SODIUM PHOSPHATE 120 MG/30ML IJ SOLN
2.0000 mg | Freq: Once | INTRAMUSCULAR | Status: AC
Start: 2021-03-18 — End: 2021-03-18
  Administered 2021-03-18: 10:00:00 2 mg via INTRA_ARTICULAR

## 2021-03-18 NOTE — Progress Notes (Signed)
He presents today chief complaint of painful lesions plantar aspect of the left foot.  States it really hurts right under here as he points to the subfifth metatarsal head of the left foot.  Objective: Vital signs are stable he is alert and oriented x3 neuromuscular deficit deficit from CMT is present.  Left side is much weaker than that of the right.  Reactive hyper keratoma subfirst and some fifth left foot.  He also has a palpable bursa beneath the fifth metatarsal head of the left foot.  Assessment: Palpable bursitis subfifth left.  Also painful benign skin lesions.  Plan: Debridement of benign skin lesions bilateral.  Injected the subfifth metatarsal head with 2 mg of dexamethasone local anesthetic follow-up with him on an as-needed basis.  He states he is going to [place] for 2 months.

## 2021-03-20 ENCOUNTER — Encounter: Payer: Self-pay | Admitting: Internal Medicine

## 2021-03-26 DIAGNOSIS — Z20822 Contact with and (suspected) exposure to covid-19: Secondary | ICD-10-CM | POA: Diagnosis not present

## 2021-04-01 ENCOUNTER — Encounter: Payer: Self-pay | Admitting: Urology

## 2021-05-12 DIAGNOSIS — M1711 Unilateral primary osteoarthritis, right knee: Secondary | ICD-10-CM | POA: Diagnosis not present

## 2021-05-12 DIAGNOSIS — I10 Essential (primary) hypertension: Secondary | ICD-10-CM | POA: Diagnosis not present

## 2021-05-12 DIAGNOSIS — M76891 Other specified enthesopathies of right lower limb, excluding foot: Secondary | ICD-10-CM | POA: Diagnosis not present

## 2021-05-12 DIAGNOSIS — Z6837 Body mass index (BMI) 37.0-37.9, adult: Secondary | ICD-10-CM | POA: Diagnosis not present

## 2021-05-12 DIAGNOSIS — E1165 Type 2 diabetes mellitus with hyperglycemia: Secondary | ICD-10-CM | POA: Diagnosis not present

## 2021-05-12 DIAGNOSIS — M7651 Patellar tendinitis, right knee: Secondary | ICD-10-CM | POA: Diagnosis not present

## 2021-05-12 DIAGNOSIS — Z79899 Other long term (current) drug therapy: Secondary | ICD-10-CM | POA: Diagnosis not present

## 2021-05-12 DIAGNOSIS — M25561 Pain in right knee: Secondary | ICD-10-CM | POA: Diagnosis not present

## 2021-06-11 DIAGNOSIS — D1801 Hemangioma of skin and subcutaneous tissue: Secondary | ICD-10-CM | POA: Diagnosis not present

## 2021-06-11 DIAGNOSIS — D485 Neoplasm of uncertain behavior of skin: Secondary | ICD-10-CM | POA: Diagnosis not present

## 2021-06-11 DIAGNOSIS — L570 Actinic keratosis: Secondary | ICD-10-CM | POA: Diagnosis not present

## 2021-06-11 DIAGNOSIS — D045 Carcinoma in situ of skin of trunk: Secondary | ICD-10-CM | POA: Diagnosis not present

## 2021-06-11 DIAGNOSIS — L821 Other seborrheic keratosis: Secondary | ICD-10-CM | POA: Diagnosis not present

## 2021-06-11 DIAGNOSIS — L820 Inflamed seborrheic keratosis: Secondary | ICD-10-CM | POA: Diagnosis not present

## 2021-06-17 ENCOUNTER — Ambulatory Visit (INDEPENDENT_AMBULATORY_CARE_PROVIDER_SITE_OTHER): Payer: Medicare Other | Admitting: Internal Medicine

## 2021-06-17 ENCOUNTER — Encounter: Payer: Self-pay | Admitting: Internal Medicine

## 2021-06-17 ENCOUNTER — Telehealth: Payer: Self-pay | Admitting: Internal Medicine

## 2021-06-17 VITALS — BP 130/80 | HR 60 | Temp 97.6°F | Ht 74.0 in | Wt 269.6 lb

## 2021-06-17 DIAGNOSIS — I10 Essential (primary) hypertension: Secondary | ICD-10-CM

## 2021-06-17 DIAGNOSIS — E1169 Type 2 diabetes mellitus with other specified complication: Secondary | ICD-10-CM | POA: Diagnosis not present

## 2021-06-17 DIAGNOSIS — E782 Mixed hyperlipidemia: Secondary | ICD-10-CM | POA: Diagnosis not present

## 2021-06-17 LAB — COMPREHENSIVE METABOLIC PANEL
ALT: 19 U/L (ref 0–53)
AST: 20 U/L (ref 0–37)
Albumin: 4.5 g/dL (ref 3.5–5.2)
Alkaline Phosphatase: 57 U/L (ref 39–117)
BUN: 16 mg/dL (ref 6–23)
CO2: 31 mEq/L (ref 19–32)
Calcium: 9.8 mg/dL (ref 8.4–10.5)
Chloride: 101 mEq/L (ref 96–112)
Creatinine, Ser: 0.91 mg/dL (ref 0.40–1.50)
GFR: 81.95 mL/min (ref >60.00)
Glucose, Bld: 115 mg/dL — ABNORMAL HIGH (ref 70–99)
Potassium: 4.5 mEq/L (ref 3.5–5.1)
Sodium: 138 mEq/L (ref 135–145)
Total Bilirubin: 0.4 mg/dL (ref 0.2–1.2)
Total Protein: 7.2 g/dL (ref 6.0–8.3)

## 2021-06-17 LAB — LIPID PANEL
Cholesterol: 109 mg/dL (ref 0–200)
HDL: 40.1 mg/dL (ref >39.00)
LDL Cholesterol: 48 mg/dL (ref 0–99)
NonHDL: 69.37
Total CHOL/HDL Ratio: 3
Triglycerides: 106 mg/dL (ref 0.0–149.0)
VLDL: 21.2 mg/dL (ref 0.0–40.0)

## 2021-06-17 LAB — POCT GLYCOSYLATED HEMOGLOBIN (HGB A1C): Hemoglobin A1C: 6.4 % — AB (ref 4.0–5.6)

## 2021-06-17 MED ORDER — VALSARTAN-HYDROCHLOROTHIAZIDE 160-25 MG PO TABS: 1.0000 | ORAL_TABLET | Freq: Every day | ORAL | 1 refills | Status: DC

## 2021-06-17 NOTE — Progress Notes (Signed)
? ? ? ?Established Patient Office Visit ? ? ? ? ?This visit occurred during the SARS-CoV-2 public health emergency.  Safety protocols were in place, including screening questions prior to the visit, additional usage of staff PPE, and extensive cleaning of exam room while observing appropriate contact time as indicated for disinfecting solutions.  ? ? ?CC/Reason for Visit: Follow-up chronic conditions ? ?HPI: Ralph Garrett is a 76 y.o. male who is coming in today for the above mentioned reasons. Past Medical History is significant for: Type 2 diabetes, hypertension, hyperlipidemia, GERD, morbid obesity, obstructive sleep apnea, BPH.  He is feeling well and has no acute concerns.  He has noted his blood pressure at home to be around the 160/90 range.  He is concerned as multiple family members have had strokes.  He was started on atorvastatin 40 mg in December due to suboptimal LDL.  He is tolerating this well. ? ? ?Past Medical/Surgical History: ?Past Medical History:  ?Diagnosis Date  ? BPH (benign prostatic hyperplasia)   ? Coronary artery disease   ? Diabetes mellitus without complication ([place])   ? Diverticulosis of colon   ? GERD (gastroesophageal reflux disease)   ? History of bacterial meningitis 1987  ? Hx of colonic polyps   ? Hypercholesteremia   ? no per pt  ? Hypertension   ? Increased prostate specific antigen (PSA) velocity   ? Meniscus tear   ? left knee  ? Obesity   ? Peroneal muscular atrophy   ? Sleep apnea   ? wears cpap  ? ? ?Past Surgical History:  ?Procedure Laterality Date  ? CHOLECYSTECTOMY    ? COLONOSCOPY  02/2004  ? Dr.Streck  ? KNEE ARTHROSCOPY Left 04/17/2014  ? Procedure: LEFT KNEE ARTHROSCOPY WITH MEDIAL MENISCAL DEBRIDEMENT  ;  Surgeon: Gearlean Alf, MD;  Location: [facility];  Service: Orthopedics;  Laterality: Left;  ? POLYPECTOMY    ? TRIGGER FINGER RELEASE  2009  ? right hand--Dr Burney Gauze  ? UPPER GASTROINTESTINAL ENDOSCOPY    ? ? ?Social History: ? reports that he quit smoking  about 22 years ago. His smoking use included cigarettes. He has a 30.00 pack-year smoking history. He has never used smokeless tobacco. He reports current alcohol use. He reports that he does not use drugs. ? ?Allergies: ?Allergies  ?Allergen Reactions  ? Penicillins   ?  REACTION: rash as a child  ? ? ?Family History:  ?Family History  ?Problem Relation Age of Onset  ? COPD Mother   ? Charcot-Marie-Tooth disease Mother   ? Heart disease Father   ? Hypertension Father   ? Prostate cancer Father   ?     dx in his 60's  ? Cancer - Other Brother   ? Lung cancer Brother   ? Charcot-Marie-Tooth disease Maternal Grandmother   ? Colon cancer Neg Hx   ? Esophageal cancer Neg Hx   ? Stomach cancer Neg Hx   ? Rectal cancer Neg Hx   ? Liver cancer Neg Hx   ? Pancreatic cancer Neg Hx   ? Colon polyps Neg Hx   ? ? ? ?Current Outpatient Medications:  ?  aspirin 81 MG tablet, Take 81 mg by mouth daily. , Disp: , Rfl:  ?  atorvastatin (LIPITOR) 40 MG tablet, Take 1 tablet (40 mg total) by mouth daily., Disp: 90 tablet, Rfl: 1 ?  metFORMIN (GLUCOPHAGE) 500 MG tablet, Take 1 tablet (500 mg total) by mouth 2 (two) times daily with a  meal., Disp: 180 tablet, Rfl: 1 ?  tadalafil (CIALIS) 5 MG tablet, Take 1-4 tablets (5-20 mg total) by mouth daily as needed for erectile dysfunction., Disp: 90 tablet, Rfl: 3 ?  tamsulosin (FLOMAX) 0.4 MG CAPS capsule, Take 1 capsule (0.4 mg total) by mouth daily., Disp: 90 capsule, Rfl: 3 ?  valsartan-hydrochlorothiazide (DIOVAN-HCT) 160-25 MG tablet, Take 1 tablet by mouth daily., Disp: 90 tablet, Rfl: 1 ? ?Review of Systems:  ?Constitutional: Denies fever, chills, diaphoresis, appetite change and fatigue.  ?HEENT: Denies photophobia, eye pain, redness, hearing loss, ear pain, congestion, sore throat, rhinorrhea, sneezing, mouth sores, trouble swallowing, neck pain, neck stiffness and tinnitus.   ?Respiratory: Denies SOB, DOE, cough, chest tightness,  and wheezing.   ?Cardiovascular: Denies chest pain,  palpitations and leg swelling.  ?Gastrointestinal: Denies nausea, vomiting, abdominal pain, diarrhea, constipation, blood in stool and abdominal distention.  ?Genitourinary: Denies dysuria, urgency, frequency, hematuria, flank pain and difficulty urinating.  ?Endocrine: Denies: hot or cold intolerance, sweats, changes in hair or nails, polyuria, polydipsia. ?Musculoskeletal: Denies myalgias, back pain, joint swelling, arthralgias and gait problem.  ?Skin: Denies pallor, rash and wound.  ?Neurological: Denies dizziness, seizures, syncope, weakness, light-headedness, numbness and headaches.  ?Hematological: Denies adenopathy. Easy bruising, personal or family bleeding history  ?Psychiatric/Behavioral: Denies suicidal ideation, mood changes, confusion, nervousness, sleep disturbance and agitation ? ? ? ?Physical Exam: ?Vitals:  ? 06/17/21 1002  ?BP: 130/80  ?Pulse: 60  ?Temp: 97.6 ?F (36.4 ?C)  ?TempSrc: Oral  ?SpO2: 99%  ?Weight: 269 lb 9.6 oz (122.3 kg)  ? ? ?Body mass index is 34.61 kg/m?. ? ? ?Constitutional: NAD, calm, comfortable ?Eyes: PERRL, lids and conjunctivae normal ?ENMT: Mucous membranes are moist.  ?Neck: normal, supple, no masses, no thyromegaly ?Cardiovascular: Regular rate and rhythm, no murmurs / rubs / gallops. No extremity edema. No carotid bruits.  ?Neurologic: Grossly intact and nonfocal ?Psychiatric: Normal judgment and insight. Alert and oriented x 3. Normal mood.  ? ? ?Impression and Plan: ? ?Type 2 diabetes mellitus with other specified complication, without long-term current use of insulin ([place]) ? - Plan: POCT glycosylated hemoglobin (Hb A1C), Hemoglobin A1c ?-In office A1c demonstrates good control at 6.4. ? ?Essential hypertension  ?- Plan: Comprehensive metabolic panel, valsartan-hydrochlorothiazide (DIOVAN-HCT) 160-25 MG tablet ?-Not well controlled. ?-Discontinue lisinopril, start Diovan HCT 160/25 mg. ?-Continue ambulatory blood pressure monitoring and return in 6 to 8 weeks for  follow-up. ? ?Mixed hyperlipidemia  ?- Plan: Lipid panel ?-Continue atorvastatin 40 mg daily ? ?Time spent: 34 minutes reviewing chart, interviewing and examining patient and formulating plan of care. ? ? ?Patient Instructions  ?-Nice seeing you today!! ? ?-Lab work today; will notify you once results are available. ? ?-Stop lisinopril. ? ?-Start valsartan/HCT 160/25 mg 1 tablet daily. ? ?-May use mucinex twice a day and zyrtec daily as needed. ? ?-Schedule follow up in 6-8 weeks. ? ? ? ?Lelon Frohlich, MD ?[facility] at [facility] ? ? ?

## 2021-06-17 NOTE — Patient Instructions (Signed)
-  Nice seeing you today!! ? ?-Lab work today; will notify you once results are available. ? ?-Stop lisinopril. ? ?-Start valsartan/HCT 160/25 mg 1 tablet daily. ? ?-May use mucinex twice a day and zyrtec daily as needed. ? ?-Schedule follow up in 6-8 weeks. ?

## 2021-06-17 NOTE — Telephone Encounter (Signed)
Rachel from Dr. Donato Heinz office is calling in regarding a form that was faxed over. ? ?Apolonio Schneiders could be contacted at [phone]. ? ?Please advise. ?

## 2021-06-17 NOTE — Telephone Encounter (Signed)
Spoke with Ralph Garrett at Dr [facility] office. Patient's last eye exam was 2021. ?

## 2021-08-13 ENCOUNTER — Encounter: Payer: Self-pay | Admitting: Urology

## 2021-08-17 ENCOUNTER — Encounter: Payer: Self-pay | Admitting: Internal Medicine

## 2021-08-17 ENCOUNTER — Ambulatory Visit (INDEPENDENT_AMBULATORY_CARE_PROVIDER_SITE_OTHER): Payer: Medicare Other | Admitting: Internal Medicine

## 2021-08-17 VITALS — BP 136/76 | HR 70 | Temp 98.2°F | Wt 276.2 lb

## 2021-08-17 DIAGNOSIS — I10 Essential (primary) hypertension: Secondary | ICD-10-CM

## 2021-08-17 DIAGNOSIS — E782 Mixed hyperlipidemia: Secondary | ICD-10-CM | POA: Diagnosis not present

## 2021-08-17 DIAGNOSIS — E1169 Type 2 diabetes mellitus with other specified complication: Secondary | ICD-10-CM

## 2021-08-17 NOTE — Progress Notes (Signed)
? ? ? ?Established Patient Office Visit ? ? ? ? ?CC/Reason for Visit: Follow-up chronic conditions ? ?HPI: Ralph Garrett is a 76 y.o. male who is coming in today for the above mentioned reasons. Past Medical History is significant for: Hypertension, hyperlipidemia, type 2 diabetes, morbid obesity, GERD, BPH, OSA.  At last visit he had blood pressures of 160/190, he was taken off lisinopril and placed on Diovan HCT 160/25 mg.  He is tolerating medication well.  He is not doing ambulatory measurements.  2 in office blood pressures are 140/80 and 132/76. ? ? ?Past Medical/Surgical History: ?Past Medical History:  ?Diagnosis Date  ? BPH (benign prostatic hyperplasia)   ? Coronary artery disease   ? Diabetes mellitus without complication ([place])   ? Diverticulosis of colon   ? GERD (gastroesophageal reflux disease)   ? History of bacterial meningitis 1987  ? Hx of colonic polyps   ? Hypercholesteremia   ? no per pt  ? Hypertension   ? Increased prostate specific antigen (PSA) velocity   ? Meniscus tear   ? left knee  ? Obesity   ? Peroneal muscular atrophy   ? Sleep apnea   ? wears cpap  ? ? ?Past Surgical History:  ?Procedure Laterality Date  ? CHOLECYSTECTOMY    ? COLONOSCOPY  02/2004  ? Dr.Streck  ? KNEE ARTHROSCOPY Left 04/17/2014  ? Procedure: LEFT KNEE ARTHROSCOPY WITH MEDIAL MENISCAL DEBRIDEMENT  ;  Surgeon: Gearlean Alf, MD;  Location: [facility];  Service: Orthopedics;  Laterality: Left;  ? POLYPECTOMY    ? TRIGGER FINGER RELEASE  2009  ? right hand--Dr Burney Gauze  ? UPPER GASTROINTESTINAL ENDOSCOPY    ? ? ?Social History: ? reports that he quit smoking about 22 years ago. His smoking use included cigarettes. He has a 30.00 pack-year smoking history. He has never used smokeless tobacco. He reports current alcohol use. He reports that he does not use drugs. ? ?Allergies: ?Allergies  ?Allergen Reactions  ? Penicillins   ?  REACTION: rash as a child  ? ? ?Family History:  ?Family History  ?Problem Relation Age of  Onset  ? COPD Mother   ? Charcot-Marie-Tooth disease Mother   ? Heart disease Father   ? Hypertension Father   ? Prostate cancer Father   ?     dx in his 29's  ? Cancer - Other Brother   ? Lung cancer Brother   ? Charcot-Marie-Tooth disease Maternal Grandmother   ? Colon cancer Neg Hx   ? Esophageal cancer Neg Hx   ? Stomach cancer Neg Hx   ? Rectal cancer Neg Hx   ? Liver cancer Neg Hx   ? Pancreatic cancer Neg Hx   ? Colon polyps Neg Hx   ? ? ? ?Current Outpatient Medications:  ?  aspirin 81 MG tablet, Take 81 mg by mouth daily. , Disp: , Rfl:  ?  atorvastatin (LIPITOR) 40 MG tablet, Take 1 tablet (40 mg total) by mouth daily., Disp: 90 tablet, Rfl: 1 ?  metFORMIN (GLUCOPHAGE) 500 MG tablet, Take 1 tablet (500 mg total) by mouth 2 (two) times daily with a meal., Disp: 180 tablet, Rfl: 1 ?  tadalafil (CIALIS) 5 MG tablet, Take 1-4 tablets (5-20 mg total) by mouth daily as needed for erectile dysfunction., Disp: 90 tablet, Rfl: 3 ?  tamsulosin (FLOMAX) 0.4 MG CAPS capsule, Take 1 capsule (0.4 mg total) by mouth daily., Disp: 90 capsule, Rfl: 3 ?  valsartan-hydrochlorothiazide (DIOVAN-HCT) 160-25  MG tablet, Take 1 tablet by mouth daily., Disp: 90 tablet, Rfl: 1 ? ?Review of Systems:  ?Constitutional: Denies fever, chills, diaphoresis, appetite change and fatigue.  ?HEENT: Denies photophobia, eye pain, redness, hearing loss, ear pain, congestion, sore throat, rhinorrhea, sneezing, mouth sores, trouble swallowing, neck pain, neck stiffness and tinnitus.   ?Respiratory: Denies SOB, DOE, cough, chest tightness,  and wheezing.   ?Cardiovascular: Denies chest pain, palpitations and leg swelling.  ?Gastrointestinal: Denies nausea, vomiting, abdominal pain, diarrhea, constipation, blood in stool and abdominal distention.  ?Genitourinary: Denies dysuria, urgency, frequency, hematuria, flank pain and difficulty urinating.  ?Endocrine: Denies: hot or cold intolerance, sweats, changes in hair or nails, polyuria,  polydipsia. ?Musculoskeletal: Denies myalgias, back pain, joint swelling, arthralgias and gait problem.  ?Skin: Denies pallor, rash and wound.  ?Neurological: Denies dizziness, seizures, syncope, weakness, light-headedness, numbness and headaches.  ?Hematological: Denies adenopathy. Easy bruising, personal or family bleeding history  ?Psychiatric/Behavioral: Denies suicidal ideation, mood changes, confusion, nervousness, sleep disturbance and agitation ? ? ? ?Physical Exam: ?Vitals:  ? 08/17/21 0958 08/17/21 1001  ?BP: 140/80 136/76  ?Pulse: 70   ?Temp: 98.2 ?F (36.8 ?C)   ?TempSrc: Oral   ?SpO2: 98%   ?Weight: 276 lb 3.2 oz (125.3 kg)   ? ? ?Body mass index is 35.46 kg/m?. ? ? ?Constitutional: NAD, calm, comfortable ?Eyes: PERRL, lids and conjunctivae normal, wears corrective lenses ?ENMT: Mucous membranes are moist.  ?Respiratory: clear to auscultation bilaterally, no wheezing, no crackles. Normal respiratory effort. No accessory muscle use.  ?Cardiovascular: Regular rate and rhythm, no murmurs / rubs / gallops. No extremity edema.  ?Psychiatric: Normal judgment and insight. Alert and oriented x 3. Normal mood.  ? ? ?Impression and Plan: ? ?Type 2 diabetes mellitus with other specified complication, without long-term current use of insulin ([place]) ?-Well-controlled with an A1c of 6.4 on metformin. ? ?Essential hypertension ?-Measurements are slightly above goal.  He will do ambulatory measurements and we will reconvene in 8 to 12 months to discuss.  May need to augment antihypertensive therapy. ? ?Mixed hyperlipidemia ?-Well-controlled with an LDL of 48 in March 2023.  On atorvastatin 40 mg daily. ? ? ? ?Time spent:30 minutes reviewing chart, interviewing and examining patient and formulating plan of care. ? ? ?Patient Instructions  ?-Nice seeing you today!! ? ?-Check BP 2-3 times a week and bring measurements into your next visit in 8-12 weeks. ? ? ? ?Lelon Frohlich, MD ?[facility] at  [facility] ? ? ?

## 2021-08-17 NOTE — Patient Instructions (Signed)
-  Nice seeing you today!! ? ?-Check BP 2-3 times a week and bring measurements into your next visit in 8-12 weeks. ?

## 2021-08-20 ENCOUNTER — Ambulatory Visit (INDEPENDENT_AMBULATORY_CARE_PROVIDER_SITE_OTHER): Payer: Medicare Other | Admitting: Podiatry

## 2021-08-20 ENCOUNTER — Encounter: Payer: Self-pay | Admitting: Podiatry

## 2021-08-20 DIAGNOSIS — E1142 Type 2 diabetes mellitus with diabetic polyneuropathy: Secondary | ICD-10-CM | POA: Diagnosis not present

## 2021-08-20 DIAGNOSIS — L84 Corns and callosities: Secondary | ICD-10-CM

## 2021-08-20 DIAGNOSIS — G600 Hereditary motor and sensory neuropathy: Secondary | ICD-10-CM

## 2021-08-20 DIAGNOSIS — Q667 Congenital pes cavus, unspecified foot: Secondary | ICD-10-CM

## 2021-08-20 NOTE — Progress Notes (Signed)
This patient presents to the office with pain under the outside ball of left foot.  Patient was treated with debridement and injection.  Problem persists. Patient also has painful callus under big toe joint left foot. He presents to the office for evaluation and treatment.  Vascular  Dorsalis pedis and posterior tibial pulses are palpable  B/L.  Capillary return  WNL.  Temperature gradient is  WNL.  Skin turgor  WNL  Sensorium  Senn Weinstein monofilament wire  WNL. Normal tactile sensation. Drop foot left.  Cavus foot type.  Plantar flexed fifth metatarsal left foot.  Nail Exam  Patient has normal nails with no evidence of bacterial or fungal infection.  Orthopedic  Exam  Muscle tone and muscle strength  WNL.  No limitations of motion feet  B/L.  No crepitus or joint effusion noted.  Foot type is unremarkable and digits show no abnormalities.  Bony prominences are unremarkable.  Skin  No open lesions.  Normal skin texture and turgor.   Callus 1.5 left foot.  Callus left foot.  Debride callus 1.5 left wiith # 15 blade and dremel tool.  RTC prn.  Discussed surgical correction with patient.   Gardiner Barefoot DPM

## 2021-09-02 ENCOUNTER — Ambulatory Visit: Payer: Medicare Other | Admitting: Urology

## 2021-09-03 ENCOUNTER — Ambulatory Visit: Payer: Medicare Other | Admitting: Urology

## 2021-09-05 ENCOUNTER — Other Ambulatory Visit: Payer: Self-pay | Admitting: Internal Medicine

## 2021-09-05 DIAGNOSIS — I10 Essential (primary) hypertension: Secondary | ICD-10-CM

## 2021-09-05 DIAGNOSIS — E119 Type 2 diabetes mellitus without complications: Secondary | ICD-10-CM

## 2021-09-05 DIAGNOSIS — E782 Mixed hyperlipidemia: Secondary | ICD-10-CM

## 2021-09-09 ENCOUNTER — Ambulatory Visit: Payer: Medicare Other | Admitting: Podiatry

## 2021-09-15 ENCOUNTER — Ambulatory Visit (INDEPENDENT_AMBULATORY_CARE_PROVIDER_SITE_OTHER): Payer: Medicare Other | Admitting: Internal Medicine

## 2021-09-15 ENCOUNTER — Encounter: Payer: Self-pay | Admitting: Internal Medicine

## 2021-09-15 VITALS — BP 163/78 | HR 70 | Temp 97.5°F | Wt 273.6 lb

## 2021-09-15 DIAGNOSIS — I10 Essential (primary) hypertension: Secondary | ICD-10-CM | POA: Diagnosis not present

## 2021-09-15 MED ORDER — AMLODIPINE BESYLATE 5 MG PO TABS
5.0000 mg | ORAL_TABLET | Freq: Every day | ORAL | 1 refills | Status: DC
Start: 2021-09-15 — End: 2021-11-24

## 2021-09-15 NOTE — Progress Notes (Signed)
Established Patient Office Visit     CC/Reason for Visit: Follow-up blood pressure  HPI: Ralph Garrett is a 76 y.o. male who is coming in today for the above mentioned reasons. Past Medical History is significant for: Hypertension, hyperlipidemia, type 2 diabetes, obesity, GERD, obstructive sleep apnea, BPH.  5 weeks ago he was started on Diovan HCT 160/25 mg daily and replacement for lisinopril 21 mg daily due to uncontrolled blood pressure.  He brings in ambulatory measurements that show systolics in the upper 951O to 841Y with diastolics in the 60Y to low 80s.  He is concerned about elevated blood pressure.  He will be going on a 10-day trip to [place] tomorrow.   Past Medical/Surgical History: Past Medical History:  Diagnosis Date   BPH (benign prostatic hyperplasia)    Coronary artery disease    Diabetes mellitus without complication (HCC)    Diverticulosis of colon    GERD (gastroesophageal reflux disease)    History of bacterial meningitis 1987   Hx of colonic polyps    Hypercholesteremia    no per pt   Hypertension    Increased prostate specific antigen (PSA) velocity    Meniscus tear    left knee   Obesity    Peroneal muscular atrophy    Sleep apnea    wears cpap    Past Surgical History:  Procedure Laterality Date   CHOLECYSTECTOMY     COLONOSCOPY  02/2004   Dr.Streck   KNEE ARTHROSCOPY Left 04/17/2014   Procedure: LEFT KNEE ARTHROSCOPY WITH MEDIAL MENISCAL DEBRIDEMENT  ;  Surgeon: Gearlean Alf, MD;  Location: [facility];  Service: Orthopedics;  Laterality: Left;   POLYPECTOMY     TRIGGER FINGER RELEASE  2009   right hand--Dr Burney Gauze   UPPER GASTROINTESTINAL ENDOSCOPY      Social History:  reports that he quit smoking about 22 years ago. His smoking use included cigarettes. He has a 30.00 pack-year smoking history. He has never used smokeless tobacco. He reports current alcohol use. He reports that he does not use drugs.  Allergies: Allergies   Allergen Reactions   Penicillins     REACTION: rash as a child    Family History:  Family History  Problem Relation Age of Onset   COPD Mother    Charcot-Marie-Tooth disease Mother    Heart disease Father    Hypertension Father    Prostate cancer Father        dx in his 50's   Cancer - Other Brother    Lung cancer Brother    Charcot-Marie-Tooth disease Maternal Grandmother    Colon cancer Neg Hx    Esophageal cancer Neg Hx    Stomach cancer Neg Hx    Rectal cancer Neg Hx    Liver cancer Neg Hx    Pancreatic cancer Neg Hx    Colon polyps Neg Hx      Current Outpatient Medications:    amLODipine (NORVASC) 5 MG tablet, Take 1 tablet (5 mg total) by mouth daily., Disp: 90 tablet, Rfl: 1   aspirin 81 MG tablet, Take 81 mg by mouth daily. , Disp: , Rfl:    atorvastatin (LIPITOR) 40 MG tablet, TAKE ONE TABLET BY MOUTH DAILY, Disp: 90 tablet, Rfl: 1   metFORMIN (GLUCOPHAGE) 500 MG tablet, TAKE ONE TABLET BY MOUTH TWICE A DAY WITH A MEAL, Disp: 180 tablet, Rfl: 1   tadalafil (CIALIS) 5 MG tablet, Take 1-4 tablets (5-20 mg total) by mouth  daily as needed for erectile dysfunction., Disp: 90 tablet, Rfl: 3   tamsulosin (FLOMAX) 0.4 MG CAPS capsule, Take 1 capsule (0.4 mg total) by mouth daily., Disp: 90 capsule, Rfl: 3   valsartan-hydrochlorothiazide (DIOVAN-HCT) 160-25 MG tablet, Take 1 tablet by mouth daily., Disp: 90 tablet, Rfl: 1  Review of Systems:  Constitutional: Denies fever, chills, diaphoresis, appetite change and fatigue.  HEENT: Denies photophobia, eye pain, redness, hearing loss, ear pain, congestion, sore throat, rhinorrhea, sneezing, mouth sores, trouble swallowing, neck pain, neck stiffness and tinnitus.   Respiratory: Denies SOB, DOE, cough, chest tightness,  and wheezing.   Cardiovascular: Denies chest pain, palpitations and leg swelling.  Gastrointestinal: Denies nausea, vomiting, abdominal pain, diarrhea, constipation, blood in stool and abdominal distention.   Genitourinary: Denies dysuria, urgency, frequency, hematuria, flank pain and difficulty urinating.  Endocrine: Denies: hot or cold intolerance, sweats, changes in hair or nails, polyuria, polydipsia. Musculoskeletal: Denies myalgias, back pain, joint swelling, arthralgias and gait problem.  Skin: Denies pallor, rash and wound.  Neurological: Denies dizziness, seizures, syncope, weakness, light-headedness, numbness and headaches.  Hematological: Denies adenopathy. Easy bruising, personal or family bleeding history  Psychiatric/Behavioral: Denies suicidal ideation, mood changes, confusion, nervousness, sleep disturbance and agitation    Physical Exam: Vitals:   09/15/21 1013 09/15/21 1017  BP: (!) 160/78 (!) 163/78  Pulse: 70   Temp: (!) 97.5 F (36.4 C)   TempSrc: Oral   SpO2: 98%   Weight: 273 lb 9.6 oz (124.1 kg)     Body mass index is 35.13 kg/m.   Constitutional: NAD, calm, comfortable Eyes: PERRL, lids and conjunctivae normal, wears corrective lenses ENMT: Mucous membranes are moist. Respiratory: clear to auscultation bilaterally, no wheezing, no crackles. Normal respiratory effort. No accessory muscle use.  Cardiovascular: Regular rate and rhythm, no murmurs / rubs / gallops. No extremity edema. Psychiatric: Normal judgment and insight. Alert and oriented x 3. Normal mood.    Impression and Plan:  Essential hypertension  - Plan: amLODipine (NORVASC) 5 MG tablet -Remains uncontrolled.  Continue Diovan HCT, add amlodipine 5 mg daily, continue ambulatory blood pressure monitoring and return in 6 to 8 weeks for follow-up.    Time spent:31 minutes reviewing chart, interviewing and examining patient and formulating plan of care.      Lelon Frohlich, MD [facility]

## 2021-09-23 ENCOUNTER — Ambulatory Visit: Payer: Medicare Other | Admitting: Urology

## 2021-11-13 ENCOUNTER — Encounter: Payer: Self-pay | Admitting: Nurse Practitioner

## 2021-11-13 ENCOUNTER — Ambulatory Visit (INDEPENDENT_AMBULATORY_CARE_PROVIDER_SITE_OTHER): Payer: Medicare Other | Admitting: Nurse Practitioner

## 2021-11-13 VITALS — BP 148/78 | HR 66 | Temp 97.5°F | Resp 20 | Ht 74.0 in | Wt 280.4 lb

## 2021-11-13 DIAGNOSIS — M545 Low back pain, unspecified: Secondary | ICD-10-CM

## 2021-11-13 MED ORDER — TIZANIDINE HCL 2 MG PO CAPS: 2.0000 mg | ORAL_CAPSULE | Freq: Two times a day (BID) | ORAL | 0 refills | Status: DC | PRN

## 2021-11-13 NOTE — Progress Notes (Signed)
Careteam: Patient Care Team: [name], Rayford Halsted, MD as PCP - General (Internal Medicine)  PLACE OF SERVICE:  [facility]  Advanced Directive information    Allergies  Allergen Reactions   Penicillins     REACTION: rash as a child    Chief Complaint  Patient presents with   Acute Visit    Muscle relaxer      HPI: Patient is a 76 y.o. male here w/ pain in his back on the right lower side. Has been ongooing for about a month. Thinks it is because he pulled something doing while carrying luggage. Describes the pain as "hurt" and states it feels like it "catches" when he gets up in the morning. States this happened several years ago and was given muscle relaxer previously and wants to try it again because it knocked out the pain in a couple of days.  Takes aleve but not on a regular basis, has not helped pain.  Does not radiate down the leg. No weakness, numbness or tingling.    Review of Systems:  Review of Systems  Constitutional:  Negative for fever, malaise/fatigue and weight loss.  Gastrointestinal:  Negative for constipation.  Genitourinary:  Negative for dysuria, frequency and urgency.  Musculoskeletal:  Positive for back pain.  Neurological:  Negative for tingling and weakness.    Past Medical History:  Diagnosis Date   BPH (benign prostatic hyperplasia)    Coronary artery disease    Diabetes mellitus without complication (HCC)    Diverticulosis of colon    GERD (gastroesophageal reflux disease)    History of bacterial meningitis 1987   Hx of colonic polyps    Hypercholesteremia    no per pt   Hypertension    Increased prostate specific antigen (PSA) velocity    Meniscus tear    left knee   Obesity    Peroneal muscular atrophy    Sleep apnea    wears cpap   Past Surgical History:  Procedure Laterality Date   CHOLECYSTECTOMY     COLONOSCOPY  02/2004   Dr.Streck   KNEE ARTHROSCOPY Left 04/17/2014   Procedure: LEFT KNEE ARTHROSCOPY WITH MEDIAL  MENISCAL DEBRIDEMENT  ;  Surgeon: Gearlean Alf, MD;  Location: [facility];  Service: Orthopedics;  Laterality: Left;   POLYPECTOMY     TRIGGER FINGER RELEASE  2009   right hand--Dr Burney Gauze   UPPER GASTROINTESTINAL ENDOSCOPY     Social History:   reports that he quit smoking about 22 years ago. His smoking use included cigarettes. He has a 30.00 pack-year smoking history. He has never used smokeless tobacco. He reports current alcohol use. He reports that he does not use drugs.  Family History  Problem Relation Age of Onset   COPD Mother    Charcot-Marie-Tooth disease Mother    Heart disease Father    Hypertension Father    Prostate cancer Father        dx in his 57's   Cancer - Other Brother    Lung cancer Brother    Charcot-Marie-Tooth disease Maternal Grandmother    Colon cancer Neg Hx    Esophageal cancer Neg Hx    Stomach cancer Neg Hx    Rectal cancer Neg Hx    Liver cancer Neg Hx    Pancreatic cancer Neg Hx    Colon polyps Neg Hx     Medications: Patient's Medications  New Prescriptions   No medications on file  Previous Medications   AMLODIPINE (  NORVASC) 5 MG TABLET    Take 1 tablet (5 mg total) by mouth daily.   ASPIRIN 81 MG TABLET    Take 81 mg by mouth daily.    ATORVASTATIN (LIPITOR) 40 MG TABLET    TAKE ONE TABLET BY MOUTH DAILY   METFORMIN (GLUCOPHAGE) 500 MG TABLET    TAKE ONE TABLET BY MOUTH TWICE A DAY WITH A MEAL   TADALAFIL (CIALIS) 5 MG TABLET    Take 1-4 tablets (5-20 mg total) by mouth daily as needed for erectile dysfunction.   TAMSULOSIN (FLOMAX) 0.4 MG CAPS CAPSULE    Take 1 capsule (0.4 mg total) by mouth daily.   VALSARTAN-HYDROCHLOROTHIAZIDE (DIOVAN-HCT) 160-25 MG TABLET    Take 1 tablet by mouth daily.  Modified Medications   No medications on file  Discontinued Medications   No medications on file    Physical Exam:  Vitals:   11/13/21 1416  BP: (!) 148/78  Pulse: 66  Resp: 20  Temp: (!) 97.5 F (36.4 C)  SpO2: 97%  Weight: 127.2  kg  Height: '6\' 2"'$  (1.88 m)   Body mass index is 36 kg/m. Wt Readings from Last 3 Encounters:  11/13/21 127.2 kg  09/15/21 124.1 kg  08/17/21 125.3 kg    Physical Exam Cardiovascular:     Rate and Rhythm: Normal rate and regular rhythm.     Pulses: Normal pulses.     Heart sounds: Normal heart sounds.  Pulmonary:     Effort: Pulmonary effort is normal. No respiratory distress.     Breath sounds: Normal breath sounds.  Musculoskeletal:        General: Tenderness present. No swelling or deformity. Normal range of motion.       Back:     Labs reviewed: Basic Metabolic Panel: Recent Labs    03/12/21 0836 06/17/21 1038  NA 139 138  K 4.4 4.5  CL 103 101  CO2 30 31  GLUCOSE 114* 115*  BUN 17 16  CREATININE 1.04 0.91  CALCIUM 10.2 9.8   Liver Function Tests: Recent Labs    03/12/21 0836 06/17/21 1038  AST 18 20  ALT 16 19  ALKPHOS 61 57  BILITOT 0.3 0.4  PROT 7.4 7.2  ALBUMIN 4.4 4.5   No results for input(s): "LIPASE", "AMYLASE" in the last 8760 hours. No results for input(s): "AMMONIA" in the last 8760 hours. CBC: Recent Labs    03/12/21 0836  WBC 6.2  NEUTROABS 3.5  HGB 13.9  HCT 41.0  MCV 89.7  PLT 223.0   Lipid Panel: Recent Labs    03/12/21 0836 06/17/21 1038  CHOL 175 109  HDL 45.50 40.10  LDLCALC 103* 48  TRIG 134.0 106.0  CHOLHDL 4 3   TSH: No results for input(s): "TSH" in the last 8760 hours. A1C: Lab Results  Component Value Date   HGBA1C 6.4 (A) 06/17/2021     Assessment/Plan  1. Acute right-sided low back pain without sciatica -Aleve twice a day for five days with food - Apply heat three times daily  - Zanaflex as needed- discussed adverse effects and to use caution.  - Referral to physical therapy if no improvement - tizanidine (ZANAFLEX) 2 MG capsule; Take 1 capsule (2 mg total) by mouth 2 (two) times daily as needed for muscle spasms.  Dispense: 10 capsule; Refill: 0    Student- Waunita Schooner, RN I personally  was present during the history, physical exam and medical decision-making activities of this service and have  verified that the service and findings are accurately documented in the student's note Fuquan Wilson K. [place], [facility] [phone]

## 2021-11-13 NOTE — Patient Instructions (Addendum)
To use aleve twice daily for 5 days take with food for pain To use muscle relaxer as needed- take caution when using as it can make you fatigue.  Heating pad 2-3 times daily~ 20 mins.  If needed can place physical therapy orders

## 2021-11-24 ENCOUNTER — Encounter: Payer: Self-pay | Admitting: Internal Medicine

## 2021-11-24 ENCOUNTER — Ambulatory Visit (INDEPENDENT_AMBULATORY_CARE_PROVIDER_SITE_OTHER): Payer: Medicare Other | Admitting: Internal Medicine

## 2021-11-24 VITALS — BP 140/75 | HR 59 | Temp 97.9°F | Wt 277.2 lb

## 2021-11-24 DIAGNOSIS — I10 Essential (primary) hypertension: Secondary | ICD-10-CM

## 2021-11-24 DIAGNOSIS — E785 Hyperlipidemia, unspecified: Secondary | ICD-10-CM | POA: Diagnosis not present

## 2021-11-24 DIAGNOSIS — E1169 Type 2 diabetes mellitus with other specified complication: Secondary | ICD-10-CM | POA: Diagnosis not present

## 2021-11-24 DIAGNOSIS — E782 Mixed hyperlipidemia: Secondary | ICD-10-CM

## 2021-11-24 DIAGNOSIS — E119 Type 2 diabetes mellitus without complications: Secondary | ICD-10-CM | POA: Diagnosis not present

## 2021-11-24 DIAGNOSIS — M545 Low back pain, unspecified: Secondary | ICD-10-CM

## 2021-11-24 LAB — MICROALBUMIN / CREATININE URINE RATIO
Creatinine,U: 85.9 mg/dL
Microalb Creat Ratio: 1.3 mg/g (ref 0.0–30.0)
Microalb, Ur: 1.1 mg/dL (ref 0.0–1.9)

## 2021-11-24 LAB — POCT GLYCOSYLATED HEMOGLOBIN (HGB A1C): Hemoglobin A1C: 6.4 % — AB (ref 4.0–5.6)

## 2021-11-24 MED ORDER — METFORMIN HCL 500 MG PO TABS: ORAL_TABLET | ORAL | 1 refills | Status: DC

## 2021-11-24 MED ORDER — AMLODIPINE BESYLATE 5 MG PO TABS: 5.0000 mg | ORAL_TABLET | Freq: Every day | ORAL | 1 refills | Status: DC

## 2021-11-24 MED ORDER — ATORVASTATIN CALCIUM 40 MG PO TABS: 40.0000 mg | ORAL_TABLET | Freq: Every day | ORAL | 1 refills | Status: DC

## 2021-11-24 MED ORDER — CYCLOBENZAPRINE HCL 5 MG PO TABS: 5.0000 mg | ORAL_TABLET | Freq: Two times a day (BID) | ORAL | 0 refills | Status: DC | PRN

## 2021-11-24 MED ORDER — VALSARTAN-HYDROCHLOROTHIAZIDE 160-25 MG PO TABS: 1.0000 | ORAL_TABLET | Freq: Every day | ORAL | 1 refills | Status: DC

## 2021-11-24 NOTE — Progress Notes (Signed)
Established Patient Office Visit     CC/Reason for Visit: Follow-up chronic conditions  HPI: Ralph Garrett is a 76 y.o. male who is coming in today for the above mentioned reasons. Past Medical History is significant for: Hypertension, hyperlipidemia, type 2 diabetes, GERD, morbid obesity, OSA, BPH.  He has been doing well.  He unfortunately suffered a right lower back strain and was improved with muscle relaxers, he is requesting a refill and is considering PT.  He requests referral to medical weight loss.   Past Medical/Surgical History: Past Medical History:  Diagnosis Date   BPH (benign prostatic hyperplasia)    Coronary artery disease    Diabetes mellitus without complication (HCC)    Diverticulosis of colon    GERD (gastroesophageal reflux disease)    History of bacterial meningitis 1987   Hx of colonic polyps    Hypercholesteremia    no per pt   Hypertension    Increased prostate specific antigen (PSA) velocity    Meniscus tear    left knee   Obesity    Peroneal muscular atrophy    Sleep apnea    wears cpap    Past Surgical History:  Procedure Laterality Date   CHOLECYSTECTOMY     COLONOSCOPY  02/2004   Dr.Streck   KNEE ARTHROSCOPY Left 04/17/2014   Procedure: LEFT KNEE ARTHROSCOPY WITH MEDIAL MENISCAL DEBRIDEMENT  ;  Surgeon: Gearlean Alf, MD;  Location: [facility];  Service: Orthopedics;  Laterality: Left;   POLYPECTOMY     TRIGGER FINGER RELEASE  2009   right hand--Dr Burney Gauze   UPPER GASTROINTESTINAL ENDOSCOPY      Social History:  reports that he quit smoking about 22 years ago. His smoking use included cigarettes. He has a 30.00 pack-year smoking history. He has never used smokeless tobacco. He reports current alcohol use. He reports that he does not use drugs.  Allergies: Allergies  Allergen Reactions   Penicillins     REACTION: rash as a child    Family History:  Family History  Problem Relation Age of Onset   COPD Mother     Charcot-Marie-Tooth disease Mother    Heart disease Father    Hypertension Father    Prostate cancer Father        dx in his 58's   Cancer - Other Brother    Lung cancer Brother    Charcot-Marie-Tooth disease Maternal Grandmother    Colon cancer Neg Hx    Esophageal cancer Neg Hx    Stomach cancer Neg Hx    Rectal cancer Neg Hx    Liver cancer Neg Hx    Pancreatic cancer Neg Hx    Colon polyps Neg Hx      Current Outpatient Medications:    cyclobenzaprine (FLEXERIL) 5 MG tablet, Take 1 tablet (5 mg total) by mouth 2 (two) times daily as needed for muscle spasms., Disp: 30 tablet, Rfl: 0   amLODipine (NORVASC) 5 MG tablet, Take 1 tablet (5 mg total) by mouth daily., Disp: 90 tablet, Rfl: 1   aspirin 81 MG tablet, Take 81 mg by mouth daily. , Disp: , Rfl:    atorvastatin (LIPITOR) 40 MG tablet, Take 1 tablet (40 mg total) by mouth daily., Disp: 90 tablet, Rfl: 1   metFORMIN (GLUCOPHAGE) 500 MG tablet, TAKE ONE TABLET BY MOUTH TWICE A DAY WITH A MEAL, Disp: 180 tablet, Rfl: 1   tamsulosin (FLOMAX) 0.4 MG CAPS capsule, Take 1 capsule (0.4 mg total)  by mouth daily., Disp: 90 capsule, Rfl: 3   valsartan-hydrochlorothiazide (DIOVAN-HCT) 160-25 MG tablet, Take 1 tablet by mouth daily., Disp: 90 tablet, Rfl: 1  Review of Systems:  Constitutional: Denies fever, chills, diaphoresis, appetite change and fatigue.  HEENT: Denies photophobia, eye pain, redness, hearing loss, ear pain, congestion, sore throat, rhinorrhea, sneezing, mouth sores, trouble swallowing, neck pain, neck stiffness and tinnitus.   Respiratory: Denies SOB, DOE, cough, chest tightness,  and wheezing.   Cardiovascular: Denies chest pain, palpitations and leg swelling.  Gastrointestinal: Denies nausea, vomiting, abdominal pain, diarrhea, constipation, blood in stool and abdominal distention.  Genitourinary: Denies dysuria, urgency, frequency, hematuria, flank pain and difficulty urinating.  Endocrine: Denies: hot or cold  intolerance, sweats, changes in hair or nails, polyuria, polydipsia. Musculoskeletal: Denies  joint swelling, arthralgias and gait problem.  Skin: Denies pallor, rash and wound.  Neurological: Denies dizziness, seizures, syncope, weakness, light-headedness, numbness and headaches.  Hematological: Denies adenopathy. Easy bruising, personal or family bleeding history  Psychiatric/Behavioral: Denies suicidal ideation, mood changes, confusion, nervousness, sleep disturbance and agitation    Physical Exam: Vitals:   11/24/21 0922 11/24/21 0926 11/24/21 1001  BP: 120/78 (!) 130/90 (!) 140/75  Pulse: (!) 59    Temp: 97.9 F (36.6 C)    TempSrc: Oral    SpO2: 98%    Weight: 277 lb 2.7 oz (125.7 kg)      Body mass index is 35.59 kg/m.   Constitutional: NAD, calm, comfortable Eyes: PERRL, lids and conjunctivae normal, wears corrective lenses ENMT: Mucous membranes are moist.  Respiratory: clear to auscultation bilaterally, no wheezing, no crackles. Normal respiratory effort. No accessory muscle use.  Cardiovascular: Regular rate and rhythm, no murmurs / rubs / gallops.  Psychiatric: Normal judgment and insight. Alert and oriented x 3. Normal mood.    Impression and Plan:  Type 2 diabetes mellitus with other specified complication, without long-term current use of insulin ([place])  - Plan: POCT glycosylated hemoglobin (Hb A1C), Urine microalbumin-creatinine with uACR -A1c is well controlled at 6.4, continue metformin, check microalbumin.  Essential hypertension  - Plan: amLODipine (NORVASC) 5 MG tablet, valsartan-hydrochlorothiazide (DIOVAN-HCT) 160-25 MG tablet -Not well controlled, he will do ambulatory measurements with correct cuff size, which is large, and return in 4 to 6 weeks for follow-up.  Mixed hyperlipidemia  - Plan: atorvastatin (LIPITOR) 40 MG tablet -Last lipid panel in March 2023 with a total cholesterol of 109, triglycerides 106 and LDL 48.  Hyperlipidemia,  unspecified hyperlipidemia type  - Plan: Lipid panel  Morbid obesity ([place])   Plan: Amb Ref to Medical Weight Management -Discussed healthy lifestyle, including increased physical activity and better food choices to promote weight loss.  Acute right-sided low back pain without sciatica  - Plan: cyclobenzaprine (FLEXERIL) 5 MG tablet -He will consider PT referral and let us know to place it.    Time spent:33 minutes reviewing chart, interviewing and examining patient and formulating plan of care.    Lelon Frohlich, MD [facility] at [facility]

## 2021-12-22 DIAGNOSIS — I10 Essential (primary) hypertension: Secondary | ICD-10-CM | POA: Diagnosis not present

## 2021-12-22 DIAGNOSIS — H524 Presbyopia: Secondary | ICD-10-CM | POA: Diagnosis not present

## 2021-12-22 DIAGNOSIS — H25813 Combined forms of age-related cataract, bilateral: Secondary | ICD-10-CM | POA: Diagnosis not present

## 2021-12-22 DIAGNOSIS — H5203 Hypermetropia, bilateral: Secondary | ICD-10-CM | POA: Diagnosis not present

## 2021-12-22 DIAGNOSIS — H35033 Hypertensive retinopathy, bilateral: Secondary | ICD-10-CM | POA: Diagnosis not present

## 2021-12-22 DIAGNOSIS — H52223 Regular astigmatism, bilateral: Secondary | ICD-10-CM | POA: Diagnosis not present

## 2021-12-22 DIAGNOSIS — E119 Type 2 diabetes mellitus without complications: Secondary | ICD-10-CM | POA: Diagnosis not present

## 2021-12-22 LAB — HM DIABETES EYE EXAM

## 2022-02-18 ENCOUNTER — Other Ambulatory Visit: Payer: Self-pay | Admitting: Urology

## 2022-02-18 DIAGNOSIS — Z23 Encounter for immunization: Secondary | ICD-10-CM | POA: Diagnosis not present

## 2022-02-18 DIAGNOSIS — N138 Other obstructive and reflux uropathy: Secondary | ICD-10-CM

## 2022-02-22 ENCOUNTER — Telehealth: Payer: Self-pay | Admitting: Urology

## 2022-02-22 DIAGNOSIS — N138 Other obstructive and reflux uropathy: Secondary | ICD-10-CM

## 2022-02-22 MED ORDER — TAMSULOSIN HCL 0.4 MG PO CAPS: 0.4000 mg | ORAL_CAPSULE | Freq: Every day | ORAL | 0 refills | Status: DC

## 2022-02-22 NOTE — Telephone Encounter (Signed)
Pt called to schedule a f/u appt for med refill on 12/6 w/[facility].  He will run out of Flomax after Thanksgiving and would like a couple of weeks sent to [facility] to last til his appt.

## 2022-02-22 NOTE — Telephone Encounter (Signed)
RX sent

## 2022-03-08 ENCOUNTER — Telehealth: Payer: Self-pay | Admitting: Internal Medicine

## 2022-03-08 NOTE — Telephone Encounter (Signed)
Error. Please disregard

## 2022-03-10 ENCOUNTER — Ambulatory Visit (INDEPENDENT_AMBULATORY_CARE_PROVIDER_SITE_OTHER): Payer: Medicare Other | Admitting: Urology

## 2022-03-10 ENCOUNTER — Encounter: Payer: Self-pay | Admitting: Urology

## 2022-03-10 VITALS — BP 147/77 | HR 81 | Ht 73.0 in | Wt 275.0 lb

## 2022-03-10 DIAGNOSIS — N138 Other obstructive and reflux uropathy: Secondary | ICD-10-CM | POA: Diagnosis not present

## 2022-03-10 DIAGNOSIS — R339 Retention of urine, unspecified: Secondary | ICD-10-CM

## 2022-03-10 DIAGNOSIS — N529 Male erectile dysfunction, unspecified: Secondary | ICD-10-CM | POA: Diagnosis not present

## 2022-03-10 DIAGNOSIS — N401 Enlarged prostate with lower urinary tract symptoms: Secondary | ICD-10-CM | POA: Diagnosis not present

## 2022-03-10 LAB — BLADDER SCAN AMB NON-IMAGING

## 2022-03-10 MED ORDER — DUTASTERIDE 0.5 MG PO CAPS: 0.5000 mg | ORAL_CAPSULE | Freq: Every day | ORAL | 3 refills | Status: DC

## 2022-03-10 MED ORDER — TADALAFIL 10 MG PO TABS: 10.0000 mg | ORAL_TABLET | Freq: Every day | ORAL | 11 refills | Status: DC | PRN

## 2022-03-10 NOTE — Progress Notes (Signed)
   03/10/2022 3:09 PM   Ralph Garrett [birth year] [id number]  Reason for visit: Follow up BPH, incomplete bladder emptying, history of elevated PSA, ED  HPI: 76 year old male who transferred his care last year from Dr. Junious Silk at [facility] urology.  He is relatively healthy male with a long history of BPH, elevated PSA with 4 negative prior biopsies, and minimally bothersome urinary symptoms.  He has a long history of elevated PVRs ranging from 200-771m and had been under surveillance with Dr. EJunious Silkfor this.  He had been on dutasteride long-term, and PSA had decreased appropriately to 2.15, corrected for dutasteride 4.3 which was normal for age and stable from prior elevated values.  He is minimally bothered by urinary symptoms aside from some occasional weak stream or frequency, denies any nocturia or incontinence.  He had 1 episode of urinary retention in March 2022 precipitated by alcohol and Benadryl use.  PVR last year was 3737m and remains elevated today at 55063mhowever these are stable from his long-term PVRs over the last few years with Dr. EskJunious SilkI added Flomax last year, but he never followed up in 3 months as scheduled, and he stopped dutasteride on his own.  We also trialed Cialis 5 to 20 mg for ED which was helping.  Unfortunately his wife passed away a few months ago.  He does want a refill on the Cialis in case he needs someone.  I had a very frank conversation with the patient about his incomplete bladder emptying.  We discussed the risk of retention, UTIs, high-pressure bladder with hydronephrosis and renal damage/renal failure.  Fortunately his renal function is normal with sCr of 0.91, eGFR >60. We discussed alternatives of maximal medical therapy with Flomax and finasteride, or consideration of an outlet procedure like HOLEP.  Would need transrectal ultrasound/cystoscopy for further evaluation of prostate sizing.  He is adamantly opposed to any surgical intervention at  this time.  He understands the risk of recurrent retention, UTIs, or renal damage by deferring an outlet procedure.  He also does not want to be on 2 different medications for his prostate, but was amenable to resuming the dutasteride.  -Dutasteride refilled, patient decided to stop Flomax -Cialis filled -RTC 1 year PVR, renal ultrasound prior  BriBilley CoD  [address]uiBlue RiverrWaucondaC [id number]

## 2022-03-10 NOTE — Patient Instructions (Signed)

## 2022-03-17 ENCOUNTER — Encounter: Payer: Self-pay | Admitting: Internal Medicine

## 2022-03-23 ENCOUNTER — Encounter: Payer: Self-pay | Admitting: Internal Medicine

## 2022-03-23 ENCOUNTER — Ambulatory Visit (INDEPENDENT_AMBULATORY_CARE_PROVIDER_SITE_OTHER): Payer: Medicare Other | Admitting: Internal Medicine

## 2022-03-23 VITALS — BP 130/71 | HR 62 | Temp 97.3°F | Ht 73.0 in | Wt 276.0 lb

## 2022-03-23 DIAGNOSIS — K219 Gastro-esophageal reflux disease without esophagitis: Secondary | ICD-10-CM | POA: Diagnosis not present

## 2022-03-23 DIAGNOSIS — Z87898 Personal history of other specified conditions: Secondary | ICD-10-CM

## 2022-03-23 DIAGNOSIS — N138 Other obstructive and reflux uropathy: Secondary | ICD-10-CM | POA: Diagnosis not present

## 2022-03-23 DIAGNOSIS — E119 Type 2 diabetes mellitus without complications: Secondary | ICD-10-CM

## 2022-03-23 DIAGNOSIS — E782 Mixed hyperlipidemia: Secondary | ICD-10-CM | POA: Diagnosis not present

## 2022-03-23 DIAGNOSIS — D126 Benign neoplasm of colon, unspecified: Secondary | ICD-10-CM

## 2022-03-23 DIAGNOSIS — I10 Essential (primary) hypertension: Secondary | ICD-10-CM

## 2022-03-23 DIAGNOSIS — N401 Enlarged prostate with lower urinary tract symptoms: Secondary | ICD-10-CM

## 2022-03-23 DIAGNOSIS — Z0000 Encounter for general adult medical examination without abnormal findings: Secondary | ICD-10-CM | POA: Diagnosis not present

## 2022-03-23 DIAGNOSIS — G4733 Obstructive sleep apnea (adult) (pediatric): Secondary | ICD-10-CM

## 2022-03-23 LAB — COMPREHENSIVE METABOLIC PANEL
ALT: 19 U/L (ref 0–53)
AST: 19 U/L (ref 0–37)
Albumin: 4.6 g/dL (ref 3.5–5.2)
Alkaline Phosphatase: 69 U/L (ref 39–117)
BUN: 21 mg/dL (ref 6–23)
CO2: 30 mEq/L (ref 19–32)
Calcium: 10.1 mg/dL (ref 8.4–10.5)
Chloride: 98 mEq/L (ref 96–112)
Creatinine, Ser: 1.06 mg/dL (ref 0.40–1.50)
GFR: 67.88 mL/min (ref >60.00)
Glucose, Bld: 126 mg/dL — ABNORMAL HIGH (ref 70–99)
Potassium: 4.1 mEq/L (ref 3.5–5.1)
Sodium: 137 mEq/L (ref 135–145)
Total Bilirubin: 0.4 mg/dL (ref 0.2–1.2)
Total Protein: 7.8 g/dL (ref 6.0–8.3)

## 2022-03-23 LAB — CBC WITH DIFFERENTIAL/PLATELET
Basophils Absolute: 0.1 10*3/uL (ref 0.0–0.1)
Basophils Relative: 0.7 % (ref 0.0–3.0)
Eosinophils Absolute: 0.3 10*3/uL (ref 0.0–0.7)
Eosinophils Relative: 4.3 % (ref 0.0–5.0)
HCT: 40.5 % (ref 39.0–52.0)
Hemoglobin: 14.1 g/dL (ref 13.0–17.0)
Lymphocytes Relative: 24.6 % (ref 12.0–46.0)
Lymphs Abs: 1.8 10*3/uL (ref 0.7–4.0)
MCHC: 34.8 g/dL (ref 30.0–36.0)
MCV: 89.2 fl (ref 78.0–100.0)
Monocytes Absolute: 0.7 10*3/uL (ref 0.1–1.0)
Monocytes Relative: 9.8 % (ref 3.0–12.0)
Neutro Abs: 4.5 10*3/uL (ref 1.4–7.7)
Neutrophils Relative %: 60.6 % (ref 43.0–77.0)
Platelets: 262 10*3/uL (ref 150.0–400.0)
RBC: 4.54 Mil/uL (ref 4.22–5.81)
RDW: 13.5 % (ref 11.5–15.5)
WBC: 7.4 10*3/uL (ref 4.0–10.5)

## 2022-03-23 LAB — MICROALBUMIN / CREATININE URINE RATIO
Creatinine,U: 80.2 mg/dL
Microalb Creat Ratio: 0.9 mg/g (ref 0.0–30.0)
Microalb, Ur: <0.7 mg/dL (ref 0.0–1.9)

## 2022-03-23 LAB — LIPID PANEL
Cholesterol: 104 mg/dL (ref 0–200)
HDL: 39.9 mg/dL (ref >39.00)
LDL Cholesterol: 43 mg/dL (ref 0–99)
NonHDL: 63.79
Total CHOL/HDL Ratio: 3
Triglycerides: 106 mg/dL (ref 0.0–149.0)
VLDL: 21.2 mg/dL (ref 0.0–40.0)

## 2022-03-23 LAB — HEMOGLOBIN A1C: Hgb A1c MFr Bld: 6.7 % — ABNORMAL HIGH (ref 4.6–6.5)

## 2022-03-23 MED ORDER — OZEMPIC (0.25 OR 0.5 MG/DOSE) 2 MG/3ML ~~LOC~~ SOPN: 0.5000 mg | PEN_INJECTOR | SUBCUTANEOUS | 2 refills | Status: DC

## 2022-03-23 NOTE — Progress Notes (Signed)
Established Patient Office Visit     CC/Reason for Visit: Subsequent Medicare wellness visit and follow-up chronic medical conditions  HPI: Ralph Garrett is a 76 y.o. male who is coming in today for the above mentioned reasons. Past Medical History is significant for: Hypertension, hyperlipidemia, morbid obesity, type 2 diabetes, BPH and erectile dysfunction followed by urology, GERD, OSA on CPAP.  He has routine eye and dental care.  All immunizations are up-to-date with the exception of shingles.  He had a colonoscopy in 2020 but was advised a 3-year follow-up due to polyps.  He would like to discuss weight loss.  He is considering semaglutide.  He is in need for CPAP supplies.  His pulmonologist has retired.  Unfortunately his wife passed away 3 months ago.   Past Medical/Surgical History: Past Medical History:  Diagnosis Date   BPH (benign prostatic hyperplasia)    Coronary artery disease    Diabetes mellitus without complication (HCC)    Diverticulosis of colon    GERD (gastroesophageal reflux disease)    History of bacterial meningitis 1987   Hx of colonic polyps    Hypercholesteremia    no per pt   Hypertension    Increased prostate specific antigen (PSA) velocity    Meniscus tear    left knee   Obesity    Peroneal muscular atrophy    Sleep apnea    wears cpap    Past Surgical History:  Procedure Laterality Date   CHOLECYSTECTOMY     COLONOSCOPY  02/2004   Dr.Streck   KNEE ARTHROSCOPY Left 04/17/2014   Procedure: LEFT KNEE ARTHROSCOPY WITH MEDIAL MENISCAL DEBRIDEMENT  ;  Surgeon: Gearlean Alf, MD;  Location: [facility];  Service: Orthopedics;  Laterality: Left;   POLYPECTOMY     TRIGGER FINGER RELEASE  2009   right hand--Dr Burney Gauze   UPPER GASTROINTESTINAL ENDOSCOPY      Social History:  reports that he quit smoking about 22 years ago. His smoking use included cigarettes. He has a 30.00 pack-year smoking history. He has been exposed to tobacco smoke. He  has never used smokeless tobacco. He reports current alcohol use. He reports that he does not use drugs.  Allergies: Allergies  Allergen Reactions   Penicillins     REACTION: rash as a child    Family History:  Family History  Problem Relation Age of Onset   COPD Mother    Charcot-Marie-Tooth disease Mother    Heart disease Father    Hypertension Father    Prostate cancer Father        dx in his 31's   Cancer - Other Brother    Lung cancer Brother    Charcot-Marie-Tooth disease Maternal Grandmother    Colon cancer Neg Hx    Esophageal cancer Neg Hx    Stomach cancer Neg Hx    Rectal cancer Neg Hx    Liver cancer Neg Hx    Pancreatic cancer Neg Hx    Colon polyps Neg Hx      Current Outpatient Medications:    amLODipine (NORVASC) 5 MG tablet, Take 1 tablet (5 mg total) by mouth daily., Disp: 90 tablet, Rfl: 1   aspirin 81 MG tablet, Take 81 mg by mouth daily. , Disp: , Rfl:    atorvastatin (LIPITOR) 40 MG tablet, Take 1 tablet (40 mg total) by mouth daily., Disp: 90 tablet, Rfl: 1   dutasteride (AVODART) 0.5 MG capsule, Take 1 capsule (0.5 mg total) by  mouth daily., Disp: 90 capsule, Rfl: 3   metFORMIN (GLUCOPHAGE) 500 MG tablet, TAKE ONE TABLET BY MOUTH TWICE A DAY WITH A MEAL, Disp: 180 tablet, Rfl: 1   Semaglutide,0.25 or 0.'5MG'$ /DOS, (OZEMPIC, 0.25 OR 0.5 MG/DOSE,) 2 MG/3ML SOPN, Inject 0.5 mg into the skin once a week., Disp: 3 mL, Rfl: 2   valsartan-hydrochlorothiazide (DIOVAN-HCT) 160-25 MG tablet, Take 1 tablet by mouth daily., Disp: 90 tablet, Rfl: 1  Review of Systems:  Constitutional: Denies fever, chills, diaphoresis, appetite change and fatigue.  HEENT: Denies photophobia, eye pain, redness, hearing loss, ear pain, congestion, sore throat, rhinorrhea, sneezing, mouth sores, trouble swallowing, neck pain, neck stiffness and tinnitus.   Respiratory: Denies SOB, DOE, cough, chest tightness,  and wheezing.   Cardiovascular: Denies chest pain, palpitations and leg  swelling.  Gastrointestinal: Denies nausea, vomiting, abdominal pain, diarrhea, constipation, blood in stool and abdominal distention.  Genitourinary: Denies dysuria, urgency, frequency, hematuria, flank pain and difficulty urinating.  Endocrine: Denies: hot or cold intolerance, sweats, changes in hair or nails, polyuria, polydipsia. Musculoskeletal: Denies myalgias, back pain, joint swelling, arthralgias and gait problem.  Skin: Denies pallor, rash and wound.  Neurological: Denies dizziness, seizures, syncope, weakness, light-headedness, numbness and headaches.  Hematological: Denies adenopathy. Easy bruising, personal or family bleeding history  Psychiatric/Behavioral: Denies suicidal ideation, mood changes, confusion, nervousness, sleep disturbance and agitation    Physical Exam: Vitals:   03/23/22 0931 03/23/22 0935  BP: 130/70 130/71  Pulse: 62   Temp: (!) 97.3 F (36.3 C)   TempSrc: Oral   SpO2: 97%   Weight: 276 lb (125.2 kg)   Height: '6\' 1"'$  (1.854 m)     Body mass index is 36.41 kg/m.   Constitutional: NAD, calm, comfortable Eyes: PERRL, lids and conjunctivae normal ENMT: Mucous membranes are moist. Posterior pharynx clear of any exudate or lesions. Normal dentition. Tympanic membrane is pearly white, no erythema or bulging. Neck: normal, supple, no masses, no thyromegaly Respiratory: clear to auscultation bilaterally, no wheezing, no crackles. Normal respiratory effort. No accessory muscle use.  Cardiovascular: Regular rate and rhythm, no murmurs / rubs / gallops. No extremity edema. 2+ pedal pulses. No carotid bruits.  Abdomen: no tenderness, no masses palpated. No hepatosplenomegaly. Bowel sounds positive.  Musculoskeletal: no clubbing / cyanosis. No joint deformity upper and lower extremities. Good ROM, no contractures. Normal muscle tone.  Skin: no rashes, lesions, ulcers. No induration Neurologic: CN 2-12 grossly intact. Sensation intact, DTR normal. Strength 5/5  in all 4.  Psychiatric: Normal judgment and insight. Alert and oriented x 3. Normal mood.   Subsequent Medicare wellness visit   1. Risk factors, based on past  M,S,F -cardiovascular disease risk factors include age, gender, history of hypertension, hyperlipidemia, diabetes   2.  Physical activities: Attends the gym a minimum of 3 days a week   3.  Depression/mood: Stable, not depressed   4.  Hearing: No perceived issues   5.  ADL's: Independent in all ADLs   6.  Fall risk: Low fall risk   7.  Home safety: No problems identified   8.  Height weight, and visual acuity: height and weight as above, vision:  Vision Screening   Right eye Left eye Both eyes  Without correction     With correction '20/20 20/20 20/20 '$     9.  Counseling: Advised to update immunizations and cancer screenings   10. Lab orders based on risk factors: Laboratory update will be reviewed   11. Referral : None today  12. Care plan: Follow-up with me in 3 months   13. Cognitive assessment: No cognitive impairment   14. Screening: Patient provided with a written and personalized 5-10 year screening schedule in the AVS. yes   15. Provider List Update: PCP only  16. Advance Directives: Full code   17. Opioids: Patient is not on any opioid prescriptions and has no risk factors for a substance use disorder.   [place] Office Visit from 03/23/2022 in [place] at [facility]  PHQ-9 Total Score 5          10/27/2020    4:26 PM 03/12/2021    7:52 AM 06/17/2021   11:03 AM 11/20/2021   11:59 AM 03/23/2022    9:31 AM  Fall Risk  Falls in the past year?  '1 1 1 1  '$ Was there an injury with Fall?  1 0 0 0  Fall Risk Category Calculator  '2 2 2 2  '$ Fall Risk Category  Moderate Moderate Moderate Moderate  Patient Fall Risk Level Low fall risk  Moderate fall risk  Moderate fall risk  Patient at Risk for Falls Due to   History of fall(s)  Impaired balance/gait  Fall risk Follow up   Falls  evaluation completed  Falls evaluation completed      Impression and Plan:  New onset type 2 diabetes mellitus (Uncertain) - Plan: Microalbumin/Creatinine Ratio, Urine, Hemoglobin A1c, Hemoglobin A1c, Semaglutide,0.25 or 0.'5MG'$ /DOS, (OZEMPIC, 0.25 OR 0.5 MG/DOSE,) 2 MG/3ML SOPN  Medicare annual wellness visit, subsequent  History of elevated PSA  Benign prostatic hyperplasia with urinary obstruction  Obstructive sleep apnea  Gastroesophageal reflux disease without esophagitis  Mixed hyperlipidemia - Plan: Lipid panel, Lipid panel  Essential hypertension - Plan: CBC with Differential/Platelet, Comprehensive metabolic panel, CBC with Differential/Platelet, Comprehensive metabolic panel  Benign neoplasm of colon, unspecified part of colon  Morbid obesity ([place])  -Recommend routine eye and dental care. -Immunizations: All immunizations are up-to-date with the exception of shingles, he will get later on next year -Healthy lifestyle discussed in detail. -Labs to be updated today. -Colon cancer screening: 12/2018, due for follow-up, he will contact GI -Breast cancer screening: Not applicable -Cervical cancer screening: Not applicable -Lung cancer screening: Not applicable -Prostate cancer screening: Followed by urology -DEXA: Not applicable  -Discussed healthy lifestyle, including increased physical activity and better food choices to promote weight loss. -We have decided to start Ozempic both for diabetes and for obesity.  Will start at 0.5 and titrate as tolerated. -He is requesting CPAP supplies, we will await fax from [facility], it is still unclear whether he is required to have an updated sleep study.       Lelon Frohlich, MD [facility] at [facility]

## 2022-04-16 ENCOUNTER — Telehealth: Payer: Self-pay | Admitting: Internal Medicine

## 2022-04-16 NOTE — Telephone Encounter (Signed)
Pt missed a call from office today and he is calling checking on the status of cpap supplies from [facility]

## 2022-04-16 NOTE — Telephone Encounter (Signed)
I could not find orders in chart. Please advise if manage by you.

## 2022-04-19 NOTE — Telephone Encounter (Signed)
Forms placed in Dr Ledell Noss folder

## 2022-04-20 ENCOUNTER — Telehealth: Payer: Self-pay | Admitting: *Deleted

## 2022-04-20 DIAGNOSIS — N138 Other obstructive and reflux uropathy: Secondary | ICD-10-CM

## 2022-04-20 MED ORDER — TAMSULOSIN HCL 0.4 MG PO CAPS: 0.4000 mg | ORAL_CAPSULE | Freq: Every day | ORAL | 11 refills | Status: DC

## 2022-04-20 NOTE — Telephone Encounter (Signed)
RX sent

## 2022-04-20 NOTE — Telephone Encounter (Signed)
Patient want to stop Avodart and start back on the Flomax . Please send medication to [facility] in [place]

## 2022-04-20 NOTE — Addendum Note (Signed)
Addended by: Donalee Citrin on: 04/20/2022 01:26 PM   Modules accepted: Orders

## 2022-04-20 NOTE — Telephone Encounter (Signed)
Form faxed and confirmed

## 2022-04-21 ENCOUNTER — Telehealth: Payer: Self-pay | Admitting: Internal Medicine

## 2022-04-21 NOTE — Telephone Encounter (Signed)
EJ with [facility] on Request for:  Order for CPAP supplies  Request was faxed on 04/16/22  Call back:   [phone] Fax:  [phone]

## 2022-04-21 NOTE — Telephone Encounter (Signed)
Order faxed 04/20/22

## 2022-05-14 ENCOUNTER — Encounter: Payer: Self-pay | Admitting: Internal Medicine

## 2022-05-19 ENCOUNTER — Other Ambulatory Visit: Payer: Self-pay | Admitting: Internal Medicine

## 2022-05-19 DIAGNOSIS — I10 Essential (primary) hypertension: Secondary | ICD-10-CM

## 2022-06-16 DIAGNOSIS — L218 Other seborrheic dermatitis: Secondary | ICD-10-CM | POA: Diagnosis not present

## 2022-06-16 DIAGNOSIS — D045 Carcinoma in situ of skin of trunk: Secondary | ICD-10-CM | POA: Diagnosis not present

## 2022-06-16 DIAGNOSIS — L821 Other seborrheic keratosis: Secondary | ICD-10-CM | POA: Diagnosis not present

## 2022-06-16 DIAGNOSIS — L570 Actinic keratosis: Secondary | ICD-10-CM | POA: Diagnosis not present

## 2022-06-16 DIAGNOSIS — D485 Neoplasm of uncertain behavior of skin: Secondary | ICD-10-CM | POA: Diagnosis not present

## 2022-06-22 ENCOUNTER — Ambulatory Visit (INDEPENDENT_AMBULATORY_CARE_PROVIDER_SITE_OTHER): Payer: Medicare Other | Admitting: Internal Medicine

## 2022-06-22 VITALS — BP 131/71 | HR 72 | Temp 97.4°F | Wt 258.3 lb

## 2022-06-22 DIAGNOSIS — I10 Essential (primary) hypertension: Secondary | ICD-10-CM

## 2022-06-22 DIAGNOSIS — G4733 Obstructive sleep apnea (adult) (pediatric): Secondary | ICD-10-CM | POA: Diagnosis not present

## 2022-06-22 DIAGNOSIS — K219 Gastro-esophageal reflux disease without esophagitis: Secondary | ICD-10-CM | POA: Diagnosis not present

## 2022-06-22 DIAGNOSIS — N401 Enlarged prostate with lower urinary tract symptoms: Secondary | ICD-10-CM

## 2022-06-22 DIAGNOSIS — I2510 Atherosclerotic heart disease of native coronary artery without angina pectoris: Secondary | ICD-10-CM

## 2022-06-22 DIAGNOSIS — E782 Mixed hyperlipidemia: Secondary | ICD-10-CM | POA: Diagnosis not present

## 2022-06-22 DIAGNOSIS — N138 Other obstructive and reflux uropathy: Secondary | ICD-10-CM | POA: Diagnosis not present

## 2022-06-22 DIAGNOSIS — E1169 Type 2 diabetes mellitus with other specified complication: Secondary | ICD-10-CM | POA: Diagnosis not present

## 2022-06-22 LAB — POCT GLYCOSYLATED HEMOGLOBIN (HGB A1C): Hemoglobin A1C: 6 % — AB (ref 4.0–5.6)

## 2022-06-22 MED ORDER — SEMAGLUTIDE (1 MG/DOSE) 4 MG/3ML ~~LOC~~ SOPN: 1.0000 mg | PEN_INJECTOR | SUBCUTANEOUS | 2 refills | Status: DC

## 2022-06-22 NOTE — Progress Notes (Signed)
Established Patient Office Visit     CC/Reason for Visit: Follow-up chronic conditions  HPI: Ralph Garrett is a 77 y.o. male who is coming in today for the above mentioned reasons. Past Medical History is significant for: Hypertension, hyperlipidemia, morbid obesity, type 2 diabetes, BPH and erectile dysfunction followed by urology, GERD, OSA on CPAP.  He has lost about 18 pounds on Ozempic, he is looking forward to his A1c improving as well.  He is otherwise doing well.   Past Medical/Surgical History: Past Medical History:  Diagnosis Date   BPH (benign prostatic hyperplasia)    Coronary artery disease    Diabetes mellitus without complication (HCC)    Diverticulosis of colon    GERD (gastroesophageal reflux disease)    History of bacterial meningitis 1987   Hx of colonic polyps    Hypercholesteremia    no per pt   Hypertension    Increased prostate specific antigen (PSA) velocity    Meniscus tear    left knee   Obesity    Peroneal muscular atrophy    Sleep apnea    wears cpap    Past Surgical History:  Procedure Laterality Date   CHOLECYSTECTOMY     COLONOSCOPY  02/2004   Dr.Streck   KNEE ARTHROSCOPY Left 04/17/2014   Procedure: LEFT KNEE ARTHROSCOPY WITH MEDIAL MENISCAL DEBRIDEMENT  ;  Surgeon: Gearlean Alf, MD;  Location: [facility];  Service: Orthopedics;  Laterality: Left;   POLYPECTOMY     TRIGGER FINGER RELEASE  2009   right hand--Dr Burney Gauze   UPPER GASTROINTESTINAL ENDOSCOPY      Social History:  reports that he quit smoking about 23 years ago. His smoking use included cigarettes. He has a 30.00 pack-year smoking history. He has been exposed to tobacco smoke. He has never used smokeless tobacco. He reports current alcohol use. He reports that he does not use drugs.  Allergies: Allergies  Allergen Reactions   Penicillins     REACTION: rash as a child    Family History:  Family History  Problem Relation Age of Onset   COPD Mother     Charcot-Marie-Tooth disease Mother    Heart disease Father    Hypertension Father    Prostate cancer Father        dx in his 50's   Cancer - Other Brother    Lung cancer Brother    Charcot-Marie-Tooth disease Maternal Grandmother    Colon cancer Neg Hx    Esophageal cancer Neg Hx    Stomach cancer Neg Hx    Rectal cancer Neg Hx    Liver cancer Neg Hx    Pancreatic cancer Neg Hx    Colon polyps Neg Hx      Current Outpatient Medications:    amLODipine (NORVASC) 5 MG tablet, Take 1 tablet (5 mg total) by mouth daily., Disp: 90 tablet, Rfl: 1   aspirin 81 MG tablet, Take 81 mg by mouth daily. , Disp: , Rfl:    atorvastatin (LIPITOR) 40 MG tablet, Take 1 tablet (40 mg total) by mouth daily., Disp: 90 tablet, Rfl: 1   metFORMIN (GLUCOPHAGE) 500 MG tablet, TAKE ONE TABLET BY MOUTH TWICE A DAY WITH A MEAL, Disp: 180 tablet, Rfl: 1   Semaglutide,0.25 or 0.5MG /DOS, (OZEMPIC, 0.25 OR 0.5 MG/DOSE,) 2 MG/3ML SOPN, Inject 0.5 mg into the skin once a week., Disp: 3 mL, Rfl: 2   tamsulosin (FLOMAX) 0.4 MG CAPS capsule, Take 1 capsule (0.4 mg total)  by mouth daily., Disp: 30 capsule, Rfl: 11   valsartan-hydrochlorothiazide (DIOVAN-HCT) 160-25 MG tablet, TAKE 1 TABLET BY MOUTH DAILY, Disp: 90 tablet, Rfl: 1  Review of Systems:  Negative unless indicated in HPI.   Physical Exam: Vitals:   06/22/22 0946  BP: 131/71  Pulse: 72  Temp: (!) 97.4 F (36.3 C)  TempSrc: Oral  SpO2: 98%  Weight: 258 lb 4.8 oz (117.2 kg)    Body mass index is 34.08 kg/m.   Physical Exam Vitals reviewed.  Constitutional:      Appearance: Normal appearance.  HENT:     Head: Normocephalic and atraumatic.  Eyes:     Conjunctiva/sclera: Conjunctivae normal.     Pupils: Pupils are equal, round, and reactive to light.  Cardiovascular:     Rate and Rhythm: Normal rate and regular rhythm.  Pulmonary:     Effort: Pulmonary effort is normal.     Breath sounds: Normal breath sounds.  Skin:    General: Skin is  warm and dry.  Neurological:     General: No focal deficit present.     Mental Status: He is alert and oriented to person, place, and time.  Psychiatric:        Mood and Affect: Mood normal.        Behavior: Behavior normal.        Thought Content: Thought content normal.        Judgment: Judgment normal.      Impression and Plan:  Type 2 diabetes mellitus with other specified complication, without long-term current use of insulin (HCC) - Plan: POC HgB A1c  Essential hypertension  Obstructive sleep apnea  Atherosclerosis of native coronary artery of native heart without angina pectoris  Mixed hyperlipidemia  Benign prostatic hyperplasia with urinary obstruction  Gastroesophageal reflux disease without esophagitis  -A1c is improved at 6.0.  He is requesting coming off metformin.  I think we can do this especially as we will be increasing his Ozempic dose to 1 mg.  Follow-up in 3 months. -Blood pressure is fairly well-controlled. -He continues to use his CPAP at nighttime. -LDL was at goal at 43 as of December 2023.  Time spent:31 minutes reviewing chart, interviewing and examining patient and formulating plan of care.     Lelon Frohlich, MD [facility] at [facility]

## 2022-08-24 ENCOUNTER — Other Ambulatory Visit: Payer: Self-pay | Admitting: Internal Medicine

## 2022-08-24 DIAGNOSIS — I10 Essential (primary) hypertension: Secondary | ICD-10-CM

## 2022-09-11 ENCOUNTER — Other Ambulatory Visit: Payer: Self-pay | Admitting: Internal Medicine

## 2022-09-11 DIAGNOSIS — E1169 Type 2 diabetes mellitus with other specified complication: Secondary | ICD-10-CM

## 2022-09-30 ENCOUNTER — Telehealth: Payer: Self-pay

## 2022-09-30 DIAGNOSIS — N529 Male erectile dysfunction, unspecified: Secondary | ICD-10-CM

## 2022-09-30 MED ORDER — SILDENAFIL CITRATE 100 MG PO TABS
ORAL_TABLET | ORAL | 11 refills | Status: DC
Start: 2022-09-30 — End: 2023-03-16

## 2022-09-30 NOTE — Telephone Encounter (Signed)
Message left of triage line- 1040am  Pt states his Cialis is not working.  He would like something else rxed. Last rxed 12/22 5-20 mg on demand. Not needed at [facility] in 2023.  [facility]- [facility]- 03/10/22- with [facility]- dispo- f/u 1 year   Pls advise.

## 2022-09-30 NOTE — Telephone Encounter (Signed)
Called pt informed him of the information per Dr. Richardo Hanks, pt voiced understanding and states that he would like to try sildenafil 100mg . RX sent.

## 2022-10-23 ENCOUNTER — Other Ambulatory Visit: Payer: Self-pay | Admitting: Internal Medicine

## 2022-10-23 DIAGNOSIS — E782 Mixed hyperlipidemia: Secondary | ICD-10-CM

## 2022-11-18 ENCOUNTER — Encounter (INDEPENDENT_AMBULATORY_CARE_PROVIDER_SITE_OTHER): Payer: Self-pay

## 2022-11-20 ENCOUNTER — Other Ambulatory Visit: Payer: Self-pay | Admitting: Internal Medicine

## 2022-11-20 DIAGNOSIS — I10 Essential (primary) hypertension: Secondary | ICD-10-CM

## 2022-11-25 ENCOUNTER — Other Ambulatory Visit: Payer: Self-pay | Admitting: Internal Medicine

## 2022-11-25 DIAGNOSIS — E1169 Type 2 diabetes mellitus with other specified complication: Secondary | ICD-10-CM

## 2022-12-27 ENCOUNTER — Ambulatory Visit: Payer: Medicare Other | Admitting: Internal Medicine

## 2023-01-03 ENCOUNTER — Encounter: Payer: Self-pay | Admitting: Internal Medicine

## 2023-01-03 ENCOUNTER — Ambulatory Visit (INDEPENDENT_AMBULATORY_CARE_PROVIDER_SITE_OTHER): Payer: Medicare Other | Admitting: Internal Medicine

## 2023-01-03 VITALS — BP 124/70 | HR 70 | Temp 97.6°F | Wt 232.3 lb

## 2023-01-03 DIAGNOSIS — E1169 Type 2 diabetes mellitus with other specified complication: Secondary | ICD-10-CM | POA: Diagnosis not present

## 2023-01-03 DIAGNOSIS — I10 Essential (primary) hypertension: Secondary | ICD-10-CM | POA: Diagnosis not present

## 2023-01-03 DIAGNOSIS — Z23 Encounter for immunization: Secondary | ICD-10-CM | POA: Diagnosis not present

## 2023-01-03 DIAGNOSIS — N529 Male erectile dysfunction, unspecified: Secondary | ICD-10-CM

## 2023-01-03 DIAGNOSIS — E782 Mixed hyperlipidemia: Secondary | ICD-10-CM | POA: Diagnosis not present

## 2023-01-03 LAB — POCT GLYCOSYLATED HEMOGLOBIN (HGB A1C): Hemoglobin A1C: 5.3 % (ref 4.0–5.6)

## 2023-01-03 NOTE — Addendum Note (Signed)
Addended by: Kern Reap B on: 01/03/2023 09:17 AM   Modules accepted: Orders

## 2023-01-03 NOTE — Progress Notes (Signed)
Established Patient Office Visit     CC/Reason for Visit: Follow-up chronic conditions  HPI: Ralph Garrett is a 77 y.o. male who is coming in today for the above mentioned reasons. Past Medical History is significant for: Hypertension, hyperlipidemia, type 2 diabetes, BPH, obesity, erectile dysfunction, GERD, OSA.  He continues to lose weight, has lost about 45 pounds since his diagnosis of diabetes and starting Ozempic.  He is currently on 1 mg.  He is having some reflux symptoms and abdominal bloating especially after eating a heavy meal.  He continues to have issues with erectile dysfunction.  He brings in a gold DNR form that would like me to sign.  We have discussed in detail about his wishes.  He would not like advanced airway management or CPR.   Past Medical/Surgical History: Past Medical History:  Diagnosis Date   BPH (benign prostatic hyperplasia)    Coronary artery disease    Diabetes mellitus without complication (HCC)    Diverticulosis of colon    GERD (gastroesophageal reflux disease)    History of bacterial meningitis 1987   Hx of colonic polyps    Hypercholesteremia    no per pt   Hypertension    Increased prostate specific antigen (PSA) velocity    Meniscus tear    left knee   Obesity    Peroneal muscular atrophy    Sleep apnea    wears cpap    Past Surgical History:  Procedure Laterality Date   CHOLECYSTECTOMY     COLONOSCOPY  02/2004   Dr.Streck   KNEE ARTHROSCOPY Left 04/17/2014   Procedure: LEFT KNEE ARTHROSCOPY WITH MEDIAL MENISCAL DEBRIDEMENT  ;  Surgeon: Loanne Drilling, MD;  Location: [facility];  Service: Orthopedics;  Laterality: Left;   POLYPECTOMY     TRIGGER FINGER RELEASE  2009   right hand--Dr Mina Marble   UPPER GASTROINTESTINAL ENDOSCOPY      Social History:  reports that he quit smoking about 23 years ago. His smoking use included cigarettes. He started smoking about 53 years ago. He has a 30 pack-year smoking history. He has been  exposed to tobacco smoke. He has never used smokeless tobacco. He reports current alcohol use. He reports that he does not use drugs.  Allergies: Allergies  Allergen Reactions   Penicillins     REACTION: rash as a child    Family History:  Family History  Problem Relation Age of Onset   COPD Mother    Charcot-Marie-Tooth disease Mother    Heart disease Father    Hypertension Father    Prostate cancer Father        dx in his 44's   Cancer - Other Brother    Lung cancer Brother    Charcot-Marie-Tooth disease Maternal Grandmother    Colon cancer Neg Hx    Esophageal cancer Neg Hx    Stomach cancer Neg Hx    Rectal cancer Neg Hx    Liver cancer Neg Hx    Pancreatic cancer Neg Hx    Colon polyps Neg Hx      Current Outpatient Medications:    amLODipine (NORVASC) 5 MG tablet, TAKE 1 TABLET BY MOUTH DAILY, Disp: 90 tablet, Rfl: 1   aspirin 81 MG tablet, Take 81 mg by mouth daily. , Disp: , Rfl:    atorvastatin (LIPITOR) 40 MG tablet, TAKE 1 TABLET BY MOUTH DAILY, Disp: 90 tablet, Rfl: 1   OZEMPIC, 1 MG/DOSE, 4 MG/3ML SOPN, DIAL AND INJECT  UNDER THE SKIN 1 MG WEEKLY, Disp: 3 mL, Rfl: 2   sildenafil (VIAGRA) 100 MG tablet, Take 1 tablet by mouth on an empty stomach prior to intercourse, Disp: 100 tablet, Rfl: 11   tamsulosin (FLOMAX) 0.4 MG CAPS capsule, Take 1 capsule (0.4 mg total) by mouth daily., Disp: 30 capsule, Rfl: 11   valsartan-hydrochlorothiazide (DIOVAN-HCT) 160-25 MG tablet, TAKE 1 TABLET BY MOUTH DAILY, Disp: 90 tablet, Rfl: 1  Review of Systems:  Negative unless indicated in HPI.   Physical Exam: Vitals:   01/03/23 0757  BP: 124/70  Pulse: 70  Temp: 97.6 F (36.4 C)  TempSrc: Oral  SpO2: 98%  Weight: 232 lb 4.8 oz (105.4 kg)    Body mass index is 30.65 kg/m.   Physical Exam Vitals reviewed.  Constitutional:      Appearance: Normal appearance.  HENT:     Head: Normocephalic and atraumatic.  Eyes:     Conjunctiva/sclera: Conjunctivae  normal.     Pupils: Pupils are equal, round, and reactive to light.  Cardiovascular:     Rate and Rhythm: Normal rate and regular rhythm.  Pulmonary:     Effort: Pulmonary effort is normal.     Breath sounds: Normal breath sounds.  Skin:    General: Skin is warm and dry.  Neurological:     General: No focal deficit present.     Mental Status: He is alert and oriented to person, place, and time.  Psychiatric:        Mood and Affect: Mood normal.        Behavior: Behavior normal.        Thought Content: Thought content normal.        Judgment: Judgment normal.      Impression and Plan:  Type 2 diabetes mellitus with other specified complication, without long-term current use of insulin (HCC) -     POCT glycosylated hemoglobin (Hb A1C)  Essential hypertension  Mixed hyperlipidemia  Erectile dysfunction, unspecified erectile dysfunction type   -Diabetes is well-controlled with an A1c of 5.3. -Blood pressure is well-controlled on current medication. -He is taking Flomax every other day and it is controlling BPH symptoms well. -We discussed daily dosing strategy of sildenafil for his erectile dysfunction. -We discussed potentially dose reducing Ozempic due to weight loss and GI symptoms.  He prefers to stay on current dose for now and we will discuss again in 6 months. Doylene Canning DNR form signed and copy placed in chart and chart updated per request.  Time spent:33 minutes reviewing chart, interviewing and examining patient and formulating plan of care.     Chaya Jan, MD [facility] at [facility]

## 2023-02-19 ENCOUNTER — Other Ambulatory Visit: Payer: Self-pay | Admitting: Internal Medicine

## 2023-02-19 DIAGNOSIS — I10 Essential (primary) hypertension: Secondary | ICD-10-CM

## 2023-02-28 ENCOUNTER — Ambulatory Visit (INDEPENDENT_AMBULATORY_CARE_PROVIDER_SITE_OTHER): Payer: Medicare Other | Admitting: Podiatry

## 2023-02-28 ENCOUNTER — Encounter: Payer: Self-pay | Admitting: Podiatry

## 2023-02-28 VITALS — Ht 73.0 in | Wt 232.0 lb

## 2023-02-28 DIAGNOSIS — M79675 Pain in left toe(s): Secondary | ICD-10-CM

## 2023-02-28 DIAGNOSIS — M79674 Pain in right toe(s): Secondary | ICD-10-CM

## 2023-02-28 DIAGNOSIS — E1142 Type 2 diabetes mellitus with diabetic polyneuropathy: Secondary | ICD-10-CM

## 2023-02-28 DIAGNOSIS — Q667 Congenital pes cavus, unspecified foot: Secondary | ICD-10-CM | POA: Diagnosis not present

## 2023-02-28 DIAGNOSIS — B351 Tinea unguium: Secondary | ICD-10-CM | POA: Diagnosis not present

## 2023-02-28 DIAGNOSIS — Z0189 Encounter for other specified special examinations: Secondary | ICD-10-CM

## 2023-02-28 DIAGNOSIS — G600 Hereditary motor and sensory neuropathy: Secondary | ICD-10-CM

## 2023-02-28 DIAGNOSIS — L84 Corns and callosities: Secondary | ICD-10-CM

## 2023-02-28 DIAGNOSIS — E119 Type 2 diabetes mellitus without complications: Secondary | ICD-10-CM

## 2023-02-28 NOTE — Progress Notes (Signed)
ANNUAL DIABETIC FOOT EXAM  Subjective: Ralph Garrett presents today for annual diabetic foot exam. He was recently diagnosed with diabetes. He also has CMT disease.  Chief Complaint  Patient presents with   Diabetes    Patient is here for RFC; Last A1C: 6.0( 06/22/2022)   Patient confirms h/o diabetes.  Patient denies any h/o foot wounds.  Ralph Garrett, Ralph Patricia, MD is patient's PCP.  Past Medical History:  Diagnosis Date   BPH (benign prostatic hyperplasia)    Coronary artery disease    Diabetes mellitus without complication (HCC)    Diverticulosis of colon    GERD (gastroesophageal reflux disease)    History of bacterial meningitis 1987   Hx of colonic polyps    Hypercholesteremia    no per pt   Hypertension    Increased prostate specific antigen (PSA) velocity    Meniscus tear    left knee   Obesity    Peroneal muscular atrophy    Sleep apnea    wears cpap   Patient Active Problem List   Diagnosis Date Noted   New onset type 2 diabetes mellitus (HCC) 08/17/2018   Hyperglycemia 08/16/2018   History of elevated PSA 08/16/2017   Osteoarthritis of left knee 07/20/2017   Shingles 07/25/2014   Acute medial meniscal tear 04/16/2014   Hyperlipidemia 12/25/2012   Overweight 12/25/2012   Rash 11/26/2011   Charcot-Marie-Tooth disease 06/03/2011   History of colonic polyps 04/12/2011   BACTERIAL MENINGITIS 04/30/2008   COLONIC POLYPS 06/15/2007   Obstructive sleep apnea 06/15/2007   Coronary atherosclerosis 06/15/2007   Diverticulosis of large intestine 06/15/2007   Essential hypertension 06/14/2007   GERD 06/14/2007   Osteoarthritis 06/14/2007   Benign prostatic hyperplasia with urinary obstruction 06/14/2007   Past Surgical History:  Procedure Laterality Date   CHOLECYSTECTOMY     COLONOSCOPY  02/2004   Dr.Streck   KNEE ARTHROSCOPY Left 04/17/2014   Procedure: LEFT KNEE ARTHROSCOPY WITH MEDIAL MENISCAL DEBRIDEMENT  ;  Surgeon: Loanne Drilling, MD;   Location: [facility];  Service: Orthopedics;  Laterality: Left;   POLYPECTOMY     TRIGGER FINGER RELEASE  2009   right hand--Dr Mina Marble   UPPER GASTROINTESTINAL ENDOSCOPY     Current Outpatient Medications on File Prior to Visit  Medication Sig Dispense Refill   amLODipine (NORVASC) 5 MG tablet TAKE 1 TABLET BY MOUTH DAILY 90 tablet 1   aspirin 81 MG tablet Take 81 mg by mouth daily.      atorvastatin (LIPITOR) 40 MG tablet TAKE 1 TABLET BY MOUTH DAILY 90 tablet 1   OZEMPIC, 1 MG/DOSE, 4 MG/3ML SOPN DIAL AND INJECT UNDER THE SKIN 1 MG WEEKLY 3 mL 2   sildenafil (VIAGRA) 100 MG tablet Take 1 tablet by mouth on an empty stomach prior to intercourse 100 tablet 11   tamsulosin (FLOMAX) 0.4 MG CAPS capsule Take 1 capsule (0.4 mg total) by mouth daily. 30 capsule 11   valsartan-hydrochlorothiazide (DIOVAN-HCT) 160-25 MG tablet TAKE 1 TABLET BY MOUTH DAILY 90 tablet 1   No current facility-administered medications on file prior to visit.    Allergies  Allergen Reactions   Penicillins     REACTION: rash as a child   Social History   Occupational History   Not on file  Tobacco Use   Smoking status: Former    Current packs/day: 0.00    Average packs/day: 1 pack/day for 30.0 years (30.0 ttl pk-yrs)    Types: Cigarettes  Start date: 04/05/1969    Quit date: 04/06/1999    Years since quitting: 23.9    Passive exposure: Past   Smokeless tobacco: Never  Vaping Use   Vaping status: Never Used  Substance and Sexual Activity   Alcohol use: Yes    Comment: social   Drug use: No   Sexual activity: Not on file   Family History  Problem Relation Age of Onset   COPD Mother    Charcot-Marie-Tooth disease Mother    Heart disease Father    Hypertension Father    Prostate cancer Father        dx in his 1's   Cancer - Other Brother    Lung cancer Brother    Charcot-Marie-Tooth disease Maternal Grandmother    Colon cancer Neg Hx    Esophageal cancer Neg Hx    Stomach cancer Neg Hx     Rectal cancer Neg Hx    Liver cancer Neg Hx    Pancreatic cancer Neg Hx    Colon polyps Neg Hx    Immunization History  Administered Date(s) Administered   Fluad Quad(high Dose 65+) 03/12/2021   Fluad Trivalent(High Dose 65+) 01/03/2023   Influenza Split 02/04/2011, 01/11/2012, 01/27/2013   Influenza Whole 01/17/2008   Influenza, High Dose Seasonal PF 04/07/2014, 12/20/2016, 02/21/2022   Influenza,inj,Quad PF,6+ Mos 12/12/2015   Influenza,inj,quad, With Preservative 01/03/2017   Influenza-Unspecified 12/24/2013, 01/06/2015, 01/03/2018   Moderna Sars-Covid-2 Vaccination 05/06/2019, 06/03/2019, 02/07/2020, 09/05/2020, 12/29/2020   Pfizer Covid-19 Vaccine Bivalent Booster 5y-11y 02/21/2022   Pneumococcal Conjugate-13 04/11/2018   Pneumococcal Polysaccharide-23 09/11/2019   Tdap 01/06/2015   Zoster, Live 04/30/2008     Review of Systems: Negative except as noted in the HPI.   Objective: There were no vitals filed for this visit.  Ralph Garrett is a pleasant 77 y.o. male in NAD. AAO X 3.  Title   Diabetic Foot Exam - detailed Date & Time: 02/28/2023 10:00 AM Diabetic Foot exam was performed with the following findings: Yes  Visual Foot Exam completed.: Yes  Is there a history of foot ulcer?: No Is there a foot ulcer now?: No Is there swelling?: No Is there elevated skin temperature?: No Is there abnormal foot shape?: Yes (Comment: Pes cavus bilaterally) Is there a claw toe deformity?: Yes Are the toenails long?: Yes Are the toenails thick?: Yes Are the toenails ingrown?: Yes (Comment: Left great toe) Is the skin thin, fragile, shiny and hairless?": No Normal Range of Motion?: Yes Is there foot or ankle muscle weakness?: Yes Do you have pain in calf while walking?: Yes Are the shoes appropriate in style and fit?: Yes Can the patient see the bottom of their feet?: Yes Pulse Foot Exam completed.: Yes   Right Posterior Tibialis: Present Left posterior Tibialis: Present    Right Dorsalis Pedis: Present Left Dorsalis Pedis: Present     Sensory Foot Exam Completed.: Yes Semmes-Weinstein Monofilament Test "+" means "has sensation" and "-" means "no sensation"  R Foot Test Control: Pos L Foot Test Control: Pos   R Site 1-Great Toe: Pos L Site 1-Great Toe: Pos   R Site 4: Pos L Site 4: Pos   R site 5: Pos L Site 5: Pos  R Site 6: Pos L Site 6: Pos     Image components are not supported.   Image components are not supported. Image components are not supported.  Tuning Fork Right vibratory: present Left vibratory: present  Comments Hyperkeratotic lesion(s) submet head 1  b/l and submet head 5 b/l.  No erythema, no edema, no drainage, no fluctuance.      Lab Results  Component Value Date   HGBA1C 5.3 01/03/2023   ADA Risk Categorization: Low Risk :  Patient has all of the following: Intact protective sensation No prior foot ulcer  No severe deformity Pedal pulses present  Assessment: 1. Pain due to onychomycosis of toenails of both feet   2. Callus   3. Pes cavus   4. Charcot-Marie-Tooth disease   5. Diabetic peripheral neuropathy associated with type 2 diabetes mellitus (HCC)   6. Encounter for diabetic foot exam (HCC)     Plan: -Consent given for treatment as described below: -Examined patient. -Diabetic foot examination performed today. -Continue diabetic foot care principles: inspect feet daily, monitor glucose as recommended by PCP and/or Endocrinologist, and follow prescribed diet per PCP, Endocrinologist and/or dietician. -Continue supportive shoe gear daily. -Toenails 1-5 b/l were debrided in length and girth with sterile nail nippers and dremel without iatrogenic bleeding.  -Callus(es) submet head 1 b/l and submet head 5 b/l pared utilizing sterile scalpel blade without complication or incident. Total number debrided =4. -Patient/POA to call should there be question/concern in the interim. Return in about 3 months (around  05/31/2023).  Freddie Breech, DPM      [place] LOCATION: [address], [place] [address]                   Office [phone]   [facility] LOCATION: [address] [place], [place] [address] Office [phone]

## 2023-03-02 ENCOUNTER — Other Ambulatory Visit: Payer: Self-pay | Admitting: Internal Medicine

## 2023-03-02 DIAGNOSIS — E1169 Type 2 diabetes mellitus with other specified complication: Secondary | ICD-10-CM

## 2023-03-14 ENCOUNTER — Telehealth: Payer: Self-pay | Admitting: Internal Medicine

## 2023-03-14 NOTE — Telephone Encounter (Signed)
Checking on progress of form for CPAP supply form faxed on 03/07/23 checking on status

## 2023-03-15 NOTE — Telephone Encounter (Signed)
Left message on machine for patient to return my call. Have received form. What is his PAP Pressure Settings?

## 2023-03-16 ENCOUNTER — Ambulatory Visit: Payer: Medicare Other | Admitting: Urology

## 2023-03-16 ENCOUNTER — Encounter: Payer: Self-pay | Admitting: Urology

## 2023-03-16 VITALS — BP 129/70 | HR 77 | Ht 73.0 in | Wt 235.0 lb

## 2023-03-16 DIAGNOSIS — R339 Retention of urine, unspecified: Secondary | ICD-10-CM | POA: Diagnosis not present

## 2023-03-16 DIAGNOSIS — N529 Male erectile dysfunction, unspecified: Secondary | ICD-10-CM | POA: Diagnosis not present

## 2023-03-16 DIAGNOSIS — N401 Enlarged prostate with lower urinary tract symptoms: Secondary | ICD-10-CM | POA: Diagnosis not present

## 2023-03-16 DIAGNOSIS — N138 Other obstructive and reflux uropathy: Secondary | ICD-10-CM

## 2023-03-16 MED ORDER — TAMSULOSIN HCL 0.4 MG PO CAPS: 0.4000 mg | ORAL_CAPSULE | Freq: Every day | ORAL | 11 refills | Status: DC

## 2023-03-16 MED ORDER — TADALAFIL 10 MG PO TABS: 10.0000 mg | ORAL_TABLET | Freq: Every day | ORAL | 11 refills | Status: DC

## 2023-03-16 NOTE — Progress Notes (Signed)
   03/16/2023 1:07 PM   Ralph Garrett 06-Sep-1945 [id number]  Reason for visit: Follow up BPH and incomplete bladder emptying, history of elevated PSA, ED  HPI: 77 year old male who was previously followed by Dr. Mena Goes at [facility] urology, but transitioned his care closer to home in December 2022 to [facility] urology.    He has a long history of elevated PSA with 4 prior negative biopsies, PSA decreased appropriately on dutasteride to 2.15, corrected 4.3 which is normal for his age and stable from prior values.  He opted to discontinue PSA screening per the guideline recommendations.  He has chronic incomplete bladder emptying with elevated PVRs ranging from 200-782ml but with minimal symptoms like weak stream and urinary frequency.  He has been on maximal medical therapy previously with finasteride and Flomax, he has deferred outlet procedures, and is very averse to surgery.  Renal function has been normal.  I recommended a renal/bladder ultrasound to evaluate for any hydronephrosis, but this was never completed.  PVR today remains elevated at .  He continues to deny any bothersome urinary symptoms.  Currently taking only the Flomax.  He would like to continue surveillance, we again reviewed at length return precautions including retention, UTIs, or worsening urinary symptoms.  Will follow-up repeat renal function with PCP next month.  His wife passed away a few years ago and has recently been sexually active.  He originally was using Cialis 20 mg with minimal to moderate results, and was transitioned to Viagra 100 mg on demand in June 2024.  He had no improvement on the Viagra.  He would like to go back to the Cialis.  We discussed he could take this in multiple ways, but I recommended taking 10 mg daily with a 10 mg boost dose as needed.  We also discussed other options like penile injections and he was not interested at this time.  -Continue Flomax, refilled -No further PSA screening  recommended per guideline recommendations -Will follow-up renal function with PCP next month, if significant change would recommend renal ultrasound to evaluate for hydronephrosis -Cialis refilled, okay to set up follow-up with PA for penile injection teaching in the future if he desires -RTC 1 year PVR  Sondra Come, MD  [facility] [address], [address] [place], [place] [address] [phone]

## 2023-03-16 NOTE — Patient Instructions (Addendum)
Hydrochlorothiazide can cause problems with erections, you can discuss with your PCP alternative blood pressure medications that do not have a negative impact on erections   Erectile Dysfunction Erectile dysfunction (ED) is the inability to get or keep an erection in order to have sexual intercourse. ED is considered a symptom of an underlying disorder and is not considered a disease. ED may include: Inability to get an erection. Lack of enough hardness of the erection to allow penetration. Loss of erection before sex is finished. What are the causes? This condition may be caused by: Physical causes, such as: Artery problems. This may include heart disease, high blood pressure, atherosclerosis, and diabetes. Hormonal problems, such as low testosterone. Obesity. Nerve problems. This may include back or pelvic injuries, multiple sclerosis, Parkinson's disease, spinal cord injury, and stroke. Certain medicines, such as: Pain relievers. Antidepressants. Blood pressure medicines and water pills (diuretics). Cancer medicines. Antihistamines. Muscle relaxants. Lifestyle factors, such as: Use of drugs such as marijuana, cocaine, or opioids. Excessive use of alcohol. Smoking. Lack of physical activity or exercise. Psychological causes, such as: Anxiety or stress. Sadness or depression. Exhaustion. Fear about sexual performance. Guilt. What are the signs or symptoms? Symptoms of this condition include: Inability to get an erection. Lack of enough hardness of the erection to allow penetration. Loss of the erection before sex is finished. Sometimes having normal erections, but with frequent unsatisfactory episodes. Low sexual satisfaction in either partner due to erection problems. A curved penis occurring with erection. The curve may cause pain, or the penis may be too curved to allow for intercourse. Never having nighttime or morning erections. How is this diagnosed? This condition  is often diagnosed by: Performing a physical exam to find other diseases or specific problems with the penis. Asking you detailed questions about the problem. Doing tests, such as: Blood tests to check for diabetes mellitus or high cholesterol, or to measure hormone levels. Other tests to check for underlying health conditions. An ultrasound exam to check for scarring. A test to check blood flow to the penis. Doing a sleep study at home to measure nighttime erections. How is this treated? This condition may be treated by: Medicines, such as: Medicine taken by mouth to help you achieve an erection (oral medicine). Hormone replacement therapy to replace low testosterone levels. Medicine that is injected into the penis. Your health care provider may instruct you how to give yourself these injections at home. Medicine that is delivered with a short applicator tube. The tube is inserted into the opening at the tip of the penis, which is the opening of the urethra. A tiny pellet of medicine is put in the urethra. The pellet dissolves and enhances erectile function. This is also called MUSE (medicated urethral system for erections) therapy. Vacuum pump. This is a pump with a ring on it. The pump and ring are placed on the penis and used to create pressure that helps the penis become erect. Penile implant surgery. In this procedure, you may receive: An inflatable implant. This consists of cylinders, a pump, and a reservoir. The cylinders can be inflated with a fluid that helps to create an erection, and they can be deflated after intercourse. A semi-rigid implant. This consists of two silicone rubber rods. The rods provide some rigidity. They are also flexible, so the penis can both curve downward in its normal position and become straight for sexual intercourse. Blood vessel surgery to improve blood flow to the penis. During this procedure, a blood  vessel from a different part of the body is placed into  the penis to allow blood to flow around (bypass) damaged or blocked blood vessels. Lifestyle changes, such as exercising more, losing weight, and quitting smoking. Follow these instructions at home: Medicines  Take over-the-counter and prescription medicines only as told by your health care provider. Do not increase the dosage without first discussing it with your health care provider. If you are using self-injections, do injections as directed by your health care provider. Make sure you avoid any veins that are on the surface of the penis. After giving an injection, apply pressure to the injection site for 5 minutes. Talk to your health care provider about how to prevent headaches while taking ED medicines. These medicines may cause a sudden headache due to the increase in blood flow in your body. General instructions Exercise regularly, as directed by your health care provider. Work with your health care provider to lose weight, if needed. Do not use any products that contain nicotine or tobacco. These products include cigarettes, chewing tobacco, and vaping devices, such as e-cigarettes. If you need help quitting, ask your health care provider. Before using a vacuum pump, read the instructions that come with the pump and discuss any questions with your health care provider. Keep all follow-up visits. This is important. Contact a health care provider if: You feel nauseous. You are vomiting. You get sudden headaches while taking ED medicines. You have any concerns about your sexual health. Get help right away if: You are taking oral or injectable medicines and you have an erection that lasts longer than 4 hours. If your health care provider is unavailable, go to the nearest emergency room for evaluation. An erection that lasts much longer than 4 hours can result in permanent damage to your penis. You have severe pain in your groin or abdomen. You develop redness or severe swelling of your  penis. You have redness spreading at your groin or lower abdomen. You are unable to urinate. You experience chest pain or a rapid heartbeat (palpitations) after taking oral medicines. These symptoms may represent a serious problem that is an emergency. Do not wait to see if the symptoms will go away. Get medical help right away. Call your local emergency services (911 in the [place] Do not drive yourself to the hospital. Summary Erectile dysfunction (ED) is the inability to get or keep an erection during sexual intercourse. This condition is diagnosed based on a physical exam, your symptoms, and tests to determine the cause. Treatment varies depending on the cause and may include medicines, hormone therapy, surgery, or a vacuum pump. You may need follow-up visits to make sure that you are using your medicines or devices correctly. Get help right away if you are taking or injecting medicines and you have an erection that lasts longer than 4 hours. This information is not intended to replace advice given to you by your health care provider. Make sure you discuss any questions you have with your health care provider. Document Revised: 06/18/2020 Document Reviewed: 06/18/2020 Elsevier Patient Education  2024 [employer].

## 2023-03-16 NOTE — Telephone Encounter (Signed)
Spoke to patient and he is not aware of his settings.  Form faxed.

## 2023-03-24 DIAGNOSIS — E119 Type 2 diabetes mellitus without complications: Secondary | ICD-10-CM | POA: Diagnosis not present

## 2023-03-24 DIAGNOSIS — H53143 Visual discomfort, bilateral: Secondary | ICD-10-CM | POA: Diagnosis not present

## 2023-03-24 DIAGNOSIS — H25813 Combined forms of age-related cataract, bilateral: Secondary | ICD-10-CM | POA: Diagnosis not present

## 2023-03-24 LAB — HM DIABETES EYE EXAM

## 2023-04-05 ENCOUNTER — Encounter: Payer: Self-pay | Admitting: Nurse Practitioner

## 2023-04-05 ENCOUNTER — Ambulatory Visit: Payer: Medicare Other | Admitting: Nurse Practitioner

## 2023-04-05 VITALS — BP 134/78 | HR 72 | Temp 97.8°F | Ht 73.0 in | Wt 234.0 lb

## 2023-04-05 DIAGNOSIS — J069 Acute upper respiratory infection, unspecified: Secondary | ICD-10-CM

## 2023-04-05 NOTE — Patient Instructions (Addendum)
 Netipot or saline wash daily Plain nasal saline spray throughout the day as needed Flonase 1 spray into both nares twice daily as needed for congestion  May use tylenol  325 mg 2 tablets every 6-8 hours as needed aches and pains or sore throat humidifier in the home to help with the dry air Mucinex DM by mouth twice daily as needed for cough and chest congestion with full glass of water  Keep well hydrated Proper nutrition  Avoid forcefully blowing nose Vit c 1000 mg twice daily  Vit d 2000 units daily  Zinc 50 mg daily

## 2023-04-05 NOTE — Progress Notes (Signed)
 Careteam: Patient Care Team: Theophilus Andrews, Tully GRADE, MD as PCP - General (Internal Medicine)  Advanced Directive information    Allergies  Allergen Reactions   Penicillins Swelling    REACTION: rash as a child    Chief Complaint  Patient presents with   Acute Visit    Complains of Cough and runny nose for 2 weeks. In Office Covid Test is Negative.      HPI: Patient is a 77 y.o. male seen in today at the [facility] for runny nose and cough.  COVID test negative.  No body aches or fatigue.  Still goes to the gym.  No fever.  Taking night quil at night and mucinex during the day Went and played poker and started to have runny nose and cough  Has grandchildren who he has been around that have colds   Review of Systems:  Review of Systems  Constitutional:  Negative for chills, fever and malaise/fatigue.  HENT:  Positive for congestion and sore throat.   Respiratory:  Positive for cough and sputum production. Negative for shortness of breath and wheezing.   Cardiovascular:  Negative for chest pain.  Neurological:  Negative for dizziness and headaches.    Past Medical History:  Diagnosis Date   BPH (benign prostatic hyperplasia)    Coronary artery disease    Diabetes mellitus without complication (HCC)    Diverticulosis of colon    GERD (gastroesophageal reflux disease)    History of bacterial meningitis 1987   Hx of colonic polyps    Hypercholesteremia    no per pt   Hypertension    Increased prostate specific antigen (PSA) velocity    Meniscus tear    left knee   Obesity    Peroneal muscular atrophy    Sleep apnea    wears cpap   Past Surgical History:  Procedure Laterality Date   CHOLECYSTECTOMY     COLONOSCOPY  02/2004   Dr.Streck   KNEE ARTHROSCOPY Left 04/17/2014   Procedure: LEFT KNEE ARTHROSCOPY WITH MEDIAL MENISCAL DEBRIDEMENT  ;  Surgeon: Dempsey Melodi GAILS, MD;  Location: [facility];  Service: Orthopedics;  Laterality: Left;   POLYPECTOMY      TRIGGER FINGER RELEASE  2009   right hand--Dr Sissy   UPPER GASTROINTESTINAL ENDOSCOPY     Social History:   reports that he quit smoking about 24 years ago. His smoking use included cigarettes. He started smoking about 54 years ago. He has a 30 pack-year smoking history. He has been exposed to tobacco smoke. He has never used smokeless tobacco. He reports current alcohol use. He reports that he does not use drugs.  Family History  Problem Relation Age of Onset   COPD Mother    Charcot-Marie-Tooth disease Mother    Heart disease Father    Hypertension Father    Prostate cancer Father        dx in his 18's   Cancer - Other Brother    Lung cancer Brother    Charcot-Marie-Tooth disease Maternal Grandmother    Colon cancer Neg Hx    Esophageal cancer Neg Hx    Stomach cancer Neg Hx    Rectal cancer Neg Hx    Liver cancer Neg Hx    Pancreatic cancer Neg Hx    Colon polyps Neg Hx     Medications: Patient's Medications  New Prescriptions   No medications on file  Previous Medications   AMLODIPINE  (NORVASC ) 5 MG TABLET  TAKE 1 TABLET BY MOUTH DAILY   ASPIRIN 81 MG TABLET    Take 81 mg by mouth daily.    ATORVASTATIN  (LIPITOR) 40 MG TABLET    TAKE 1 TABLET BY MOUTH DAILY   GUAIFENESIN (MUCINEX) 600 MG 12 HR TABLET    Take 600 mg by mouth 2 (two) times daily.   GUAIFENESIN (ROBITUSSIN) 100 MG/5ML SYRUP    Take 200 mg by mouth 2 (two) times daily.   OZEMPIC , 1 MG/DOSE, 4 MG/3ML SOPN    DIAL AND INJECT UNDER THE SKIN 1 MG WEEKLY   TADALAFIL  (CIALIS ) 10 MG TABLET    Take 1 tablet (10 mg total) by mouth daily. Take 10 mg daily, with an additional 10 mg boost dose 30 to 45 minutes prior to sexual activity.  This medication can be taken with or without food   TAMSULOSIN  (FLOMAX ) 0.4 MG CAPS CAPSULE    Take 1 capsule (0.4 mg total) by mouth daily.   VALSARTAN -HYDROCHLOROTHIAZIDE  (DIOVAN -HCT) 160-25 MG TABLET    TAKE 1 TABLET BY MOUTH DAILY  Modified Medications   No medications on  file  Discontinued Medications   No medications on file    Physical Exam:  Vitals:   04/05/23 1004  BP: 134/78  Pulse: 72  Temp: 97.8 F (36.6 C)  SpO2: 99%  Weight: 234 lb (106.1 kg)  Height: 6' 1 (1.854 m)   Body mass index is 30.87 kg/m. Wt Readings from Last 3 Encounters:  04/05/23 234 lb (106.1 kg)  03/16/23 235 lb (106.6 kg)  02/28/23 232 lb (105.2 kg)    Physical Exam Constitutional:      General: He is not in acute distress.    Appearance: He is well-developed. He is not diaphoretic.  HENT:     Head: Normocephalic and atraumatic.     Right Ear: Tympanic membrane, ear canal and external ear normal.     Left Ear: Tympanic membrane, ear canal and external ear normal.     Mouth/Throat:     Pharynx: No oropharyngeal exudate.  Eyes:     Conjunctiva/sclera: Conjunctivae normal.     Pupils: Pupils are equal, round, and reactive to light.  Cardiovascular:     Rate and Rhythm: Normal rate and regular rhythm.     Heart sounds: Normal heart sounds.  Pulmonary:     Effort: Pulmonary effort is normal.     Breath sounds: Normal breath sounds.  Abdominal:     General: Bowel sounds are normal.     Palpations: Abdomen is soft.  Musculoskeletal:        General: No tenderness.     Cervical back: Normal range of motion and neck supple.     Right lower leg: No edema.     Left lower leg: No edema.  Skin:    General: Skin is warm and dry.  Neurological:     Mental Status: He is alert and oriented to person, place, and time.     Labs reviewed: Basic Metabolic Panel: No results for input(s): NA, K, CL, CO2, GLUCOSE, BUN, CREATININE, CALCIUM , MG, PHOS, TSH in the last 8760 hours. Liver Function Tests: No results for input(s): AST, ALT, ALKPHOS, BILITOT, PROT, ALBUMIN in the last 8760 hours. No results for input(s): LIPASE, AMYLASE in the last 8760 hours. No results for input(s): AMMONIA in the last 8760 hours. CBC: No results  for input(s): WBC, NEUTROABS, HGB, HCT, MCV, PLT in the last 8760 hours. Lipid Panel: No results for input(s): CHOL, HDL,  LDLCALC, TRIG, CHOLHDL, LDLDIRECT in the last 8760 hours. TSH: No results for input(s): TSH in the last 8760 hours. A1C: Lab Results  Component Value Date   HGBA1C 5.3 01/03/2023     Assessment/Plan 1. Upper respiratory tract infection, unspecified type (Primary) Supportive care at this time Netipot or saline wash daily Plain nasal saline spray throughout the day as needed Flonase 1 spray into both nares twice daily as needed for congestion  May use tylenol  325 mg 2 tablets every 6-8 hours as needed aches and pains or sore throat humidifier in the home to help with the dry air Mucinex DM by mouth twice daily as needed for cough and chest congestion with full glass of water  Keep well hydrated Proper nutrition  Avoid forcefully blowing nose Vit c 1000 mg twice daily  Vit d 2000 units daily  Zinc 50 mg daily   Dacy Enrico K. Caro BODILY  [facility] [phone]

## 2023-04-07 ENCOUNTER — Telehealth: Payer: Self-pay | Admitting: Internal Medicine

## 2023-04-07 NOTE — Telephone Encounter (Signed)
 FYI: Called Pt on 04/07/23 at 9:30 am to reschedule his 04/14/23 Medicare AWV appointment. Pt agreed to reschedule this telephone visit for Thursday - 05/19/23 at 9:20 am.  (IC)

## 2023-04-14 ENCOUNTER — Encounter: Payer: Medicare Other | Admitting: Family Medicine

## 2023-05-19 ENCOUNTER — Encounter (INDEPENDENT_AMBULATORY_CARE_PROVIDER_SITE_OTHER): Payer: Medicare Other | Admitting: Family Medicine

## 2023-05-19 ENCOUNTER — Other Ambulatory Visit: Payer: Self-pay | Admitting: Internal Medicine

## 2023-05-19 DIAGNOSIS — I10 Essential (primary) hypertension: Secondary | ICD-10-CM

## 2023-05-19 NOTE — Progress Notes (Signed)
error 

## 2023-05-24 ENCOUNTER — Other Ambulatory Visit: Payer: Self-pay | Admitting: Internal Medicine

## 2023-05-24 DIAGNOSIS — E1169 Type 2 diabetes mellitus with other specified complication: Secondary | ICD-10-CM

## 2023-05-31 ENCOUNTER — Other Ambulatory Visit: Payer: Self-pay | Admitting: Internal Medicine

## 2023-05-31 DIAGNOSIS — E782 Mixed hyperlipidemia: Secondary | ICD-10-CM

## 2023-06-13 ENCOUNTER — Encounter: Payer: Self-pay | Admitting: Podiatry

## 2023-06-13 ENCOUNTER — Ambulatory Visit (INDEPENDENT_AMBULATORY_CARE_PROVIDER_SITE_OTHER): Payer: Medicare Other | Admitting: Podiatry

## 2023-06-13 DIAGNOSIS — E1142 Type 2 diabetes mellitus with diabetic polyneuropathy: Secondary | ICD-10-CM

## 2023-06-13 DIAGNOSIS — L84 Corns and callosities: Secondary | ICD-10-CM | POA: Diagnosis not present

## 2023-06-13 DIAGNOSIS — B351 Tinea unguium: Secondary | ICD-10-CM | POA: Diagnosis not present

## 2023-06-13 DIAGNOSIS — M79674 Pain in right toe(s): Secondary | ICD-10-CM | POA: Diagnosis not present

## 2023-06-13 DIAGNOSIS — M79675 Pain in left toe(s): Secondary | ICD-10-CM | POA: Diagnosis not present

## 2023-06-13 NOTE — Progress Notes (Unsigned)
 Subjective:  Patient ID: Ralph Garrett, male    DOB: Sep 25, 1945,  MRN: [id number]  Ralph Garrett presents to clinic today for at risk foot care with history of diabetic neuropathy and callus(es) of both feet and painful mycotic toenails that are difficult to trim. Painful toenails interfere with ambulation. Aggravating factors include wearing enclosed shoe gear. Pain is relieved with periodic professional debridement. Painful calluses are aggravated when weightbearing with and without shoegear. Pain is relieved with periodic professional debridement. He also has h/o Charcot Marie Tooth Disease.  Patient states he just returned back from a cruise. He states his calluses are bothering him, moreso the left foot callus under the big toe. He states he trips often, but has not fallen and has not been wearing the braces for his dropfoot.  New problem(s): None.   PCP is Ralph Garrett, Ralph Patricia, MD. Ralph Garrett 01/03/2023.  Allergies  Allergen Reactions   Penicillins Swelling    REACTION: rash as a child    Review of Systems: Negative except as noted in the HPI.  Objective: No changes noted in today's physical examination. There were no vitals filed for this visit. Ralph Garrett is a pleasant 78 y.o. male WD, WN in NAD. AAO x 3.  Vascular Examination: Capillary refill time immediate b/l. Vascular status intact b/l with palpable pedal pulses. Pedal hair present b/l. No pain with calf compression b/l. Skin temperature gradient WNL b/l. No cyanosis or clubbing b/l. No ischemia or gangrene noted b/l.   Neurological Examination: Sensation grossly intact b/l with 10 gram monofilament. Vibratory sensation intact b/l. Pt has subjective symptoms of neuropathy.  Dermatological Examination: Pedal skin with normal turgor, texture and tone b/l.  No open wounds. No interdigital macerations.   Toenails 1-5 b/l thick, discolored, elongated with subungual debris and pain on dorsal palpation.    Hyperkeratotic lesion(s) submet head 1 b/l and submet head 5 b/l.  No erythema, no edema, no drainage, no fluctuance.  Musculoskeletal Examination: Muscle strength 5/5 to all lower extremity muscle groups bilaterally. Pes cavus deformity noted b/l feet.  Radiographs: None  Last A1c:      Latest Ref Rng & Units 01/03/2023    8:10 AM 06/22/2022    9:51 AM  Hemoglobin A1C  Hemoglobin-A1c 4.0 - 5.6 % 5.3  6.0    Assessment/Plan: 1. Pain due to onychomycosis of toenails of both feet   2. Callus   3. Diabetic peripheral neuropathy associated with type 2 diabetes mellitus (HCC)    -Consent given for treatment as described below: -Examined patient. -Encouraged patient to use AFOs to avoid tripping and falling. -Patient to continue soft, supportive shoe gear daily. -Toenails 1-5 b/l were debrided in length and girth with sterile nail nippers and dremel without iatrogenic bleeding.  -Callus(es) submet head 1 b/l and submet head 5 b/l pared utilizing sterile scalpel blade without complication or incident. Total number debrided =4. -Recommended daily use of Revitaderm Cream to callus(es)/porokeratotic lesion(s) and weekly to biweekly use of pumice stone to lesions. Avoid use of sharp instruments such as razor blades or metal  -Offloaded callus(es)/porokeratotic lesion(s) submet head 1 left foot with felt dancers pad applied to insole(s) of shoe. -Patient/POA to call should there be question/concern in the interim.   Return in about 3 months (around 09/13/2023).  Ralph Garrett, DPM      [facility] LOCATION: [address]  [place], [place] [address]                   Office [phone]   [facility] LOCATION: [address] [place], [place] [address] Office [phone]

## 2023-06-16 DIAGNOSIS — L814 Other melanin hyperpigmentation: Secondary | ICD-10-CM | POA: Diagnosis not present

## 2023-06-16 DIAGNOSIS — L218 Other seborrheic dermatitis: Secondary | ICD-10-CM | POA: Diagnosis not present

## 2023-06-16 DIAGNOSIS — L821 Other seborrheic keratosis: Secondary | ICD-10-CM | POA: Diagnosis not present

## 2023-06-16 DIAGNOSIS — L570 Actinic keratosis: Secondary | ICD-10-CM | POA: Diagnosis not present

## 2023-07-04 ENCOUNTER — Encounter: Payer: Self-pay | Admitting: Internal Medicine

## 2023-07-04 ENCOUNTER — Ambulatory Visit (INDEPENDENT_AMBULATORY_CARE_PROVIDER_SITE_OTHER): Payer: Medicare Other | Admitting: Internal Medicine

## 2023-07-04 VITALS — BP 110/68 | HR 66 | Temp 98.0°F | Wt 239.2 lb

## 2023-07-04 DIAGNOSIS — N401 Enlarged prostate with lower urinary tract symptoms: Secondary | ICD-10-CM

## 2023-07-04 DIAGNOSIS — I10 Essential (primary) hypertension: Secondary | ICD-10-CM

## 2023-07-04 DIAGNOSIS — Z1159 Encounter for screening for other viral diseases: Secondary | ICD-10-CM | POA: Diagnosis not present

## 2023-07-04 DIAGNOSIS — Z7985 Long-term (current) use of injectable non-insulin antidiabetic drugs: Secondary | ICD-10-CM

## 2023-07-04 DIAGNOSIS — E1169 Type 2 diabetes mellitus with other specified complication: Secondary | ICD-10-CM | POA: Diagnosis not present

## 2023-07-04 DIAGNOSIS — G4733 Obstructive sleep apnea (adult) (pediatric): Secondary | ICD-10-CM

## 2023-07-04 DIAGNOSIS — K219 Gastro-esophageal reflux disease without esophagitis: Secondary | ICD-10-CM

## 2023-07-04 DIAGNOSIS — E782 Mixed hyperlipidemia: Secondary | ICD-10-CM | POA: Diagnosis not present

## 2023-07-04 DIAGNOSIS — N138 Other obstructive and reflux uropathy: Secondary | ICD-10-CM

## 2023-07-04 LAB — CBC WITH DIFFERENTIAL/PLATELET
Basophils Absolute: 0 10*3/uL (ref 0.0–0.1)
Basophils Relative: 0.7 % (ref 0.0–3.0)
Eosinophils Absolute: 0.4 10*3/uL (ref 0.0–0.7)
Eosinophils Relative: 6.5 % — ABNORMAL HIGH (ref 0.0–5.0)
HCT: 40.1 % (ref 39.0–52.0)
Hemoglobin: 13.5 g/dL (ref 13.0–17.0)
Lymphocytes Relative: 27.3 % (ref 12.0–46.0)
Lymphs Abs: 1.6 10*3/uL (ref 0.7–4.0)
MCHC: 33.6 g/dL (ref 30.0–36.0)
MCV: 92.3 fl (ref 78.0–100.0)
Monocytes Absolute: 0.5 10*3/uL (ref 0.1–1.0)
Monocytes Relative: 9 % (ref 3.0–12.0)
Neutro Abs: 3.4 10*3/uL (ref 1.4–7.7)
Neutrophils Relative %: 56.5 % (ref 43.0–77.0)
Platelets: 212 10*3/uL (ref 150.0–400.0)
RBC: 4.35 Mil/uL (ref 4.22–5.81)
RDW: 13.6 % (ref 11.5–15.5)
WBC: 6 10*3/uL (ref 4.0–10.5)

## 2023-07-04 LAB — POCT GLYCOSYLATED HEMOGLOBIN (HGB A1C): Hemoglobin A1C: 5.7 % — AB (ref 4.0–5.6)

## 2023-07-04 LAB — LIPID PANEL
Cholesterol: 94 mg/dL (ref 0–200)
HDL: 42.5 mg/dL (ref >39.00)
LDL Cholesterol: 32 mg/dL (ref 0–99)
NonHDL: 51.94
Total CHOL/HDL Ratio: 2
Triglycerides: 99 mg/dL (ref 0.0–149.0)
VLDL: 19.8 mg/dL (ref 0.0–40.0)

## 2023-07-04 LAB — COMPREHENSIVE METABOLIC PANEL WITH GFR
ALT: 18 U/L (ref 0–53)
AST: 21 U/L (ref 0–37)
Albumin: 4.3 g/dL (ref 3.5–5.2)
Alkaline Phosphatase: 64 U/L (ref 39–117)
BUN: 19 mg/dL (ref 6–23)
CO2: 33 meq/L — ABNORMAL HIGH (ref 19–32)
Calcium: 9.5 mg/dL (ref 8.4–10.5)
Chloride: 99 meq/L (ref 96–112)
Creatinine, Ser: 1 mg/dL (ref 0.40–1.50)
GFR: 72.14 mL/min (ref >60.00)
Glucose, Bld: 91 mg/dL (ref 70–99)
Potassium: 4.2 meq/L (ref 3.5–5.1)
Sodium: 139 meq/L (ref 135–145)
Total Bilirubin: 0.5 mg/dL (ref 0.2–1.2)
Total Protein: 6.9 g/dL (ref 6.0–8.3)

## 2023-07-04 LAB — MICROALBUMIN / CREATININE URINE RATIO
Creatinine,U: 92.8 mg/dL
Microalb Creat Ratio: 22.5 mg/g (ref 0.0–30.0)
Microalb, Ur: 2.1 mg/dL — ABNORMAL HIGH (ref 0.0–1.9)

## 2023-07-04 NOTE — Progress Notes (Signed)
 Established Patient Office Visit     CC/Reason for Visit: Follow-up chronic medical conditions  HPI: Ralph Garrett is a 78 y.o. male who is coming in today for the above mentioned reasons. Past Medical History is significant for: Hypertension, hyperlipidemia, type 2 diabetes, BPH, obesity, erectile dysfunction, GERD, OSA.  His weight loss has stabilized, he is up 5 pounds since his last visit.  He continues taking Ozempic 1 mg weekly.  He is otherwise doing quite well.   Past Medical/Surgical History: Past Medical History:  Diagnosis Date   BPH (benign prostatic hyperplasia)    Coronary artery disease    Diabetes mellitus without complication (HCC)    Diverticulosis of colon    GERD (gastroesophageal reflux disease)    History of bacterial meningitis 1987   Hx of colonic polyps    Hypercholesteremia    no per pt   Hypertension    Increased prostate specific antigen (PSA) velocity    Meniscus tear    left knee   Obesity    Peroneal muscular atrophy    Sleep apnea    wears cpap    Past Surgical History:  Procedure Laterality Date   CHOLECYSTECTOMY     COLONOSCOPY  02/2004   Dr.Streck   KNEE ARTHROSCOPY Left 04/17/2014   Procedure: LEFT KNEE ARTHROSCOPY WITH MEDIAL MENISCAL DEBRIDEMENT  ;  Surgeon: Loanne Drilling, MD;  Location: [facility];  Service: Orthopedics;  Laterality: Left;   POLYPECTOMY     TRIGGER FINGER RELEASE  2009   right hand--Dr Mina Marble   UPPER GASTROINTESTINAL ENDOSCOPY      Social History:  reports that he quit smoking about 24 years ago. His smoking use included cigarettes. He started smoking about 54 years ago. He has a 30 pack-year smoking history. He has been exposed to tobacco smoke. He has never used smokeless tobacco. He reports current alcohol use. He reports that he does not use drugs.  Allergies: Allergies  Allergen Reactions   Penicillins Swelling    REACTION: rash as a child    Family History:  Family History  Problem Relation  Age of Onset   COPD Mother    Charcot-Marie-Tooth disease Mother    Heart disease Father    Hypertension Father    Prostate cancer Father        dx in his 37's   Cancer - Other Brother    Lung cancer Brother    Charcot-Marie-Tooth disease Maternal Grandmother    Colon cancer Neg Hx    Esophageal cancer Neg Hx    Stomach cancer Neg Hx    Rectal cancer Neg Hx    Liver cancer Neg Hx    Pancreatic cancer Neg Hx    Colon polyps Neg Hx      Current Outpatient Medications:    amLODipine (NORVASC) 5 MG tablet, TAKE 1 TABLET BY MOUTH DAILY, Disp: 90 tablet, Rfl: 1   aspirin 81 MG tablet, Take 81 mg by mouth daily. , Disp: , Rfl:    atorvastatin (LIPITOR) 40 MG tablet, TAKE 1 TABLET BY MOUTH DAILY, Disp: 90 tablet, Rfl: 0   Semaglutide, 1 MG/DOSE, (OZEMPIC, 1 MG/DOSE,) 4 MG/3ML SOPN, DIAL AND INJECT UNDER THE SKIN 1 MG WEEKLY, Disp: 6 mL, Rfl: 0   tadalafil (CIALIS) 10 MG tablet, Take 1 tablet (10 mg total) by mouth daily. Take 10 mg daily, with an additional 10 mg boost dose 30 to 45 minutes prior to sexual activity.  This medication can  be taken with or without food, Disp: 30 tablet, Rfl: 11   tamsulosin (FLOMAX) 0.4 MG CAPS capsule, Take 1 capsule (0.4 mg total) by mouth daily., Disp: 30 capsule, Rfl: 11   valsartan-hydrochlorothiazide (DIOVAN-HCT) 160-25 MG tablet, TAKE 1 TABLET BY MOUTH DAILY, Disp: 90 tablet, Rfl: 0  Review of Systems:  Negative unless indicated in HPI.   Physical Exam: Vitals:   07/04/23 0757  BP: 110/68  Pulse: 66  Temp: 98 F (36.7 C)  TempSrc: Oral  SpO2: 97%  Weight: 239 lb 3.2 oz (108.5 kg)    Body mass index is 31.56 kg/m.   Physical Exam   Impression and Plan:  Type 2 diabetes mellitus with other specified complication, without long-term current use of insulin (HCC) -     POCT glycosylated hemoglobin (Hb A1C) -     Microalbumin / creatinine urine ratio; Future -     CBC with Differential/Platelet; Future -     Comprehensive metabolic panel  with GFR; Future  Essential hypertension  Mixed hyperlipidemia -     Lipid panel; Future  Gastroesophageal reflux disease without esophagitis  Benign prostatic hyperplasia with urinary obstruction  Obstructive sleep apnea  Encounter for hepatitis C screening test for low risk patient -     Hepatitis C antibody; Future   -A1c of 5.7 demonstrates excellent diabetic management. -Blood pressure is well-controlled. -Update labs today. -BPH symptoms are well-controlled.  Time spent:32 minutes reviewing chart, interviewing and examining patient and formulating plan of care.     Chaya Jan, MD [facility] at [facility]

## 2023-07-05 ENCOUNTER — Encounter: Payer: Self-pay | Admitting: Internal Medicine

## 2023-07-05 LAB — HEPATITIS C ANTIBODY: Hepatitis C Ab: NONREACTIVE

## 2023-07-12 ENCOUNTER — Telehealth: Payer: Self-pay | Admitting: Internal Medicine

## 2023-07-12 ENCOUNTER — Ambulatory Visit (INDEPENDENT_AMBULATORY_CARE_PROVIDER_SITE_OTHER): Payer: Medicare Other | Admitting: Family Medicine

## 2023-07-12 DIAGNOSIS — Z0000 Encounter for general adult medical examination without abnormal findings: Secondary | ICD-10-CM

## 2023-07-12 NOTE — Telephone Encounter (Signed)
 Good morning Dr. Rhea Belton I received a call from this patient stating that he is wanting to know weather or not he is needing to have a colonoscopy done due to his age.Patient did not want to schedule for an OV until he knows weather it is necessary to have the colonoscopy. Would you please advise on scheduling. Patient is requesting a call back. Please advise. Thank you

## 2023-07-12 NOTE — Telephone Encounter (Signed)
 Call of the clinical nature; POD A triage added  Patient with a history of multiple adenomatous colon polyps, due for surveillance The reason for the office visit would be to discuss his inclination to have colonoscopy at age 78.  It appears by his primary care notes that he has been doing well. Colonoscopy is due at this time and so if he is in agreement we can proceed without an office visit however the test does carry slightly higher than average risk at age 11.  The risks are sedation related but also a slightly higher risk of perforation.  This risk is still low but we often discussed this when people are over 33 years old. This was the purpose of an office visit but again if he is agreeable we can move to surveillance colonoscopy in the [facility]

## 2023-07-12 NOTE — Patient Instructions (Signed)
 I really enjoyed getting to talk with you today! I am available on Tuesdays and Thursdays for virtual visits if you have any questions or concerns, or if I can be of any further assistance.   CHECKLIST FROM ANNUAL WELLNESS VISIT:  -Follow up (please call to schedule if not scheduled after visit):   -yearly for annual wellness visit with primary care office  Here is a list of your preventive care/health maintenance measures and the plan for each if any are due:  PLAN For any measures below that may be due:   Please call your gastroenterologist about the colonoscopy: [phone]  If you want to do the vaccines please do so at the pharmacy, then let us know so that we can update your records.   Health Maintenance  Topic Date Due   Zoster Vaccines- Shingrix (1 of 2) 02/06/1965   COVID-19 Vaccine (8 - 2024-25 season) 12/05/2022   Colonoscopy  01/03/2024    INFLUENZA VACCINE  11/04/2023   HEMOGLOBIN A1C  01/03/2024   FOOT EXAM  02/28/2024   OPHTHALMOLOGY EXAM  03/23/2024   Diabetic kidney evaluation - eGFR measurement  07/03/2024   Diabetic kidney evaluation - Urine ACR  07/03/2024   Medicare Annual Wellness (AWV)  07/11/2024   DTaP/Tdap/Td (2 - Td or Tdap) 01/05/2025   Pneumonia Vaccine 14+ Years old  Completed   Hepatitis C Screening  Completed   HPV VACCINES  Aged Out    -See a dentist at least yearly  -Get your eyes checked and then per your eye specialist's recommendations  -Other issues addressed today:   -I have included below further information regarding a healthy whole foods based diet, physical activity guidelines for adults, stress management and opportunities for social connections. I hope you find this information useful.    -----------------------------------------------------------------------------------------------------------------------------------------------------------------------------------------------------------------------------------------------------------    NUTRITION: -eat real food: lots of colorful vegetables (half the plate) and fruits -5-7 servings of vegetables and fruits per day (fresh or steamed is best), exp. 2 servings of vegetables with lunch and dinner and 2 servings of fruit per day. Berries and greens such as kale and collards are great choices.  -consume on a regular basis:  fresh fruits, fresh veggies, fish, nuts, seeds, healthy oils (such as olive oil, avocado oil), whole grains (make sure for bread/pasta/crackers/etc., that the first ingredient on label contains the word "whole"), legumes. -can eat small amounts of dairy and lean meat (no larger than the palm of your hand), but avoid processed meats such as ham, bacon, lunch meat, etc. -drink water -try to avoid fast food and pre-packaged foods, processed meat, ultra processed foods/beverages (donuts, candy, etc.) -most experts advise limiting sodium to < 2300mg  per day, should limit further is any chronic conditions such as high blood pressure, heart disease, diabetes, etc. The American Heart Association advised that < 1500mg  is is ideal -try to avoid foods/beverages that contain any ingredients with names you do not recognize  -try to avoid foods/beverages  with added sugar or sweeteners/sweets  -try to avoid sweet drinks (including diet drinks): soda, juice, Gatorade, sweet tea, power drinks, diet drinks -try to avoid white rice, white bread, pasta (unless whole grain)  EXERCISE GUIDELINES FOR ADULTS: -if you wish to increase your physical activity, do so gradually and with the approval of your doctor -STOP and seek medical care immediately if you have any chest pain, chest discomfort or trouble breathing when starting or  increasing exercise  -move and stretch your body, legs, feet and arms when  sitting for long periods -Physical activity guidelines for optimal health in adults: -get at least 150 minutes per week of moderate exercise (can talk, but not sing); this is about 20-30 minutes of sustained activity 5-7 days per week or two 10-15 minute episodes of sustained activity 5-7 days per week -do some muscle building/resistance training/strength training at least 2 days per week  -balance exercises 3+ days per week:   Stand somewhere where you have something sturdy to hold onto if you lose balance    1) lift up on toes, then back down, start with 5x per day and work up to 20x   2) stand and lift one leg straight out to the side so that foot is a few inches of the floor, start with 5x each side and work up to 20x each side   3) stand on one foot, start with 5 seconds each side and work up to 20 seconds on each side  If you need ideas or help with getting more active:  -Silver sneakers https://tools.silversneakers.com  -Walk with a Doc: http://www.duncan-williams.com/  -try to include resistance (weight lifting/strength building) and balance exercises twice per week: or the following link for ideas: http://castillo-powell.com/  BuyDucts.dk  STRESS MANAGEMENT: -can try meditating, or just sitting quietly with deep breathing while intentionally relaxing all parts of your body for 5 minutes daily -if you need further help with stress, anxiety or depression please follow up with your primary doctor or contact the wonderful folks at [facility]: [phone]  SOCIAL CONNECTIONS: -options in [place] if you wish to engage in more social and exercise related activities:  -Silver sneakers https://tools.silversneakers.com  -Walk with a Doc: http://www.duncan-williams.com/  -Check out the [facility] Active Adults 50+  section on the [place] of [employer] (hiking clubs, book clubs, cards and games, chess, exercise classes, aquatic classes and much more) - see the website for details: https://www.[place].gov/departments/parks-recreation/active-adults50  -YouTube has lots of exercise videos for different ages and abilities as well  -[facility] (a variety of indoor and outdoor inperson activities for adults). [phone]. [address]  -Virtual Online Classes (a variety of topics): see seniorplanet.org or call [phone]  -consider volunteering at a school, hospice center, church, senior center or elsewhere

## 2023-07-12 NOTE — Progress Notes (Signed)
 PATIENT CHECK-IN and HEALTH RISK ASSESSMENT QUESTIONNAIRE:  -completed by phone/video for upcoming Medicare Preventive Visit  Pre-Visit Check-in: 1)Vitals (height, wt, BP, etc) - record in vitals section for visit on day of visit Request home vitals (wt, BP, etc.) and enter into vitals, THEN update Vital Signs SmartPhrase below at the top of the HPI. See below.  2)Review and Update Medications, Allergies PMH, Surgeries, Social history in Epic 3)Hospitalizations in the last year with date/reason? n  4)Review and Update Care Team (patient's specialists) in Epic 5) Complete PHQ9 in Epic  6) Complete Fall Screening in Epic 7)Review all Health Maintenance Due and order under PCP if not done.  Medicare Wellness Patient Questionnaire:  Answer theses question about your habits: How often do you have a drink containing alcohol?yes - 1 day per week How many drinks containing alcohol do you have on a typical day when you are drinking? 1 never more than 2 How often do you have six or more drinks on one occasion? never Have you ever smoked? Yes, quit 20 years ago Do you use an illicit drugs?n On average, how many days per week do you engage in moderate to strenuous exercise (like a brisk walk)? Every other day, does 2 miles on bike and machines - 35-40 minutes, at the fitness center, also has silver sneakers, has fitness and balance class at facility Typical breakfast: cereal or protein bar Typical lunch: eats at food court Typical dinner: soup or sandwich Typical snacks:some  Beverages: coffee, water  Answer theses question about your everyday activities: Can you perform most household chores?y Are you deaf or have significant trouble hearing?n Do you feel that you have a problem with memory?n Do you feel safe at home?y Last dentist visit? Goes on regular basis 8. Do you have any difficulty performing your everyday activities?n Are you having any difficulty walking, taking medications on your  own, and or difficulty managing daily home needs?n Do you have difficulty walking or climbing stairs?n Do you have difficulty dressing or bathing?n Do you have difficulty doing errands alone such as visiting a doctor's office or shopping?n Do you currently have any difficulty preparing food and eating?n Do you currently have any difficulty using the toilet?n Do you have any difficulty managing your finances?n Do you have any difficulties with housekeeping of managing your housekeeping?n   Do you have Advanced Directives in place (Living Will, Healthcare Power or Attorney)? yes   Last eye Exam and location? Sees Dr. Hyacinth Meeker for exams once a year   Do you currently use prescribed or non-prescribed narcotic or opioid pain medications?n  Do you have a history or close family history of breast, ovarian, tubal or peritoneal cancer or a family member with BRCA (breast cancer susceptibility 1 and 2) gene mutations? n    ----------------------------------------------------------------------------------------------------------------------------------------------------------------------------------------------------------------------  Because this visit was a virtual/telehealth visit, some criteria may be missing or patient reported. Any vitals not documented were not able to be obtained and vitals that have been documented are patient reported.    MEDICARE ANNUAL PREVENTIVE CARE VISIT WITH PROVIDER (Welcome to Medicare, initial annual wellness or annual wellness exam)  Virtual Visit via Video Note  I connected with Maejor R Chumney on 07/12/23  by a video enabled telemedicine application and verified that I am speaking with the correct person using two identifiers.  Location patient: home Location provider:work or home office Persons participating in the virtual visit: patient, provider  Concerns and/or follow up today: none   has a past  medical history of BPH (benign prostatic  hyperplasia), Coronary artery disease, Diabetes mellitus without complication (HCC), Diverticulosis of colon, GERD (gastroesophageal reflux disease), History of bacterial meningitis (1987), colonic polyps, Hypercholesteremia, Hypertension, Increased prostate specific antigen (PSA) velocity, Meniscus tear, Obesity, Peroneal muscular atrophy, and Sleep apnea.   See HM section in Epic for other details of completed HM.    ROS: negative for report of fevers, unintentional weight loss (no, has lost some intentionally with ozempic), vision changes, vision loss, hearing loss or change, chest pain, sob, hemoptysis, melena, hematochezia, hematuria, falls, bleeding or bruising, thoughts of suicide or self harm, memory loss  Patient-completed extensive health risk assessment - reviewed and discussed with the patient: See Health Risk Assessment completed with patient prior to the visit either above or in recent phone note. This was reviewed in detailed with the patient today and appropriate recommendations, orders and referrals were placed as needed per Summary below and patient instructions.   Review of Medical History: -PMH, PSH, Family History and current specialty and care providers reviewed and updated and listed below   Patient Care Team: Philip Aspen, [name], MD as PCP - General (Internal Medicine)   Past Medical History:  Diagnosis Date   BPH (benign prostatic hyperplasia)    Coronary artery disease    Diabetes mellitus without complication (HCC)    Diverticulosis of colon    GERD (gastroesophageal reflux disease)    History of bacterial meningitis 1987   Hx of colonic polyps    Hypercholesteremia    no per pt   Hypertension    Increased prostate specific antigen (PSA) velocity    Meniscus tear    left knee   Obesity    Peroneal muscular atrophy    Sleep apnea    wears cpap    Past Surgical History:  Procedure Laterality Date   CHOLECYSTECTOMY     COLONOSCOPY  02/2004    Dr.Streck   KNEE ARTHROSCOPY Left 04/17/2014   Procedure: LEFT KNEE ARTHROSCOPY WITH MEDIAL MENISCAL DEBRIDEMENT  ;  Surgeon: Loanne Drilling, MD;  Location: [facility];  Service: Orthopedics;  Laterality: Left;   POLYPECTOMY     TRIGGER FINGER RELEASE  2009   right hand--Dr Mina Marble   UPPER GASTROINTESTINAL ENDOSCOPY      Social History   Socioeconomic History   Marital status: Married    Spouse name: martha   Number of children: Not on file   Years of education: Not on file   Highest education level: Associate degree: academic program  Occupational History   Not on file  Tobacco Use   Smoking status: Former    Current packs/day: 0.00    Average packs/day: 1 pack/day for 30.0 years (30.0 ttl pk-yrs)    Types: Cigarettes    Start date: 04/05/1969    Quit date: 04/06/1999    Years since quitting: 24.2    Passive exposure: Past   Smokeless tobacco: Never  Vaping Use   Vaping status: Never Used  Substance and Sexual Activity   Alcohol use: Yes    Comment: social   Drug use: No   Sexual activity: Not on file  Other Topics Concern   Not on file  Social History Narrative   Not on file   Social Drivers of Health   Financial Resource Strain: Low Risk  (11/20/2021)   Overall Financial Resource Strain (CARDIA)    Difficulty of Paying Living Expenses: Not hard at all  Food Insecurity: No Food Insecurity (11/20/2021)  Hunger Vital Sign    Worried About Running Out of Food in the Last Year: Never true    Ran Out of Food in the Last Year: Never true  Transportation Needs: No Transportation Needs (11/20/2021)   PRAPARE - Administrator, Civil Service (Medical): No    Lack of Transportation (Non-Medical): No  Physical Activity: Insufficiently Active (11/20/2021)   Exercise Vital Sign    Days of Exercise per Week: 3 days    Minutes of Exercise per Session: 40 min  Stress: No Stress Concern Present (11/20/2021)   [employer] of Occupational Health - Occupational Stress  Questionnaire    Feeling of Stress : Only a little  Social Connections: Socially Integrated (11/20/2021)   Social Connection and Isolation Panel [NHANES]    Frequency of Communication with Friends and Family: Three times a week    Frequency of Social Gatherings with Friends and Family: Twice a week    Attends Religious Services: More than 4 times per year    Active Member of [employer] or Organizations: Yes    Attends Engineer, structural: More than 4 times per year    Marital Status: Married  Catering manager Violence: Not on file    Family History  Problem Relation Age of Onset   COPD Mother    Charcot-Marie-Tooth disease Mother    Heart disease Father    Hypertension Father    Prostate cancer Father        dx in his 55's   Cancer - Other Brother    Lung cancer Brother    Charcot-Marie-Tooth disease Maternal Grandmother    Colon cancer Neg Hx    Esophageal cancer Neg Hx    Stomach cancer Neg Hx    Rectal cancer Neg Hx    Liver cancer Neg Hx    Pancreatic cancer Neg Hx    Colon polyps Neg Hx     Current Outpatient Medications on File Prior to Visit  Medication Sig Dispense Refill   amLODipine (NORVASC) 5 MG tablet TAKE 1 TABLET BY MOUTH DAILY 90 tablet 1   aspirin 81 MG tablet Take 81 mg by mouth daily.      atorvastatin (LIPITOR) 40 MG tablet TAKE 1 TABLET BY MOUTH DAILY 90 tablet 0   Semaglutide, 1 MG/DOSE, (OZEMPIC, 1 MG/DOSE,) 4 MG/3ML SOPN DIAL AND INJECT UNDER THE SKIN 1 MG WEEKLY 6 mL 0   tadalafil (CIALIS) 10 MG tablet Take 1 tablet (10 mg total) by mouth daily. Take 10 mg daily, with an additional 10 mg boost dose 30 to 45 minutes prior to sexual activity.  This medication can be taken with or without food 30 tablet 11   tamsulosin (FLOMAX) 0.4 MG CAPS capsule Take 1 capsule (0.4 mg total) by mouth daily. 30 capsule 11   valsartan-hydrochlorothiazide (DIOVAN-HCT) 160-25 MG tablet TAKE 1 TABLET BY MOUTH DAILY 90 tablet 0   No current facility-administered  medications on file prior to visit.    Allergies  Allergen Reactions   Penicillins Swelling    REACTION: rash as a child       Physical Exam Vitals requested from patient and listed below if patient had equipment and was able to obtain at home for this virtual visit: There were no vitals filed for this visit. Estimated body mass index is 31.56 kg/m as calculated from the following:   Height as of 04/05/23: 6\' 1"  (1.854 m).   Weight as of 07/04/23: 239 lb 3.2 oz (108.5  kg).  EKG (optional): deferred due to virtual visit  GENERAL: alert, oriented, no acute distress detected; full vision exam deferred due to pandemic and/or virtual encounter   HEENT: atraumatic, conjunttiva clear, no obvious abnormalities on inspection of external nose and ears  NECK: normal movements of the head and neck  LUNGS: on inspection no signs of respiratory distress, breathing rate appears normal, no obvious gross SOB, gasping or wheezing  CV: no obvious cyanosis  MS: moves all visible extremities without noticeable abnormality  PSYCH/NEURO: pleasant and cooperative, no obvious depression or anxiety, speech and thought processing grossly intact, Cognitive function grossly intact  Flowsheet Row Office Visit from [facility]/03/2023 in [facility]  PHQ-9 Total Score 1           07/12/2023   10:39 AM 07/04/2023    7:57 AM 01/03/2023    8:09 AM 03/23/2022    9:30 AM 06/17/2021   11:02 AM  Depression screen PHQ 2/9  Decreased Interest 0 0 0 1 0  Down, Depressed, Hopeless 0 0 0 1 0  PHQ - 2 Score 0 0 0 2 0  Altered sleeping   0 2 0  Tired, decreased energy   0 0 1  Change in appetite   1 0 0  Feeling bad or failure about yourself    0 1 0  Trouble concentrating   0 0 0  Moving slowly or fidgety/restless   0 0 0  Suicidal thoughts   0 0 0  PHQ-9 Score   1 5 1   Difficult doing work/chores   Not difficult at all         06/22/2022    9:53 AM 01/03/2023    8:00 AM  05/19/2023    8:03 AM 07/04/2023    7:57 AM 07/12/2023   10:35 AM  Fall Risk  Falls in the past year? 0 0 1 1 1   Was there an injury with Fall? 0 0 0 0 0  Fall Risk Category Calculator 0 0 1  1 1   Fall risk Follow up Falls evaluation completed Falls evaluation completed  Falls evaluation completed Falls evaluation completed;Education provided     Patient-reported     SUMMARY AND PLAN:  Medicare annual wellness visit, subsequent  Discussed applicable health maintenance/preventive health measures and advised and referred or ordered per patient preferences: -discussed GI recs for a repeat colonoscopy last year, he agrees to call GI, number provided -immunizations due discussed, advised can get at the pharmacy Health Maintenance  Topic Date Due   Zoster Vaccines- Shingrix (1 of 2) 02/06/1964   COVID-19 Vaccine (8 - 2024-25 season) 12/05/2022   Colonoscopy  01/03/2024 (Originally 12/26/2021)   INFLUENZA VACCINE  11/04/2023   HEMOGLOBIN A1C  01/03/2024   FOOT EXAM  02/28/2024   OPHTHALMOLOGY EXAM  03/23/2024   Diabetic kidney evaluation - eGFR measurement  07/03/2024   Diabetic kidney evaluation - Urine ACR  07/03/2024   Medicare Annual Wellness (AWV)  07/11/2024   DTaP/Tdap/Td (2 - Td or Tdap) 01/05/2025   Pneumonia Vaccine 63+ Years old  Completed   Hepatitis C Screening  Completed   HPV VACCINES  Aged [employer] and counseling on the following was provided based on the above review of health and a plan/checklist for the patient, along with additional information discussed, was provided for the patient in the patient instructions :  -Provided counseling and plan for increased risk of falling if applicable per above  screening. Reviewed and demonstrated safe balance exercises that can be done at home to improve balance and discussed exercise guidelines for adults with include balance exercises at least 3 days per week.  -Advised and counseled on a healthy lifestyle - including  the importance of a healthy diet, regular physical activity, social connections and stress management. -Reviewed patient's current diet. Advised and counseled on a whole foods based healthy diet. A summary of a healthy diet was provided in the Patient Instructions.  -reviewed patient's current physical activity level and discussed exercise guidelines for adults. Discussed community resources and ideas for safe exercise at home to assist in meeting exercise guideline recommendations in a safe and healthy way.  -Advise yearly dental visits at minimum and regular eye exams   Follow up: see patient instructions   Patient Instructions  I really enjoyed getting to talk with you today! I am available on Tuesdays and Thursdays for virtual visits if you have any questions or concerns, or if I can be of any further assistance.   CHECKLIST FROM ANNUAL WELLNESS VISIT:  -Follow up (please call to schedule if not scheduled after visit):   -yearly for annual wellness visit with primary care office  Here is a list of your preventive care/health maintenance measures and the plan for each if any are due:  PLAN For any measures below that may be due:   Please call your gastroenterologist about the colonoscopy: [phone]  If you want to do the vaccines please do so at the pharmacy, then let us know so that we can update your records.   Health Maintenance  Topic Date Due   Zoster Vaccines- Shingrix (1 of 2) 02/06/1964   COVID-19 Vaccine (8 - 2024-25 season) 12/05/2022   Colonoscopy  01/03/2024    INFLUENZA VACCINE  11/04/2023   HEMOGLOBIN A1C  01/03/2024   FOOT EXAM  02/28/2024   OPHTHALMOLOGY EXAM  03/23/2024   Diabetic kidney evaluation - eGFR measurement  07/03/2024   Diabetic kidney evaluation - Urine ACR  07/03/2024   Medicare Annual Wellness (AWV)  07/11/2024   DTaP/Tdap/Td (2 - Td or Tdap) 01/05/2025   Pneumonia Vaccine 81+ Years old  Completed   Hepatitis C Screening  Completed   HPV  VACCINES  Aged Out    -See a dentist at least yearly  -Get your eyes checked and then per your eye specialist's recommendations  -Other issues addressed today:   -I have included below further information regarding a healthy whole foods based diet, physical activity guidelines for adults, stress management and opportunities for social connections. I hope you find this information useful.   -----------------------------------------------------------------------------------------------------------------------------------------------------------------------------------------------------------------------------------------------------------    NUTRITION: -eat real food: lots of colorful vegetables (half the plate) and fruits -5-7 servings of vegetables and fruits per day (fresh or steamed is best), exp. 2 servings of vegetables with lunch and dinner and 2 servings of fruit per day. Berries and greens such as kale and collards are great choices.  -consume on a regular basis:  fresh fruits, fresh veggies, fish, nuts, seeds, healthy oils (such as olive oil, avocado oil), whole grains (make sure for bread/pasta/crackers/etc., that the first ingredient on label contains the word "whole"), legumes. -can eat small amounts of dairy and lean meat (no larger than the palm of your hand), but avoid processed meats such as ham, bacon, lunch meat, etc. -drink water -try to avoid fast food and pre-packaged foods, processed meat, ultra processed foods/beverages (donuts, candy, etc.) -most experts advise limiting sodium to <  2300mg  per day, should limit further is any chronic conditions such as high blood pressure, heart disease, diabetes, etc. The American Heart Association advised that < 1500mg  is is ideal -try to avoid foods/beverages that contain any ingredients with names you do not recognize  -try to avoid foods/beverages  with added sugar or sweeteners/sweets  -try to avoid sweet drinks (including diet  drinks): soda, juice, Gatorade, sweet tea, power drinks, diet drinks -try to avoid white rice, white bread, pasta (unless whole grain)  EXERCISE GUIDELINES FOR ADULTS: -if you wish to increase your physical activity, do so gradually and with the approval of your doctor -STOP and seek medical care immediately if you have any chest pain, chest discomfort or trouble breathing when starting or increasing exercise  -move and stretch your body, legs, feet and arms when sitting for long periods -Physical activity guidelines for optimal health in adults: -get at least 150 minutes per week of moderate exercise (can talk, but not sing); this is about 20-30 minutes of sustained activity 5-7 days per week or two 10-15 minute episodes of sustained activity 5-7 days per week -do some muscle building/resistance training/strength training at least 2 days per week  -balance exercises 3+ days per week:   Stand somewhere where you have something sturdy to hold onto if you lose balance    1) lift up on toes, then back down, start with 5x per day and work up to 20x   2) stand and lift one leg straight out to the side so that foot is a few inches of the floor, start with 5x each side and work up to 20x each side   3) stand on one foot, start with 5 seconds each side and work up to 20 seconds on each side  If you need ideas or help with getting more active:  -Silver sneakers https://tools.silversneakers.com  -Walk with a Doc: http://www.duncan-williams.com/  -try to include resistance (weight lifting/strength building) and balance exercises twice per week: or the following link for ideas: http://castillo-powell.com/  BuyDucts.dk  STRESS MANAGEMENT: -can try meditating, or just sitting quietly with deep breathing while intentionally relaxing all parts of your body for 5 minutes daily -if you need further help with stress,  anxiety or depression please follow up with your primary doctor or contact the wonderful folks at [facility]: [phone]  SOCIAL CONNECTIONS: -options in [place] if you wish to engage in more social and exercise related activities:  -Silver sneakers https://tools.silversneakers.com  -Walk with a Doc: http://www.duncan-williams.com/  -Check out the [facility] Active Adults 50+ section on the [place] [employer] (hiking clubs, book clubs, cards and games, chess, exercise classes, aquatic classes and much more) - see the website for details: https://www.[place].gov/departments/parks-recreation/active-adults50  -YouTube has lots of exercise videos for different ages and abilities as well  -[facility] (a variety of indoor and outdoor inperson activities for adults). [phone]. [address].  -Virtual Online Classes (a variety of topics): see seniorplanet.org or call 587-214-3890  -consider volunteering at a school, hospice center, church, senior center or elsewhere            Terressa Koyanagi, DO

## 2023-07-12 NOTE — Telephone Encounter (Signed)
 Spoke with pt and he is aware of Dr. Lauro Franklin comments and recommendations. Pt states he wants to think about this and he will let us know. States his wife passed away and he doe not really have anyone to bring him. He will let us know what he decides. Dr. Rhea Belton aware.

## 2023-08-09 ENCOUNTER — Other Ambulatory Visit: Payer: Self-pay | Admitting: Internal Medicine

## 2023-08-09 DIAGNOSIS — E1169 Type 2 diabetes mellitus with other specified complication: Secondary | ICD-10-CM

## 2023-08-18 ENCOUNTER — Other Ambulatory Visit: Payer: Self-pay | Admitting: Internal Medicine

## 2023-08-18 DIAGNOSIS — I10 Essential (primary) hypertension: Secondary | ICD-10-CM

## 2023-09-06 ENCOUNTER — Other Ambulatory Visit: Payer: Self-pay | Admitting: Internal Medicine

## 2023-09-06 DIAGNOSIS — E782 Mixed hyperlipidemia: Secondary | ICD-10-CM

## 2023-09-12 ENCOUNTER — Ambulatory Visit (INDEPENDENT_AMBULATORY_CARE_PROVIDER_SITE_OTHER): Admitting: Podiatry

## 2023-09-12 DIAGNOSIS — L84 Corns and callosities: Secondary | ICD-10-CM | POA: Diagnosis not present

## 2023-09-12 DIAGNOSIS — E1142 Type 2 diabetes mellitus with diabetic polyneuropathy: Secondary | ICD-10-CM | POA: Diagnosis not present

## 2023-09-12 DIAGNOSIS — M79675 Pain in left toe(s): Secondary | ICD-10-CM

## 2023-09-12 DIAGNOSIS — B351 Tinea unguium: Secondary | ICD-10-CM | POA: Diagnosis not present

## 2023-09-12 DIAGNOSIS — M79674 Pain in right toe(s): Secondary | ICD-10-CM | POA: Diagnosis not present

## 2023-09-18 ENCOUNTER — Encounter: Payer: Self-pay | Admitting: Podiatry

## 2023-09-18 NOTE — Progress Notes (Signed)
 Subjective:  Patient ID: Ralph Garrett, male    DOB: 01-23-1946,  MRN: [id number]  Ralph Garrett presents to clinic today for at risk foot care with history of diabetic neuropathy and callus(es) of both feet and painful mycotic toenails that are difficult to trim. Painful toenails interfere with ambulation. Aggravating factors include wearing enclosed shoe gear. Pain is relieved with periodic professional debridement. Painful calluses are aggravated when weightbearing with and without shoegear. Pain is relieved with periodic professional debridement. Patient has h/o Charcot Marie Tooth Disease. Chief Complaint  Patient presents with   Nail Problem    Thick painful toenails, 3 month follow up   New problem(s): None.   PCP is Zilphia Hilt, Charyl Coppersmith, MD.  Allergies  Allergen Reactions   Penicillins Swelling    REACTION: rash as a child    Review of Systems: Negative except as noted in the HPI.  Objective: No changes noted in today's physical examination. There were no vitals filed for this visit. Ralph Garrett is a pleasant 78 y.o. male WD, WN in NAD. AAO x 3.   Vascular Examination: Capillary refill time immediate b/l. Palpable pedal pulses. Pedal hair present b/l. No pain with calf compression b/l. Skin temperature gradient WNL b/l. No cyanosis or clubbing b/l. No ischemia or gangrene noted b/l. No edema noted b/l LE.  Neurological Examination: Pt has subjective symptoms of neuropathy. Sensation grossly intact b/l with 10 gram monofilament. Vibratory sensation intact b/l.   Dermatological Examination: Pedal skin with normal turgor, texture and tone b/l.  No open wounds. No interdigital macerations.   Toenails 1-5 b/l thick, discolored, elongated with subungual debris and pain on dorsal palpation.   Hyperkeratotic lesion(s) submet head 1 b/l and submet head 5 b/l.  No erythema, no edema, no drainage, no fluctuance.  Musculoskeletal Examination: Muscle strength 5/5 to  all lower extremity muscle groups bilaterally. Pes cavus deformity noted b/l lower extremities.  Radiographs: None  Last A1c:      Latest Ref Rng & Units 07/04/2023    8:00 AM 01/03/2023    8:10 AM  Hemoglobin A1C  Hemoglobin-A1c 4.0 - 5.6 % 5.7  5.3     Assessment/Plan: 1. Pain due to onychomycosis of toenails of both feet   2. Callus   3. Diabetic peripheral neuropathy associated with type 2 diabetes mellitus (HCC)   Patient was evaluated and treated. All patient's and/or POA's questions/concerns addressed on today's visit. Mycotic toenails 1-5 debrided in length and girth without incident. Callus(es) submet head 1 b/l and submet head 5 b/l pared with sharp debridement without incident. Continue daily foot inspections and monitor blood glucose per PCP/Endocrinologist's recommendations. Continue soft, supportive shoe gear daily. Report any pedal injuries to medical professional. Call office if there are any questions/concerns. -Offloaded callus(es)/porokeratotic lesion(s) submet head 1 left foot with felt dancers pad applied to insole(s) of shoe. -Patient/POA to call should there be question/concern in the interim.   Return in about 3 months (around 12/13/2023).  Luella Sager, DPM      [place] LOCATION: [address] [place] [address]                   Office [phone]  [place] LOCATION: [address] [place], [place] [address] Office [phone]

## 2023-09-30 ENCOUNTER — Ambulatory Visit: Payer: Medicare Other | Admitting: Student

## 2023-09-30 ENCOUNTER — Ambulatory Visit: Admitting: Student

## 2023-10-03 ENCOUNTER — Other Ambulatory Visit: Payer: Self-pay | Admitting: Internal Medicine

## 2023-10-03 DIAGNOSIS — E1169 Type 2 diabetes mellitus with other specified complication: Secondary | ICD-10-CM

## 2023-10-18 DIAGNOSIS — R0602 Shortness of breath: Secondary | ICD-10-CM | POA: Diagnosis not present

## 2023-10-18 DIAGNOSIS — Z87891 Personal history of nicotine dependence: Secondary | ICD-10-CM | POA: Diagnosis not present

## 2023-10-18 DIAGNOSIS — I459 Conduction disorder, unspecified: Secondary | ICD-10-CM | POA: Diagnosis not present

## 2023-10-18 DIAGNOSIS — R079 Chest pain, unspecified: Secondary | ICD-10-CM | POA: Diagnosis not present

## 2023-10-18 DIAGNOSIS — E119 Type 2 diabetes mellitus without complications: Secondary | ICD-10-CM | POA: Diagnosis not present

## 2023-10-18 DIAGNOSIS — I10 Essential (primary) hypertension: Secondary | ICD-10-CM | POA: Diagnosis not present

## 2023-10-18 DIAGNOSIS — R9431 Abnormal electrocardiogram [ECG] [EKG]: Secondary | ICD-10-CM | POA: Diagnosis not present

## 2023-10-18 DIAGNOSIS — U071 COVID-19: Secondary | ICD-10-CM | POA: Diagnosis not present

## 2023-10-18 DIAGNOSIS — J9601 Acute respiratory failure with hypoxia: Secondary | ICD-10-CM | POA: Diagnosis not present

## 2023-10-18 DIAGNOSIS — Z79899 Other long term (current) drug therapy: Secondary | ICD-10-CM | POA: Diagnosis not present

## 2023-10-18 DIAGNOSIS — R061 Stridor: Secondary | ICD-10-CM | POA: Diagnosis not present

## 2023-10-18 DIAGNOSIS — N400 Enlarged prostate without lower urinary tract symptoms: Secondary | ICD-10-CM | POA: Diagnosis not present

## 2023-10-18 DIAGNOSIS — Z7952 Long term (current) use of systemic steroids: Secondary | ICD-10-CM | POA: Diagnosis not present

## 2023-10-18 DIAGNOSIS — K0889 Other specified disorders of teeth and supporting structures: Secondary | ICD-10-CM | POA: Diagnosis not present

## 2023-10-18 DIAGNOSIS — J069 Acute upper respiratory infection, unspecified: Secondary | ICD-10-CM | POA: Diagnosis not present

## 2023-10-18 DIAGNOSIS — K047 Periapical abscess without sinus: Secondary | ICD-10-CM | POA: Diagnosis not present

## 2023-10-18 DIAGNOSIS — E785 Hyperlipidemia, unspecified: Secondary | ICD-10-CM | POA: Diagnosis not present

## 2023-10-19 DIAGNOSIS — K047 Periapical abscess without sinus: Secondary | ICD-10-CM | POA: Diagnosis not present

## 2023-10-19 DIAGNOSIS — R061 Stridor: Secondary | ICD-10-CM | POA: Diagnosis not present

## 2023-10-19 DIAGNOSIS — E119 Type 2 diabetes mellitus without complications: Secondary | ICD-10-CM | POA: Diagnosis not present

## 2023-10-19 DIAGNOSIS — I10 Essential (primary) hypertension: Secondary | ICD-10-CM | POA: Diagnosis not present

## 2023-11-12 ENCOUNTER — Encounter: Payer: Self-pay | Admitting: Internal Medicine

## 2023-11-14 ENCOUNTER — Encounter: Payer: Self-pay | Admitting: Internal Medicine

## 2023-11-17 ENCOUNTER — Encounter: Payer: Self-pay | Admitting: Urology

## 2023-11-25 ENCOUNTER — Other Ambulatory Visit: Payer: Self-pay | Admitting: Internal Medicine

## 2023-11-25 DIAGNOSIS — E1169 Type 2 diabetes mellitus with other specified complication: Secondary | ICD-10-CM

## 2023-12-19 ENCOUNTER — Ambulatory Visit: Admitting: Podiatry

## 2023-12-19 ENCOUNTER — Encounter: Payer: Self-pay | Admitting: Podiatry

## 2023-12-19 DIAGNOSIS — M79675 Pain in left toe(s): Secondary | ICD-10-CM | POA: Diagnosis not present

## 2023-12-19 DIAGNOSIS — L84 Corns and callosities: Secondary | ICD-10-CM | POA: Diagnosis not present

## 2023-12-19 DIAGNOSIS — E1142 Type 2 diabetes mellitus with diabetic polyneuropathy: Secondary | ICD-10-CM

## 2023-12-19 DIAGNOSIS — B351 Tinea unguium: Secondary | ICD-10-CM

## 2023-12-19 DIAGNOSIS — M79674 Pain in right toe(s): Secondary | ICD-10-CM

## 2023-12-19 NOTE — Progress Notes (Signed)
  Subjective:  Patient ID: Ralph Garrett, male    DOB: 12-19-1945,  MRN: [id number]  Ralph Garrett presents to clinic today for at risk foot care with history of diabetic neuropathy and callus(es) b/l lower extremities and painful mycotic toenails that are difficult to trim. Painful toenails interfere with ambulation. Aggravating factors include wearing enclosed shoe gear. Pain is relieved with periodic professional debridement. Painful calluses are aggravated when weightbearing with and without shoegear. Pain is relieved with periodic professional debridement.   New problem(s): None.   PCP is Ralph Garrett, Ralph GRADE, MD.  Allergies  Allergen Reactions   Penicillins Swelling    REACTION: rash as a child   Review of Systems: Negative except as noted in the HPI.  Objective: No changes noted in today's physical examination. There were no vitals filed for this visit. Ralph Garrett is a pleasant 78 y.o. male in NAD. AAO x 3.  Vascular Examination: Capillary refill time immediate b/l. Palpable pedal pulses. Pedal hair present b/l. No pain with calf compression b/l. Skin temperature gradient WNL b/l. No cyanosis or clubbing b/l. No ischemia or gangrene noted b/l. No edema noted b/l LE.  Neurological Examination: Pt has subjective symptoms of neuropathy. Sensation grossly intact b/l with 10 gram monofilament. Vibratory sensation intact b/l.   Dermatological Examination: Pedal skin with normal turgor, texture and tone b/l.  No open wounds. No interdigital macerations.   Toenails 1-5 b/l thick, discolored, elongated with subungual debris and pain on dorsal palpation.   Hyperkeratotic lesion(s) submet head 1 b/l and submet head 5 b/l, but are to a lesser degree than previous visits.  No erythema, no edema, no drainage, no fluctuance.  Musculoskeletal Examination: Muscle strength 5/5 to all lower extremity muscle groups bilaterally. Pes cavus deformity noted b/l lower  extremities.  Radiographs: None  Assessment/Plan: 1. Pain due to onychomycosis of toenails of both feet   2. Callus   3. Diabetic peripheral neuropathy associated with type 2 diabetes mellitus [facility])   Consent given for treatment. Patient examined.All patient's and/or POA's questions/concerns addressed on today's visit. Toenails 1-5 debrided in length and girth without incident. Callus(es) submet head 1 b/l and submet head 5 b/l pared with sharp debridement without incident. Continue foot and shoe inspections daily. Monitor blood glucose per PCP/Endocrinologist's recommendations.Continue soft, supportive shoe gear daily. Report any pedal injuries to medical professional. Call office if there are any questions/concerns.  Return in about 3 months (around 03/19/2024).  Delon LITTIE Merlin, DPM      [place] LOCATION: [address] [place] [address]                   Office [phone]   [facility] LOCATION: [address] [place], [place] [address] Office [phone]

## 2024-01-03 ENCOUNTER — Encounter: Payer: Self-pay | Admitting: Internal Medicine

## 2024-01-03 ENCOUNTER — Ambulatory Visit (INDEPENDENT_AMBULATORY_CARE_PROVIDER_SITE_OTHER): Admitting: Internal Medicine

## 2024-01-03 VITALS — BP 120/68 | HR 80 | Temp 97.8°F | Wt 245.5 lb

## 2024-01-03 DIAGNOSIS — Z7985 Long-term (current) use of injectable non-insulin antidiabetic drugs: Secondary | ICD-10-CM

## 2024-01-03 DIAGNOSIS — N138 Other obstructive and reflux uropathy: Secondary | ICD-10-CM

## 2024-01-03 DIAGNOSIS — E782 Mixed hyperlipidemia: Secondary | ICD-10-CM | POA: Diagnosis not present

## 2024-01-03 DIAGNOSIS — E1169 Type 2 diabetes mellitus with other specified complication: Secondary | ICD-10-CM

## 2024-01-03 DIAGNOSIS — N529 Male erectile dysfunction, unspecified: Secondary | ICD-10-CM | POA: Diagnosis not present

## 2024-01-03 DIAGNOSIS — N401 Enlarged prostate with lower urinary tract symptoms: Secondary | ICD-10-CM | POA: Diagnosis not present

## 2024-01-03 DIAGNOSIS — I10 Essential (primary) hypertension: Secondary | ICD-10-CM | POA: Diagnosis not present

## 2024-01-03 LAB — POCT GLYCOSYLATED HEMOGLOBIN (HGB A1C): Hemoglobin A1C: 5.6 % (ref 4.0–5.6)

## 2024-01-03 NOTE — Progress Notes (Signed)
 Established Patient Office Visit     CC/Reason for Visit: Follow-up chronic conditions  HPI: Ralph Garrett is a 78 y.o. male who is coming in today for the above mentioned reasons. Past Medical History is significant for: Hypertension, hyperlipidemia, type 2 diabetes, BPH, erectile dysfunction, obesity, GERD, OSA.  Feeling well.  In July he was hospitalized for COVID infection that caused stridor.  He is improved.   Past Medical/Surgical History: Past Medical History:  Diagnosis Date   BPH (benign prostatic hyperplasia)    Coronary artery disease    Diabetes mellitus without complication (HCC)    Diverticulosis of colon    GERD (gastroesophageal reflux disease)    History of bacterial meningitis 1987   Hx of colonic polyps    Hypercholesteremia    no per pt   Hypertension    Increased prostate specific antigen (PSA) velocity    Meniscus tear    left knee   Obesity    Peroneal muscular atrophy    Sleep apnea    wears cpap    Past Surgical History:  Procedure Laterality Date   CHOLECYSTECTOMY     COLONOSCOPY  02/2004   Dr.Streck   KNEE ARTHROSCOPY Left 04/17/2014   Procedure: LEFT KNEE ARTHROSCOPY WITH MEDIAL MENISCAL DEBRIDEMENT  ;  Surgeon: Dempsey Melodi GAILS, MD;  Location: [facility];  Service: Orthopedics;  Laterality: Left;   POLYPECTOMY     TRIGGER FINGER RELEASE  2009   right hand--Dr Sissy   UPPER GASTROINTESTINAL ENDOSCOPY      Social History:  reports that he quit smoking about 24 years ago. His smoking use included cigarettes. He started smoking about 54 years ago. He has a 30 pack-year smoking history. He has been exposed to tobacco smoke. He has never used smokeless tobacco. He reports current alcohol use. He reports that he does not use drugs.  Allergies: Allergies  Allergen Reactions   Penicillins Swelling    REACTION: rash as a child    Family History:  Family History  Problem Relation Age of Onset   COPD Mother    Charcot-Marie-Tooth  disease Mother    Heart disease Father    Hypertension Father    Prostate cancer Father        dx in his 56's   Cancer - Other Brother    Lung cancer Brother    Charcot-Marie-Tooth disease Maternal Grandmother    Colon cancer Neg Hx    Esophageal cancer Neg Hx    Stomach cancer Neg Hx    Rectal cancer Neg Hx    Liver cancer Neg Hx    Pancreatic cancer Neg Hx    Colon polyps Neg Hx      Current Outpatient Medications:    amLODipine  (NORVASC ) 5 MG tablet, TAKE 1 TABLET BY MOUTH DAILY, Disp: 90 tablet, Rfl: 1   aspirin 81 MG tablet, Take 81 mg by mouth daily. , Disp: , Rfl:    atorvastatin  (LIPITOR) 40 MG tablet, TAKE 1 TABLET BY MOUTH DAILY, Disp: 90 tablet, Rfl: 1   OZEMPIC , 1 MG/DOSE, 4 MG/3ML SOPN, DIAL AND INJECT UNDER THE SKIN 1 MG WEEKLY, Disp: 6 mL, Rfl: 0   tadalafil  (CIALIS ) 10 MG tablet, Take 1 tablet (10 mg total) by mouth daily. Take 10 mg daily, with an additional 10 mg boost dose 30 to 45 minutes prior to sexual activity.  This medication can be taken with or without food, Disp: 30 tablet, Rfl: 11   tamsulosin  (FLOMAX )  0.4 MG CAPS capsule, Take 1 capsule (0.4 mg total) by mouth daily., Disp: 30 capsule, Rfl: 11   valsartan -hydrochlorothiazide  (DIOVAN -HCT) 160-25 MG tablet, TAKE 1 TABLET BY MOUTH DAILY, Disp: 90 tablet, Rfl: 1  Review of Systems:  Negative unless indicated in HPI.   Physical Exam: Vitals:   01/03/24 0918  BP: 120/68  Pulse: 80  Temp: 97.8 F (36.6 C)  TempSrc: Oral  SpO2: 98%  Weight: 245 lb 8 oz (111.4 kg)    Body mass index is 32.39 kg/m.   Physical Exam Vitals reviewed.  Constitutional:      Appearance: Normal appearance. He is obese.  HENT:     Head: Normocephalic and atraumatic.  Eyes:     Conjunctiva/sclera: Conjunctivae normal.  Cardiovascular:     Rate and Rhythm: Normal rate and regular rhythm.  Pulmonary:     Effort: Pulmonary effort is normal.     Breath sounds: Normal breath sounds.  Skin:    General: Skin is warm  and dry.  Neurological:     General: No focal deficit present.     Mental Status: He is alert and oriented to person, place, and time.  Psychiatric:        Mood and Affect: Mood normal.        Behavior: Behavior normal.        Thought Content: Thought content normal.        Judgment: Judgment normal.      Impression and Plan:  Type 2 diabetes mellitus with other specified complication, without long-term current use of insulin  (HCC) Assessment & Plan: Well-controlled with an A1c of 5.6.  Orders: -     POCT glycosylated hemoglobin (Hb A1C)  Essential hypertension Assessment & Plan: Well-controlled on current.   Mixed hyperlipidemia Assessment & Plan: On atorvastatin , last LDL was at goal at 32.   Benign prostatic hyperplasia with urinary obstruction Assessment & Plan: On Flomax .   Erectile dysfunction, unspecified erectile dysfunction type Assessment & Plan: On as needed Cialis .  Follows with urology.      Time spent:31 minutes reviewing chart, interviewing and examining patient and formulating plan of care.     Tully Theophilus Andrews, MD [facility]

## 2024-01-03 NOTE — Assessment & Plan Note (Signed)
 Well-controlled with an A1c of 5.6.

## 2024-01-03 NOTE — Assessment & Plan Note (Signed)
 Well-controlled on current.

## 2024-01-03 NOTE — Assessment & Plan Note (Signed)
 On as needed Cialis .  Follows with urology.

## 2024-01-03 NOTE — Assessment & Plan Note (Signed)
 On Flomax 

## 2024-01-03 NOTE — Assessment & Plan Note (Signed)
 On atorvastatin , last LDL was at goal at 32.

## 2024-01-17 ENCOUNTER — Other Ambulatory Visit: Payer: Self-pay | Admitting: Internal Medicine

## 2024-01-17 DIAGNOSIS — E1169 Type 2 diabetes mellitus with other specified complication: Secondary | ICD-10-CM

## 2024-02-15 ENCOUNTER — Other Ambulatory Visit: Payer: Self-pay | Admitting: Internal Medicine

## 2024-02-15 DIAGNOSIS — I10 Essential (primary) hypertension: Secondary | ICD-10-CM

## 2024-03-01 ENCOUNTER — Other Ambulatory Visit: Payer: Self-pay | Admitting: Internal Medicine

## 2024-03-01 DIAGNOSIS — E782 Mixed hyperlipidemia: Secondary | ICD-10-CM

## 2024-03-10 ENCOUNTER — Other Ambulatory Visit: Payer: Self-pay | Admitting: Internal Medicine

## 2024-03-10 DIAGNOSIS — E1169 Type 2 diabetes mellitus with other specified complication: Secondary | ICD-10-CM

## 2024-03-19 ENCOUNTER — Encounter: Payer: Self-pay | Admitting: Podiatry

## 2024-03-19 ENCOUNTER — Ambulatory Visit: Admitting: Podiatry

## 2024-03-19 DIAGNOSIS — M79675 Pain in left toe(s): Secondary | ICD-10-CM

## 2024-03-19 DIAGNOSIS — Q667 Congenital pes cavus, unspecified foot: Secondary | ICD-10-CM | POA: Diagnosis not present

## 2024-03-19 DIAGNOSIS — G600 Hereditary motor and sensory neuropathy: Secondary | ICD-10-CM | POA: Diagnosis not present

## 2024-03-19 DIAGNOSIS — M79674 Pain in right toe(s): Secondary | ICD-10-CM

## 2024-03-19 DIAGNOSIS — B351 Tinea unguium: Secondary | ICD-10-CM | POA: Diagnosis not present

## 2024-03-19 DIAGNOSIS — E1142 Type 2 diabetes mellitus with diabetic polyneuropathy: Secondary | ICD-10-CM

## 2024-03-19 DIAGNOSIS — E119 Type 2 diabetes mellitus without complications: Secondary | ICD-10-CM

## 2024-03-19 DIAGNOSIS — Z0189 Encounter for other specified special examinations: Secondary | ICD-10-CM

## 2024-03-19 DIAGNOSIS — L84 Corns and callosities: Secondary | ICD-10-CM | POA: Diagnosis not present

## 2024-03-20 ENCOUNTER — Ambulatory Visit: Payer: Medicare Other | Admitting: Urology

## 2024-03-21 ENCOUNTER — Encounter: Payer: Self-pay | Admitting: Urology

## 2024-03-21 ENCOUNTER — Ambulatory Visit: Admitting: Urology

## 2024-03-21 VITALS — BP 115/71 | HR 89 | Ht 73.0 in | Wt 248.5 lb

## 2024-03-21 DIAGNOSIS — N1330 Unspecified hydronephrosis: Secondary | ICD-10-CM | POA: Diagnosis not present

## 2024-03-21 DIAGNOSIS — N320 Bladder-neck obstruction: Secondary | ICD-10-CM | POA: Diagnosis not present

## 2024-03-21 DIAGNOSIS — N138 Other obstructive and reflux uropathy: Secondary | ICD-10-CM

## 2024-03-21 DIAGNOSIS — N529 Male erectile dysfunction, unspecified: Secondary | ICD-10-CM

## 2024-03-21 DIAGNOSIS — R339 Retention of urine, unspecified: Secondary | ICD-10-CM | POA: Diagnosis not present

## 2024-03-21 DIAGNOSIS — N401 Enlarged prostate with lower urinary tract symptoms: Secondary | ICD-10-CM | POA: Diagnosis not present

## 2024-03-21 LAB — BLADDER SCAN AMB NON-IMAGING

## 2024-03-21 MED ORDER — TADALAFIL 10 MG PO TABS: 10.0000 mg | ORAL_TABLET | Freq: Every day | ORAL | 11 refills | Status: AC

## 2024-03-21 MED ORDER — TAMSULOSIN HCL 0.4 MG PO CAPS: 0.4000 mg | ORAL_CAPSULE | Freq: Every day | ORAL | 11 refills | Status: AC

## 2024-03-21 NOTE — Patient Instructions (Signed)
 Understanding Erectile Dysfunction (ED)  What is ED? Erectile Dysfunction, or ED, is when a man has trouble getting or keeping an erection enough for sex. It is common and can happen at any age but is more common as men get older.  What Causes ED? ED can happen for many reasons, including:    Stress or feeling anxious Health problems like diabetes, heart disease, or high blood pressure Lifestyle habits like smoking, drinking too much alcohol, drug use, or not enough exercise Certain medicines(some blood pressure medications, antidepressants, sedatives)  Symptoms of ED:   Difficulty getting an erection Trouble keeping an erection during sex Reduced interest in sex  Treatment Options There are different ways to treat ED. Talk to your doctor to find what's best for you.  Lifestyle Changes   Exercise regularly Eat healthy foods Quit smoking and limit alcohol Weight loss Reduce stress  Medications   Oral medicines like Viagra(sildenafil) or Cialis (tadalafil ).  These are generic and affordable, the lowest price is at [email or web address]com.  These must be taken about an hour before sexual activity, and still require sexual stimulation to get an erection Side effects can include stuffy nose, headache, muscle pain, or changes in vision There is no risk of heart attack or stroke when medications are taken as directed These medications cannot be taken with nitrates for chest pain  Other Treatments    Penile injections-a medicine is injected directly into the penis to give you an immediate erection Devices like pump vacuum systems that help create an erection Surgery: Penile prosthesis for patients with no improvement with medicines or injections who are still interested in sexual activity.  Visit [email or web address] for more details.  Dr. Lovie is a urologist in [facility] with [facility] who performs penile prosthesis surgeries Counseling or Therapy    If ED is caused by  stress, anxiety, or relationship issues, talking to a counselor or therapist can help.

## 2024-03-21 NOTE — Progress Notes (Signed)
° °  03/21/2024 8:55 AM   Ralph Garrett 01/04/1946 [id number]  Reason for visit: Follow up elevated PSA, BPH/chronic incomplete bladder emptying, ED  History: Previously followed by Dr. Nieves, transition care 2022 to [facility] urology to be closer to home Chronic incomplete emptying PVRs ranging 200-785ml but minimally symptomatic, normal renal function, has deferred any outlet procedures and has not followed up with renal ultrasound as recommended previously.  Minimal urinary symptoms well-controlled on Flomax  ED that has not improved significantly with Cialis  or Viagra , has declined penile injections  Physical Exam: BP 115/71 (BP Location: Left Arm, Patient Position: Sitting, Cuff Size: Large)   Pulse 89   Ht 6' 1 (1.854 m)   Wt 248 lb 8 oz (112.7 kg)   SpO2 98%   BMI 32.79 kg/m   Imaging/labs: Creatinine July 2025 normal 0.92, eGFR greater than 60  Today: PVR stably elevated at Continues to deny any significant urinary symptoms aside from some frequency in the morning.  Denies any incontinence, UTIs, dysuria, or gross hematuria Remains frustrated with ED-no significant improvement on max dose Cialis  or sildenafil , not interested in penile injections Past the age of routine PSA screening-history of 4 prior negative biopsies by prior urologist, PSA decreased appropriately on dutasteride (had side effects from dutasteride  and discontinued that medication in the past)  Plan:   BPH with incomplete emptying: Minimally symptomatic, on Flomax , we again reviewed risks of retention, hydronephrosis, kidney injury, UTIs.  He remains averse to intervention.  He was amenable to a renal ultrasound this year to rule out silent hydronephrosis.  Normal renal function is reassuring.  Return precautions reviewed extensively ED: No significant improvement on Cialis  or Viagra , he did request refills, not interested in more aggressive treatments like penile injections RTC 1 year renal ultrasound  prior, PVR   Ralph JAYSON Burnet, MD  [facility] [address], [address] [place], [place] [address] [phone]

## 2024-03-22 ENCOUNTER — Ambulatory Visit: Payer: Self-pay | Admitting: Urology

## 2024-03-24 DIAGNOSIS — L84 Corns and callosities: Secondary | ICD-10-CM | POA: Insufficient documentation

## 2024-03-24 DIAGNOSIS — Q667 Congenital pes cavus, unspecified foot: Secondary | ICD-10-CM | POA: Insufficient documentation

## 2024-03-24 NOTE — Progress Notes (Signed)
"  °  Subjective:  Patient ID: Ralph Garrett, male    DOB: October 24, 1945,  MRN: [id number]  Ralph Garrett presents to clinic today for for annual diabetic foot examination, at risk foot care with history of CMT, diabetic neuropathy,  callus(es) of both feet and painful thick toenails that are difficult to trim. Painful toenails interfere with ambulation. Aggravating factors include wearing enclosed shoe gear. Pain is relieved with periodic professional debridement. Painful calluses are aggravated when weightbearing with and without shoegear. Pain is relieved with periodic professional debridement.  Chief Complaint  Patient presents with   Diabetes    He saw Dr. Theophilus in Sept. A1c is 5.7   New problem(s): None.   PCP is Theophilus Andrews, Tully GRADE, MD.  Allergies[1]  Review of Systems: Negative except as noted in the HPI.  Objective: No changes noted in today's physical examination. There were no vitals filed for this visit. Ralph Garrett is a pleasant 78 y.o. male in NAD. AAO x 3.  . Diabetic foot exam was performed with the following findings:   Vascular Examination: Capillary refill time immediate b/l. Palpable pedal pulses. Pedal hair present b/l. Pedal edema absent. No pain with calf compression b/l. Skin temperature gradient WNL b/l. No cyanosis or clubbing b/l. No ischemia or gangrene noted b/l.   Neurological Examination: Sensation grossly intact b/l with 10 gram monofilament. Vibratory sensation intact b/l. Pt has subjective symptoms of neuropathy.  Dermatological Examination: Pedal skin with normal turgor, texture and tone b/l.  No open wounds. No interdigital macerations.   Toenails 1-5 b/l thick, discolored, elongated with subungual debris and pain on dorsal palpation.   Hyperkeratotic lesion(s) submet head 1 b/l and submet head 5 left foot.  No erythema, no edema, no drainage, no fluctuance.  Musculoskeletal Examination: Muscle strength 5/5 to all lower extremity  muscle groups bilaterally. Pes cavus deformity noted b/l feet.  Radiographs: None    Assessment/Plan: 1. Pain due to onychomycosis of toenails of both feet   2. Callus   3. Charcot-Marie-Tooth disease   4. Pes cavus   5. Diabetic peripheral neuropathy associated with type 2 diabetes mellitus (HCC)   6. Encounter for diabetic foot exam [facility])   Consent given for treatment.Diabetic foot examination performed today. All patient's and/or POA's questions/concerns addressed on today's visit. toenails 1-5 bilaterally debrided in length and girth without incident. Callus(es) submet head 1 b/l and submet head 5 left foot pared with sharp debridement without incident. Continue foot and shoe inspections daily. Monitor blood glucose per PCP/Endocrinologist's recommendations. Continue soft, supportive shoe gear daily. Report any pedal injuries to medical professional. Call office if there are any questions/concerns.  Return in about 3 months (around 06/17/2024).  Delon LITTIE Merlin, DPM      [place] LOCATION: [address], [place] [address]                   Office [phone]   [facility] LOCATION: [address] [place], [place] [address] Office [phone]     [1]  Allergies Allergen Reactions   Penicillins Swelling and Dermatitis    REACTION: rash as a child   "

## 2024-05-04 ENCOUNTER — Other Ambulatory Visit: Payer: Self-pay | Admitting: Internal Medicine

## 2024-05-04 DIAGNOSIS — E1169 Type 2 diabetes mellitus with other specified complication: Secondary | ICD-10-CM

## 2024-06-18 ENCOUNTER — Ambulatory Visit: Admitting: Podiatry

## 2024-07-02 ENCOUNTER — Ambulatory Visit: Admitting: Internal Medicine

## 2025-03-06 ENCOUNTER — Ambulatory Visit

## 2025-03-21 ENCOUNTER — Ambulatory Visit: Admitting: Urology
